# Patient Record
Sex: Male | Born: 1965 | Hispanic: Yes | Smoking: Former smoker
Health system: Southern US, Community
[De-identification: ages and names within clinical notes are randomized; demographics above are authoritative.]

## PROBLEM LIST (undated history)

## (undated) DIAGNOSIS — J439 Emphysema, unspecified: Secondary | ICD-10-CM

## (undated) DIAGNOSIS — M19042 Primary osteoarthritis, left hand: Secondary | ICD-10-CM

## (undated) DIAGNOSIS — I209 Angina pectoris, unspecified: Secondary | ICD-10-CM

## (undated) DIAGNOSIS — M19041 Primary osteoarthritis, right hand: Secondary | ICD-10-CM

## (undated) DIAGNOSIS — R0600 Dyspnea, unspecified: Secondary | ICD-10-CM

## (undated) DIAGNOSIS — E7800 Pure hypercholesterolemia, unspecified: Secondary | ICD-10-CM

## (undated) DIAGNOSIS — J45909 Unspecified asthma, uncomplicated: Secondary | ICD-10-CM

## (undated) DIAGNOSIS — C61 Malignant neoplasm of prostate: Secondary | ICD-10-CM

## (undated) DIAGNOSIS — M1990 Unspecified osteoarthritis, unspecified site: Secondary | ICD-10-CM

## (undated) DIAGNOSIS — E785 Hyperlipidemia, unspecified: Secondary | ICD-10-CM

## (undated) DIAGNOSIS — Z87891 Personal history of nicotine dependence: Secondary | ICD-10-CM

## (undated) DIAGNOSIS — J449 Chronic obstructive pulmonary disease, unspecified: Secondary | ICD-10-CM

## (undated) DIAGNOSIS — K219 Gastro-esophageal reflux disease without esophagitis: Secondary | ICD-10-CM

## (undated) DIAGNOSIS — R7303 Prediabetes: Secondary | ICD-10-CM

## (undated) DIAGNOSIS — J189 Pneumonia, unspecified organism: Secondary | ICD-10-CM

## (undated) HISTORY — DX: Gastro-esophageal reflux disease without esophagitis: K21.9

## (undated) HISTORY — DX: Personal history of nicotine dependence: Z87.891

## (undated) HISTORY — DX: Primary osteoarthritis, left hand: M19.042

## (undated) HISTORY — PX: HEMORRHOID SURGERY: SHX153

## (undated) HISTORY — DX: Unspecified osteoarthritis, unspecified site: M19.90

## (undated) HISTORY — DX: Chronic obstructive pulmonary disease, unspecified: J44.9

---

## 1983-07-18 HISTORY — PX: HEMORRHOID SURGERY: SHX153

## 2005-11-16 ENCOUNTER — Ambulatory Visit: Payer: Self-pay | Admitting: *Deleted

## 2005-11-16 ENCOUNTER — Ambulatory Visit: Payer: Self-pay | Admitting: Family Medicine

## 2006-11-12 ENCOUNTER — Ambulatory Visit: Payer: Self-pay | Admitting: Family Medicine

## 2007-04-03 ENCOUNTER — Encounter (INDEPENDENT_AMBULATORY_CARE_PROVIDER_SITE_OTHER): Payer: Self-pay | Admitting: *Deleted

## 2007-07-22 ENCOUNTER — Ambulatory Visit: Payer: Self-pay | Admitting: Internal Medicine

## 2007-07-22 ENCOUNTER — Encounter (INDEPENDENT_AMBULATORY_CARE_PROVIDER_SITE_OTHER): Payer: Self-pay | Admitting: Family Medicine

## 2008-01-24 ENCOUNTER — Emergency Department (HOSPITAL_COMMUNITY): Admission: EM | Admit: 2008-01-24 | Discharge: 2008-01-24 | Payer: Self-pay | Admitting: Emergency Medicine

## 2008-03-17 ENCOUNTER — Ambulatory Visit: Payer: Self-pay | Admitting: Internal Medicine

## 2008-03-17 ENCOUNTER — Encounter (INDEPENDENT_AMBULATORY_CARE_PROVIDER_SITE_OTHER): Payer: Self-pay | Admitting: Family Medicine

## 2008-03-31 ENCOUNTER — Ambulatory Visit: Payer: Self-pay | Admitting: Internal Medicine

## 2008-04-15 ENCOUNTER — Ambulatory Visit: Payer: Self-pay | Admitting: Internal Medicine

## 2008-09-02 ENCOUNTER — Encounter (INDEPENDENT_AMBULATORY_CARE_PROVIDER_SITE_OTHER): Payer: Self-pay | Admitting: Adult Health

## 2008-09-02 ENCOUNTER — Ambulatory Visit: Payer: Self-pay | Admitting: Internal Medicine

## 2008-09-02 LAB — CONVERTED CEMR LAB
ALT: 34 units/L (ref 0–53)
AST: 25 units/L (ref 0–37)
Albumin: 4.9 g/dL (ref 3.5–5.2)
BUN: 19 mg/dL (ref 6–23)
Basophils Relative: 1 % (ref 0–1)
CO2: 25 meq/L (ref 19–32)
Calcium: 9.7 mg/dL (ref 8.4–10.5)
Chloride: 105 meq/L (ref 96–112)
Cholesterol: 217 mg/dL — ABNORMAL HIGH (ref 0–200)
Creatinine, Ser: 0.86 mg/dL (ref 0.40–1.50)
HCT: 47.3 % (ref 39.0–52.0)
HDL: 47 mg/dL (ref >39)
Lymphs Abs: 1.7 10*3/uL (ref 0.7–4.0)
MCV: 91.8 fL (ref 78.0–100.0)
PSA: 0.45 ng/mL (ref 0.10–4.00)
Platelets: 175 10*3/uL (ref 150–400)
Potassium: 4.8 meq/L (ref 3.5–5.3)
RDW: 13.3 % (ref 11.5–15.5)
Total CHOL/HDL Ratio: 4.6

## 2008-09-07 ENCOUNTER — Ambulatory Visit (HOSPITAL_COMMUNITY): Admission: RE | Admit: 2008-09-07 | Discharge: 2008-09-07 | Payer: Self-pay | Admitting: Adult Health

## 2009-01-08 ENCOUNTER — Emergency Department (HOSPITAL_COMMUNITY): Admission: EM | Admit: 2009-01-08 | Discharge: 2009-01-08 | Payer: Self-pay | Admitting: Emergency Medicine

## 2009-03-05 ENCOUNTER — Ambulatory Visit: Payer: Self-pay | Admitting: Family Medicine

## 2009-03-05 ENCOUNTER — Telehealth (INDEPENDENT_AMBULATORY_CARE_PROVIDER_SITE_OTHER): Payer: Self-pay | Admitting: *Deleted

## 2009-03-10 ENCOUNTER — Ambulatory Visit: Payer: Self-pay | Admitting: Internal Medicine

## 2009-03-10 ENCOUNTER — Encounter (INDEPENDENT_AMBULATORY_CARE_PROVIDER_SITE_OTHER): Payer: Self-pay | Admitting: Internal Medicine

## 2009-03-10 LAB — CONVERTED CEMR LAB
Hemoglobin: 14.7 g/dL (ref 13.0–17.0)
Lymphs Abs: 1.6 10*3/uL (ref 0.7–4.0)
Monocytes Absolute: 0.5 10*3/uL (ref 0.1–1.0)
Monocytes Relative: 10 % (ref 3–12)
Neutro Abs: 3.3 10*3/uL (ref 1.7–7.7)
Neutrophils Relative %: 59 % (ref 43–77)
RBC: 4.74 M/uL (ref 4.22–5.81)
WBC: 5.5 10*3/uL (ref 4.0–10.5)

## 2009-03-11 ENCOUNTER — Encounter (INDEPENDENT_AMBULATORY_CARE_PROVIDER_SITE_OTHER): Payer: Self-pay | Admitting: Internal Medicine

## 2010-03-18 ENCOUNTER — Encounter: Admission: RE | Admit: 2010-03-18 | Discharge: 2010-03-18 | Payer: Self-pay | Admitting: General Practice

## 2010-04-14 ENCOUNTER — Encounter
Admission: RE | Admit: 2010-04-14 | Discharge: 2010-05-10 | Payer: Self-pay | Source: Home / Self Care | Admitting: Unknown Physician Specialty

## 2010-07-18 ENCOUNTER — Emergency Department (HOSPITAL_COMMUNITY)
Admission: EM | Admit: 2010-07-18 | Discharge: 2010-07-18 | Payer: Self-pay | Source: Home / Self Care | Admitting: Emergency Medicine

## 2010-07-29 ENCOUNTER — Emergency Department (HOSPITAL_COMMUNITY)
Admission: EM | Admit: 2010-07-29 | Discharge: 2010-07-29 | Payer: Self-pay | Source: Home / Self Care | Admitting: Emergency Medicine

## 2010-10-24 LAB — POCT I-STAT, CHEM 8
BUN: 19 mg/dL (ref 6–23)
Chloride: 105 mEq/L (ref 96–112)
Creatinine, Ser: 1.1 mg/dL (ref 0.4–1.5)
Potassium: 4 mEq/L (ref 3.5–5.1)
Sodium: 140 mEq/L (ref 135–145)
TCO2: 27 mmol/L (ref 0–100)

## 2011-03-18 ENCOUNTER — Emergency Department (HOSPITAL_COMMUNITY)
Admission: EM | Admit: 2011-03-18 | Discharge: 2011-03-18 | Disposition: A | Discharge status: Home / Self Care | Payer: Self-pay | Attending: Emergency Medicine | Admitting: Emergency Medicine

## 2011-03-18 ENCOUNTER — Emergency Department (HOSPITAL_COMMUNITY): Payer: Self-pay

## 2011-03-18 DIAGNOSIS — M25569 Pain in unspecified knee: Secondary | ICD-10-CM | POA: Insufficient documentation

## 2011-03-18 DIAGNOSIS — E7800 Pure hypercholesterolemia, unspecified: Secondary | ICD-10-CM | POA: Insufficient documentation

## 2015-06-13 ENCOUNTER — Ambulatory Visit (HOSPITAL_COMMUNITY)
Admission: RE | Admit: 2015-06-13 | Discharge: 2015-06-13 | Disposition: A | Discharge status: Home / Self Care | Payer: Self-pay | Source: Ambulatory Visit | Attending: Emergency Medicine | Admitting: Emergency Medicine

## 2015-06-13 ENCOUNTER — Telehealth: Payer: Self-pay | Admitting: *Deleted

## 2015-06-13 ENCOUNTER — Ambulatory Visit (INDEPENDENT_AMBULATORY_CARE_PROVIDER_SITE_OTHER): Payer: Self-pay | Admitting: Emergency Medicine

## 2015-06-13 ENCOUNTER — Ambulatory Visit (INDEPENDENT_AMBULATORY_CARE_PROVIDER_SITE_OTHER): Payer: Self-pay

## 2015-06-13 VITALS — BP 118/62 | HR 77 | Temp 98.5°F | Resp 16 | Ht 73.0 in | Wt 183.0 lb

## 2015-06-13 DIAGNOSIS — R112 Nausea with vomiting, unspecified: Secondary | ICD-10-CM

## 2015-06-13 DIAGNOSIS — R197 Diarrhea, unspecified: Secondary | ICD-10-CM | POA: Insufficient documentation

## 2015-06-13 DIAGNOSIS — R1012 Left upper quadrant pain: Secondary | ICD-10-CM

## 2015-06-13 LAB — AMYLASE: AMYLASE: 35 U/L (ref 0–105)

## 2015-06-13 LAB — POCT CBC
Granulocyte percent: 68.4 %G (ref 37–80)
HCT, POC: 44.4 % (ref 43.5–53.7)
HEMOGLOBIN: 15.7 g/dL (ref 14.1–18.1)
LYMPH, POC: 2 (ref 0.6–3.4)
MCH, POC: 31.8 pg — AB (ref 27–31.2)
MCHC: 35.3 g/dL (ref 31.8–35.4)
MCV: 90 fL (ref 80–97)
MID (cbc): 0.7 (ref 0–0.9)
MPV: 7.7 fL (ref 0–99.8)
POC Granulocyte: 5.9 (ref 2–6.9)
POC LYMPH PERCENT: 23 %L (ref 10–50)
POC MID %: 8.6 %M (ref 0–12)
Platelet Count, POC: 164 10*3/uL (ref 142–424)
RBC: 4.93 M/uL (ref 4.69–6.13)
RDW, POC: 12.5 %
WBC: 8.6 10*3/uL (ref 4.6–10.2)

## 2015-06-13 LAB — POCT URINALYSIS DIP (MANUAL ENTRY)
Glucose, UA: NEGATIVE
Ketones, POC UA: NEGATIVE
LEUKOCYTES UA: NEGATIVE
NITRITE UA: NEGATIVE
PH UA: 5.5
Spec Grav, UA: 1.025
UROBILINOGEN UA: 0.2

## 2015-06-13 LAB — POC MICROSCOPIC URINALYSIS (UMFC)

## 2015-06-13 LAB — COMPLETE METABOLIC PANEL WITH GFR
ALBUMIN: 4.5 g/dL (ref 3.6–5.1)
ALT: 14 U/L (ref 9–46)
AST: 15 U/L (ref 10–35)
Alkaline Phosphatase: 75 U/L (ref 40–115)
BUN: 12 mg/dL (ref 7–25)
CALCIUM: 8.6 mg/dL (ref 8.6–10.3)
CHLORIDE: 105 mmol/L (ref 98–110)
CO2: 29 mmol/L (ref 20–31)
Creat: 0.88 mg/dL (ref 0.70–1.33)
GFR, Est African American: >89 mL/min (ref >=60)
Glucose, Bld: 79 mg/dL (ref 65–99)
POTASSIUM: 4.1 mmol/L (ref 3.5–5.3)
Sodium: 140 mmol/L (ref 135–146)
Total Bilirubin: 0.6 mg/dL (ref 0.2–1.2)
Total Protein: 7.1 g/dL (ref 6.1–8.1)

## 2015-06-13 LAB — LIPASE: LIPASE: 20 U/L (ref 7–60)

## 2015-06-13 LAB — HIV ANTIBODY (ROUTINE TESTING W REFLEX): HIV: NONREACTIVE

## 2015-06-13 LAB — HEPATITIS C ANTIBODY: HCV Ab: NEGATIVE

## 2015-06-13 MED ORDER — HYOSCYAMINE SULFATE 0.125 MG SL SUBL: 0.1250 mg | SUBLINGUAL_TABLET | Freq: Four times a day (QID) | SUBLINGUAL | Status: DC | PRN

## 2015-06-13 MED ORDER — IOHEXOL 300 MG/ML  SOLN
100.0000 mL | Freq: Once | INTRAMUSCULAR | Status: AC | PRN
  Administered 2015-06-13: 100 mL via INTRAVENOUS

## 2015-06-13 NOTE — Progress Notes (Addendum)
This chart was scribed for Arlyss Queen, MD by Moises Blood, Medical Scribe. This patient was seen in Room 1 and the patient's care was started 12:15 PM.  Chief Complaint:  Chief Complaint  Patient presents with  . Abdominal Pain    x 2 days  . Diarrhea    HPI: Jerry Steele is a 49 y.o. male who reports to [facility] today complaining of abd left flank pain for past 2 days with diarrhea.  He believes he ate something bad, because he has a lot of gas and diarrhea. He had a fever last night. He had diarrhea throughout 2 days ago. His last episode of diarrhea was last night. He had corn tortillas and believed it wasn't cooked very well. He had gags and had last episode of vomiting yesterday. He also notes feeling dizzy, "like having the flu". His last food and liquid was 2 days ago. He denies dysuria. He's rarely sick and is usually healthy most of the time. He mentions shx of hemorrhoids and prostate.   He was recommended for a PCP because he states having prostate cancer.  His previous PCP was his license and closed his practice.   He lives alone. He works for himself.   No past medical history on file. No past surgical history on file. Social History   Social History  . Marital Status: Single    Spouse Name: N/A  . Number of Children: N/A  . Years of Education: N/A   Social History Main Topics  . Smoking status: Never Smoker   . Smokeless tobacco: None  . Alcohol Use: None  . Drug Use: None  . Sexual Activity: Not Asked   Other Topics Concern  . None   Social History Narrative  . None   No family history on file. No Known Allergies Prior to Admission medications   Not on File     ROS:  Constitutional: negative for chills, fever, night sweats, weight changes, or fatigue  HEENT: negative for vision changes, hearing loss, congestion, rhinorrhea, ST, epistaxis, or sinus pressure Cardiovascular: negative for chest pain or palpitations Respiratory: negative for  hemoptysis, wheezing, shortness of breath, or cough Abdominal: negative for nausea, or constipation; positive for abd flank pain (left), vomiting, diarrhea Dermatological: negative for rash Neurologic: negative for headache, or syncope; positive for dizziness All other systems reviewed and are otherwise negative with the exception to those above and in the HPI.  PHYSICAL EXAM: Filed Vitals:   06/13/15 1128  BP: 118/62  Pulse: 77  Temp: 98.5 F (36.9 C)  Resp: 16   Body mass index is 24.15 kg/(m^2).   General: Alert, no acute distress HEENT:  Normocephalic, atraumatic, oropharynx patent. Eye: [facility] Cardiovascular:  Regular rate and rhythm, no rubs murmurs or gallops.  No Carotid bruits, radial pulse intact. No pedal edema.  Respiratory: Clear to auscultation bilaterally.  No wheezes, rales, or rhonchi.  No cyanosis, no use of accessory musculature Abdominal: No organomegaly, abd hyperactive bowel sounds, mild left upper abd discomfort, no rebound Musculoskeletal: Gait intact. No edema, tenderness Skin: No rashes. Neurologic: Facial musculature symmetric. Psychiatric: Patient acts appropriately throughout our interaction.  Lymphatic: No cervical or submandibular lymphadenopathy Genitourinary/Anorectal: No acute findings the prostate is small no nodules are felt.   LABS: Results for orders placed or performed in visit on 06/13/15  POCT CBC  Result Value Ref Range   WBC 8.6 4.6 - 10.2 K/uL   Lymph, poc 2.0 0.6 - 3.4  POC LYMPH PERCENT 23.0 10 - 50 %L   MID (cbc) 0.7 0 - 0.9   POC MID % 8.6 0 - 12 %M   POC Granulocyte 5.9 2 - 6.9   Granulocyte percent 68.4 37 - 80 %G   RBC 4.93 4.69 - 6.13 M/uL   Hemoglobin 15.7 14.1 - 18.1 g/dL   HCT, POC 44.4 43.5 - 53.7 %   MCV 90.0 80 - 97 fL   MCH, POC 31.8 (A) 27 - 31.2 pg   MCHC 35.3 31.8 - 35.4 g/dL   RDW, POC 12.5 %   Platelet Count, POC 164 142 - 424 K/uL   MPV 7.7 0 - 99.8 fL  POCT urinalysis dipstick  Result Value  Ref Range   Color, UA yellow yellow   Clarity, UA clear clear   Glucose, UA negative negative   Bilirubin, UA small (A) negative   Ketones, POC UA negative negative   Spec Grav, UA 1.025    Blood, UA small (A) negative   pH, UA 5.5    Protein Ur, POC trace (A) negative   Urobilinogen, UA 0.2    Nitrite, UA Negative Negative   Leukocytes, UA Negative Negative  POCT Microscopic Urinalysis ([facility])  Result Value Ref Range   WBC,UR,HPF,POC Few (A) None WBC/hpf   RBC,UR,HPF,POC None None RBC/hpf   Bacteria None None, Too numerous to count   Mucus Present (A) Absent   Epithelial Cells, UR Per Microscopy Few (A) None, Too numerous to count cells/hpf     EKG/XRAY:   Primary read interpreted by Dr. Everlene Farrier at [facility]. There are multiple air-fluid levels throughout the large and small bowel ASSESSMENT/PLAN: CT scan did not show any acute abnormality. 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 There are no acute findings noted. There is a good amount of stool and gas in the bowel consistent with his history of diarrhea. Levsin sublingual called in.I personally performed the services described in this documentation, which was scribed in my presence. The recorded information has been reviewed and is accurate.  By signing my name below, I, Moises Blood, attest that this documentation has been prepared under the direction and in the presence of Arlyss Queen, MD. Electronically Signed: Moises Blood, [place]. 06/13/2015 , 12:15 PM .    Gross sideeffects, risk and benefits, and alternatives of medications d/w patient. Patient is aware that all medications have potential sideeffects and we are unable to predict every sideeffect or drug-drug interaction that may occur.  Arlyss Queen MD 06/13/2015 12:15 PM

## 2015-06-13 NOTE — Patient Instructions (Signed)
YOU TO GO OVER TO [facility] AND TELL THEM TO REGISTER YOU AS AN OUTPATIENT FOR A CT SCAN.

## 2015-06-13 NOTE — Telephone Encounter (Signed)
No acute findings on ct.  Mild enlargement of portal vein.  Pt advised, will call when liver tests come back.  Dr. Everlene Farrier talked with pt.

## 2015-06-14 LAB — PSA: PSA: 0.57 ng/mL (ref <=4.00)

## 2015-06-15 ENCOUNTER — Telehealth: Payer: Self-pay

## 2015-06-15 NOTE — Telephone Encounter (Signed)
Pt stopped by the office. He is still having diarrhea. Wants to know what the next step would be

## 2015-06-15 NOTE — Telephone Encounter (Signed)
Pt does not want to come back by today. He will come in first thing in the morning to recheck.

## 2015-06-15 NOTE — Telephone Encounter (Signed)
Please put in an ordering collect a gastrointestinal pathogen by PCR. This will help [place] define where the infection is. I also would be happy to see him tomorrow. If he can go ahead and stop by today and pick up the supplies to collect a specimen that would be great.

## 2015-09-28 ENCOUNTER — Ambulatory Visit: Payer: Self-pay

## 2015-09-28 ENCOUNTER — Ambulatory Visit (INDEPENDENT_AMBULATORY_CARE_PROVIDER_SITE_OTHER): Payer: Self-pay | Admitting: Family Medicine

## 2015-09-28 VITALS — BP 130/62 | HR 79 | Temp 98.3°F | Resp 16 | Ht 71.5 in | Wt 189.0 lb

## 2015-09-28 DIAGNOSIS — J111 Influenza due to unidentified influenza virus with other respiratory manifestations: Secondary | ICD-10-CM

## 2015-09-28 DIAGNOSIS — L723 Sebaceous cyst: Secondary | ICD-10-CM

## 2015-09-28 MED ORDER — PSEUDOEPHEDRINE-GUAIFENESIN ER 120-1200 MG PO TB12: 1.0000 | ORAL_TABLET | Freq: Two times a day (BID) | ORAL | Status: DC | PRN

## 2015-09-28 MED ORDER — HYDROCODONE-HOMATROPINE 5-1.5 MG/5ML PO SYRP: 5.0000 mL | ORAL_SOLUTION | Freq: Three times a day (TID) | ORAL | Status: DC | PRN

## 2015-09-28 NOTE — Progress Notes (Signed)
Subjective:    Patient ID: Jerry Steele, male    DOB: 06/25/1963, 50 y.o.   MRN: [id number] Chief Complaint  Patient presents with  . Cough  . Fever  . Generalized Body Aches    HPI He has started having a lot of cough and congestion.  Felt like he was having chest congestion and hard time taking a deep breath over night due to chest tightness. Did have some fevers o/n and chills.  Hurts all over - myalgias.  Pushing fluids, appetite normal.  Once with diarrhea.   Has not had his flu shot last yr.   Has small soft tissue mass on inside of left arm which has not changed and is slightly different color.  He has a small cyst on his back which has drained before.  Has occ sharp momentary twinge of left chest stinginh - more recently resolved.  No past medical history on file. No past surgical history on file. No current outpatient prescriptions on file prior to visit.   No current facility-administered medications on file prior to visit.   No Known Allergies No family history on file. Social History   Social History  . Marital Status: Single    Spouse Name: N/A  . Number of Children: N/A  . Years of Education: N/A   Social History Main Topics  . Smoking status: Never Smoker   . Smokeless tobacco: Not on file  . Alcohol Use: Not on file  . Drug Use: Not on file  . Sexual Activity: Not on file   Other Topics Concern  . Not on file   Social History Narrative  . No narrative on file    Review of Systems  Constitutional: Positive for fever, chills, diaphoresis, activity change, appetite change and fatigue. Negative for unexpected weight change.  HENT: Positive for congestion, ear pain, hearing loss, postnasal drip, rhinorrhea, sinus pressure, sore throat, trouble swallowing and voice change. Negative for facial swelling and sneezing.   Respiratory: Positive for cough, choking and chest tightness. Negative for shortness of breath and wheezing.   Cardiovascular: Positive  for chest pain. Negative for palpitations and leg swelling.  Gastrointestinal: Positive for nausea and diarrhea. Negative for vomiting, abdominal pain and constipation.  Genitourinary: Negative for dysuria.  Musculoskeletal: Positive for myalgias, back pain and arthralgias. Negative for joint swelling, gait problem, neck pain and neck stiffness.  Skin: Negative for color change and rash.  Allergic/Immunologic: Positive for environmental allergies. Negative for immunocompromised state.  Neurological: Positive for headaches. Negative for syncope.  Hematological: Positive for adenopathy.  Psychiatric/Behavioral: Positive for sleep disturbance.       Objective:  BP 130/62 mmHg  Pulse 79  Temp(Src) 98.3 F (36.8 C)  Resp 16  Ht 5' 11.5" (1.816 m)  Wt 189 lb (85.73 kg)  BMI 26.00 kg/m2  Physical Exam  Constitutional: He is oriented to person, place, and time. He appears well-developed and well-nourished. No distress.  HENT:  Head: Normocephalic and atraumatic.  Right Ear: External ear and ear canal normal. Tympanic membrane is injected and retracted. No middle ear effusion.  Left Ear: External ear normal. Tympanic membrane is not retracted.  No middle ear effusion.  Nose: Mucosal edema and rhinorrhea present. Right sinus exhibits maxillary sinus tenderness. Left sinus exhibits maxillary sinus tenderness.  Mouth/Throat: Uvula is midline and mucous membranes are normal. Posterior oropharyngeal erythema (bright red) present. No oropharyngeal exudate or posterior oropharyngeal edema.  Eyes: Conjunctivae are normal. Right eye exhibits no discharge. Left eye  exhibits no discharge. No scleral icterus.  Neck: Normal range of motion. Neck supple. No thyromegaly present.  Cardiovascular: Normal rate, regular rhythm, normal heart sounds and intact distal pulses.   Pulmonary/Chest: Effort normal and breath sounds normal. No respiratory distress.  Lymphadenopathy:       Head (right side):  Submandibular adenopathy present.       Head (left side): Submandibular adenopathy present.    He has no cervical adenopathy.       Right: No supraclavicular adenopathy present.       Left: No supraclavicular adenopathy present.  Neurological: He is alert and oriented to person, place, and time.  Skin: Skin is warm and dry. He is not diaphoretic. No erythema.     Has 2 well-defined soft, slightly mobile soft tissue masses on above areas, no tenderness, warmth, fluctuance, induration, or overlying skin changes.  Psychiatric: He has a normal mood and affect. His behavior is normal.       Assessment & Plan:   1. Influenza with respiratory manifestation - pt with typical sxs of influenza in the midst of a known outbreak and is self-pay - he is healthy without comorbidities and exam reassuring so no need to test and will treat symptomatically for now - reminded that he is likely to cont to feel worse for the next few days.  2. Sebaceous cyst - pt reassured but he really wants this removed on his back despite its benign nature     Meds ordered this encounter  Medications  . HYDROcodone-homatropine (HYCODAN) 5-1.5 MG/5ML syrup    Sig: Take 5 mLs by mouth every 8 (eight) hours as needed for cough.    Dispense:  120 mL    Refill:  0  . Pseudoephedrine-Guaifenesin [phone] MG TB12    Sig: Take 1 tablet by mouth every 12 (twelve) hours as needed.    Dispense:  14 each    Refill:  1     Delman Cheadle, MD MPH

## 2015-09-28 NOTE — Progress Notes (Signed)
Procedure Note:  The patient was placed in prone position with back exposed to visualize sebaceous cyst. The area was cleaned with an alcohol prep pad and 21mL 2% lidocaine/epinephrine was injected into the tissue. The area was then sterilized with two iodine swabs. A 4cm incision was made using a 15 blade scalpel into the tissue, revealing the connective tissue cyst capsule. The cyst was punctured and thick purulent contents were expressed out. The cyst capsule was removed with forceps, curved iris scissors, a hemostat and the 15 blade scalpel. Once the capsule was removed, the skin was placed together using 5-0 P3 Ethilon sutures with two horizontal mattress stiches. A non-adherent bandage and hypafix dressing was placed on top.

## 2015-09-28 NOTE — Patient Instructions (Addendum)
IF you received an x-ray today, you will receive an invoice from [facility]. Please contact [facility] at [phone] with questions or concerns regarding your invoice.   IF you received labwork today, you will receive an invoice from [employer]. Please contact [facility] at [phone] with questions or concerns regarding your invoice.   Our billing staff will not be able to assist you with questions regarding bills from these companies.  You will be contacted with the lab results as soon as they are available. The fastest way to get your results is to activate your My Chart account. Instructions are located on the last page of this paperwork. If you have not heard from [place] regarding the results in 2 weeks, please contact this office.  It is going to be 4-5 days until you start feeling better and this will likely last for 7-10 days.  I recommend frequent warm salt water gargles, hot tea with honey and lemon, rest, and handwashing.  Hot showers or breathing in steam may help loosen the congestion.  Try a netti pot or sinus rinse is also likely to help you feel better and keep this from progressing.   Influenza, Adult Influenza ("the flu") is a viral infection of the respiratory tract. It occurs more often in winter months because people spend more time in close contact with one another. Influenza can make you feel very sick. Influenza easily spreads from person to person (contagious). CAUSES  Influenza is caused by a virus that infects the respiratory tract. You can catch the virus by breathing in droplets from an infected person's cough or sneeze. You can also catch the virus by touching something that was recently contaminated with the virus and then touching your mouth, nose, or eyes. RISKS AND COMPLICATIONS You may be at risk for a more severe case of influenza if you smoke cigarettes, have diabetes, have chronic heart disease (such as heart failure)  or lung disease (such as asthma), or if you have a weakened immune system. Elderly people and pregnant women are also at risk for more serious infections. The most common problem of influenza is a lung infection (pneumonia). Sometimes, this problem can require emergency medical care and may be life threatening. SIGNS AND SYMPTOMS  Symptoms typically last 4 to 10 days and may include:  Fever.  Chills.  Headache, body aches, and muscle aches.  Sore throat.  Chest discomfort and cough.  Poor appetite.  Weakness or feeling tired.  Dizziness.  Nausea or vomiting. DIAGNOSIS  Diagnosis of influenza is often made based on your history and a physical exam. A nose or throat swab test can be done to confirm the diagnosis. TREATMENT  In mild cases, influenza goes away on its own. Treatment is directed at relieving symptoms. For more severe cases, your health care provider may prescribe antiviral medicines to shorten the sickness. Antibiotic medicines are not effective because the infection is caused by a virus, not by bacteria. HOME CARE INSTRUCTIONS  Take medicines only as directed by your health care provider.  Use a cool mist humidifier to make breathing easier.  Get plenty of rest until your temperature returns to normal. This usually takes 3 to 4 days.  Drink enough fluid to keep your urine clear or pale yellow.  Cover yourmouth and nosewhen coughing or sneezing,and wash your handswellto prevent thevirusfrom spreading.  Stay homefromwork orschool untilthe fever is gonefor at least 25full day. PREVENTION  An annual influenza vaccination (flu shot) is the best  way to avoid getting influenza. An annual flu shot is now routinely recommended for all adults in the [place] IF:  You experiencechest pain, yourcough worsens,or you producemore mucus.  Youhave nausea,vomiting, ordiarrhea.  Your fever returns or gets worse. SEEK IMMEDIATE MEDICAL CARE  IF:  You havetrouble breathing, you become short of breath,or your skin ornails becomebluish.  You have severe painor stiffnessin the neck.  You develop a sudden headache, or pain in the face or ear.  You have nausea or vomiting that you cannot control. MAKE SURE YOU:   Understand these instructions.  Will watch your condition.  Will get help right away if you are not doing well or get worse.   This information is not intended to replace advice given to you by your health care provider. Make sure you discuss any questions you have with your health care provider.   Document Released: 06/30/2000 Document Revised: 07/24/2014 Document Reviewed: 10/02/2011 Elsevier Interactive Patient Education 2016 [employer].   Sebaceous Cyst Removal Sebaceous cyst removal is a procedure to remove a sac of oily material that forms under your skin (sebaceous cyst). Sebaceous cysts may also be called epidermoid cysts or keratin cysts. Normally, the skin secretes this oily material through a gland or a hair follicle. This type of cyst usually results when a skin gland or hair follicle becomes blocked. You may need this procedure if you have a sebaceous cyst that becomes large, uncomfortable, or infected. LET [facility] CARE PROVIDER KNOW ABOUT:  Any allergies you have.  All medicines you are taking, including vitamins, herbs, eye drops, creams, and over-the-counter medicines.  Previous problems you or members of your family have had with the use of anesthetics.  Any blood disorders you have.  Previous surgeries you have had.  Medical conditions you have. RISKS AND COMPLICATIONS Generally, this is a safe procedure. However, problems may occur, including:  Developing another cyst.  Bleeding.  Infection.  Scarring. BEFORE THE PROCEDURE  Ask your health care provider about:  Changing or stopping your regular medicines. This is especially important if you are taking diabetes  medicines or blood thinners.  Taking medicines such as aspirin and ibuprofen. These medicines can thin your blood. Do not take these medicines before your procedure if your health care provider instructs you not to.  If you have an infected cyst, you may have to take antibiotic medicines before or after the cyst removal. Take your antibiotics as directed by your health care provider. Finish all of the medicine even if you start to feel better.  Take a shower on the morning of your procedure. Your health care provider may ask you to use a germ-killing (antiseptic) soap. PROCEDURE  You will be given a medicine that numbs the area (local anesthetic).  The skin around the cyst will be cleaned with a germ-killing solution (antiseptic).  Your health care provider will make a small surgical incision over the cyst.  The cyst will be separated from the surrounding tissues that are under your skin.  If possible, the cyst will be removed undamaged (intact).  If the cyst bursts (ruptures), it will need to be removed in pieces.  After the cyst is removed, your health care provider will control any bleeding and close the incision with small stitches (sutures). Small incisions may not need sutures, and the bleeding will be controlled by applying direct pressure with gauze.  Your health care provider may apply antibiotic ointment and a light bandage (dressing) over the  incision. This procedure may vary among health care providers and hospitals. AFTER THE PROCEDURE  If your cyst ruptured during surgery, you may need to take antibiotic medicine. If you were prescribed an antibiotic medicine, finish all of it even if you start to feel better.   This information is not intended to replace advice given to you by your health care provider. Make sure you discuss any questions you have with your health care provider.   Document Released: 06/30/2000 Document Revised: 07/24/2014 Document Reviewed:  03/18/2014 Elsevier Interactive Patient Education [employer].

## 2015-10-04 ENCOUNTER — Ambulatory Visit (INDEPENDENT_AMBULATORY_CARE_PROVIDER_SITE_OTHER): Payer: Self-pay | Admitting: Internal Medicine

## 2015-10-04 VITALS — BP 110/80 | HR 59 | Temp 98.1°F | Resp 17 | Ht 73.0 in | Wt 185.1 lb

## 2015-10-04 DIAGNOSIS — J4520 Mild intermittent asthma, uncomplicated: Secondary | ICD-10-CM

## 2015-10-04 DIAGNOSIS — J0100 Acute maxillary sinusitis, unspecified: Secondary | ICD-10-CM

## 2015-10-04 MED ORDER — PREDNISONE 20 MG PO TABS: ORAL_TABLET | ORAL | Status: DC

## 2015-10-04 MED ORDER — AMOXICILLIN 875 MG PO TABS: 875.0000 mg | ORAL_TABLET | Freq: Two times a day (BID) | ORAL | Status: DC

## 2015-10-04 NOTE — Progress Notes (Signed)
   Subjective:  By signing my name below, I, Jerry Steele, attest that this documentation has been prepared under the direction and in the presence of [name] P. Jerry Pastor, MD. Electronically Signed: Doran Steele, ED Scribe. 10/04/2015. 5:59 PM.   Patient ID: Jerry Steele, male    DOB: 06/25/1963, 50 y.o.   MRN: [id number] Chief Complaint  Patient presents with  . Cough    constant x 1 week. Completed meds from last visit-no help  . Suture / Staple Removal    on lower back   HPI HPI Comments: Jerry Steele is a 49 y.o. male who presents to the [facility] complaining of a cough that began 1 week ago. Pt was seen by a provider for these symptoms and was given medication but he notes no improvement. He states his symptoms began as a dry cough but when he lies down he sometimes has episodes where he has difficulty breathing which then ends in a coughing fit. Pt denies any fevers or any other symptoms at this time.  Pt also reports he is here for a suture/staple removal.  Review of Systems  Respiratory: Positive for cough and shortness of breath.       Objective:   Physical Exam  Constitutional: He is oriented to person, place, and time. He appears well-developed and well-nourished.  HENT:  Head: Normocephalic and atraumatic.  Purulent nasal discharge  Cardiovascular: Normal rate.   Pulmonary/Chest: Effort normal.  Wheezing on forced expiration bilaterally.   Abdominal: He exhibits no distension.  Neurological: He is alert and oriented to person, place, and time.  Skin: Skin is warm and dry.  Psychiatric: He has a normal mood and affect.  Nursing note and vitals reviewed. BP 110/80 mmHg  Pulse 59  Temp(Src) 98.1 F (36.7 C) (Oral)  Resp 17  Ht 6\' 1"  (1.854 m)  Wt 185 lb 2 oz (83.972 kg)  BMI 24.43 kg/m2  SpO2 97%  sut remov from I/D back--well healing w/out infec     Assessment & Plan:  Acute maxillary sinusitis, recurrence not  specified  RAD (reactive airway disease), mild intermittent, uncomplicated  Meds ordered this encounter  Medications  . amoxicillin (AMOXIL) 875 MG tablet    Sig: Take 1 tablet (875 mg total) by mouth 2 (two) times daily.    Dispense:  20 tablet    Refill:  0  . predniSONE (DELTASONE) 20 MG tablet    Sig: 3/3/2/2/1/1 single daily dose for 6 days    Dispense:  12 tablet    Refill:  0    I have completed the patient encounter in its entirety as documented by the scribe, with editing by me where necessary. Elexus Barman P. Jerry Pastor, M.D.

## 2015-10-04 NOTE — Patient Instructions (Signed)
     IF you received an x-ray today, you will receive an invoice from [facility]. Please contact [facility] at [phone] with questions or concerns regarding your invoice.   IF you received labwork today, you will receive an invoice from [facility]. Please contact [facility] at [phone] with questions or concerns regarding your invoice.   Our billing staff will not be able to assist you with questions regarding bills from these companies.  You will be contacted with the lab results as soon as they are available. The fastest way to get your results is to activate your My Chart account. Instructions are located on the last page of this paperwork. If you have not heard from us regarding the results in 2 weeks, please contact this office.      

## 2015-10-08 ENCOUNTER — Telehealth: Payer: Self-pay | Admitting: *Deleted

## 2015-10-08 ENCOUNTER — Ambulatory Visit
Admission: RE | Admit: 2015-10-08 | Discharge: 2015-10-08 | Disposition: A | Discharge status: Home / Self Care | Payer: No Typology Code available for payment source | Source: Ambulatory Visit | Attending: Family Medicine | Admitting: Family Medicine

## 2015-10-08 ENCOUNTER — Ambulatory Visit (INDEPENDENT_AMBULATORY_CARE_PROVIDER_SITE_OTHER): Payer: Self-pay

## 2015-10-08 ENCOUNTER — Ambulatory Visit (INDEPENDENT_AMBULATORY_CARE_PROVIDER_SITE_OTHER): Payer: Self-pay | Admitting: Family Medicine

## 2015-10-08 VITALS — BP 118/78 | HR 58 | Temp 97.7°F | Resp 16 | Ht 73.0 in | Wt 188.0 lb

## 2015-10-08 DIAGNOSIS — R1031 Right lower quadrant pain: Secondary | ICD-10-CM

## 2015-10-08 DIAGNOSIS — K4090 Unilateral inguinal hernia, without obstruction or gangrene, not specified as recurrent: Secondary | ICD-10-CM

## 2015-10-08 DIAGNOSIS — K5900 Constipation, unspecified: Secondary | ICD-10-CM

## 2015-10-08 DIAGNOSIS — J111 Influenza due to unidentified influenza virus with other respiratory manifestations: Secondary | ICD-10-CM

## 2015-10-08 LAB — POC MICROSCOPIC URINALYSIS (UMFC): MUCUS RE: ABSENT

## 2015-10-08 LAB — POCT URINALYSIS DIP (MANUAL ENTRY)
BILIRUBIN UA: NEGATIVE
Blood, UA: NEGATIVE
GLUCOSE UA: NEGATIVE
Ketones, POC UA: NEGATIVE
LEUKOCYTES UA: NEGATIVE
NITRITE UA: NEGATIVE
Protein Ur, POC: NEGATIVE
Spec Grav, UA: 1.015
Urobilinogen, UA: 0.2
pH, UA: 5.5

## 2015-10-08 LAB — POCT CBC
Granulocyte percent: 61 %G (ref 37–80)
HCT, POC: 40.5 % — AB (ref 43.5–53.7)
Hemoglobin: 14.4 g/dL (ref 14.1–18.1)
Lymph, poc: 2 (ref 0.6–3.4)
MCH: 31.6 pg — AB (ref 27–31.2)
MCHC: 35.5 g/dL — AB (ref 31.8–35.4)
MCV: 89 fL (ref 80–97)
MID (CBC): 0.6 (ref 0–0.9)
MPV: 7.7 fL (ref 0–99.8)
POC Granulocyte: 4.1 (ref 2–6.9)
POC LYMPH PERCENT: 29.3 %L (ref 10–50)
POC MID %: 9.7 % (ref 0–12)
Platelet Count, POC: 185 10*3/uL (ref 142–424)
RBC: 4.55 M/uL — AB (ref 4.69–6.13)
RDW, POC: 12.7 %
WBC: 6.7 10*3/uL (ref 4.6–10.2)

## 2015-10-08 LAB — POCT SEDIMENTATION RATE: POCT SED RATE: 14 mm/h (ref 0–22)

## 2015-10-08 MED ORDER — KETOROLAC TROMETHAMINE 60 MG/2ML IM SOLN
60.0000 mg | Freq: Once | INTRAMUSCULAR | Status: AC
  Administered 2015-10-08: 60 mg via INTRAMUSCULAR

## 2015-10-08 MED ORDER — ONDANSETRON 4 MG PO TBDP
4.0000 mg | ORAL_TABLET | Freq: Once | ORAL | Status: AC
  Administered 2015-10-08: 4 mg via ORAL

## 2015-10-08 MED ORDER — IOPAMIDOL (ISOVUE-300) INJECTION 61%
100.0000 mL | Freq: Once | INTRAVENOUS | Status: AC | PRN
  Administered 2015-10-08: 100 mL via INTRAVENOUS

## 2015-10-08 MED ORDER — POLYETHYLENE GLYCOL 3350 17 GM/SCOOP PO POWD: 17.0000 g | Freq: Two times a day (BID) | ORAL | Status: DC

## 2015-10-08 NOTE — Progress Notes (Signed)
Subjective:    Patient ID: Jerry Steele, male    DOB: 06/25/1963, 50 y.o.   MRN: [id number] By signing my name below, I, Zola Button, attest that this documentation has been prepared under the direction and in the presence of Delman Cheadle, MD.  Electronically Signed: Zola Button, Medical Scribe. 10/08/2015. 10:11 AM.  Chief Complaint  Patient presents with  . Abdominal Pain    lower right, radiates to back and down to groin    HPI HPI Comments: Jerry Steele is a 50 y.o. male who presents to the [facility] complaining of constant RLQ abdominal pain radiating to the back and testicle that started yesterday. Patient did have some dysuria last night. He also reports having associated constipation, blood in stool, and feeling gassy. He thinks he may have had a fever last night, but he is unsure. He ate normally last night. Patient denies urinary frequency, diarrhea, pain with bowel movements, nausea and vomiting. He does not feel a bulge. His urine has appeared normal.  He was seen here by Dr. Everlene Farrier 4 months ago for abdominal pain with vomiting and diarrhea. He had a CT scan at that time that showed a mildly prominent 1.7 cm diameter, normal appendix, and moderate stool and gas. His current pain feels different.  Patient was seen here 4 days ago by Dr. Laney Pastor for acute maxillary sinusitis. He still has a cough. He also feels difficulty taking deep breaths. He has been taking the cough medication but without relief.  No past medical history on file. No past surgical history on file. No current outpatient prescriptions on file prior to visit.   No current facility-administered medications on file prior to visit.   No Known Allergies No family history on file. Social History   Social History  . Marital Status: Single    Spouse Name: N/A  . Number of Children: N/A  . Years of Education: N/A   Social History Main Topics  . Smoking status: Never  Smoker   . Smokeless tobacco: None  . Alcohol Use: None  . Drug Use: None  . Sexual Activity: Not Asked   Other Topics Concern  . None   Social History Narrative    Review of Systems  Constitutional: Negative for appetite change.  Respiratory: Positive for cough.   Gastrointestinal: Positive for abdominal pain, constipation and blood in stool. Negative for nausea, vomiting, diarrhea and rectal pain.  Genitourinary: Positive for dysuria and testicular pain. Negative for frequency.  Musculoskeletal: Positive for back pain.       Objective:  BP 118/78 mmHg  Pulse 58  Temp(Src) 97.7 F (36.5 C)  Resp 16  Ht 6\' 1"  (1.854 m)  Wt 188 lb (85.276 kg)  BMI 24.81 kg/m2  SpO2 98%  Physical Exam  Constitutional: He is oriented to person, place, and time. He appears well-developed and well-nourished. No distress.  HENT:  Head: Normocephalic and atraumatic.  Mouth/Throat: Oropharynx is clear and moist. No oropharyngeal exudate.  Eyes: Pupils are equal, round, and reactive to light.  Neck: Neck supple.  Cardiovascular: Normal rate.   Pulmonary/Chest: Effort normal. He has wheezes. He has rales.  Inspiratory rales and expiratory wheezes, right lower lobe.  Abdominal: Soft. He exhibits no distension. There is no hepatosplenomegaly. There is tenderness. There is tenderness at McBurney's point. There is no rebound, no guarding, no CVA tenderness and negative Murphy's sign. A hernia is present. Hernia confirmed positive in the right inguinal area.  Hyperactive bowel  sounds. Referred pain from left lower quadrant to right.  Genitourinary: Testes normal.  Normal testicle exam. Positive indirect right inguinal hernia.  Musculoskeletal: He exhibits no edema.  Neurological: He is alert and oriented to person, place, and time. No cranial nerve deficit.  Skin: Skin is warm and dry. No rash noted.  Psychiatric: He has a normal mood and affect. His behavior is normal.  Nursing note and vitals  reviewed.  Ct Abdomen Pelvis W Contrast  10/08/2015  CLINICAL DATA:  Right sided pain for 2 days, patient was picking up a dog crate and felt something tear in right sided pelvic area Right inguinal hernia Constipation Hx of prostate cancer with chemo, prev in pacs, hx of kidney stones EXAM: CT ABDOMEN AND PELVIS WITH CONTRAST TECHNIQUE: Multidetector CT imaging of the abdomen and pelvis was performed using the standard protocol following bolus administration of intravenous contrast. CONTRAST:  125mL ISOVUE-300 IOPAMIDOL (ISOVUE-300) INJECTION 61% COMPARISON:  None. FINDINGS: Lower chest: Lung bases are clear. Hepatobiliary: No focal hepatic lesion. No biliary duct dilatation. Gallbladder is normal. Common bile duct is normal. Pancreas: Pancreas is normal. No ductal dilatation. No pancreatic inflammation. Spleen: Normal spleen Adrenals/urinary tract: Adrenal glands and kidneys are normal. The ureters and bladder normal. Stomach/Bowel: Stomach, small bowel, appendix, and cecum are normal. The colon and rectosigmoid colon are normal. Vascular/Lymphatic: Abdominal aorta is normal caliber. There is no retroperitoneal or periportal lymphadenopathy. No pelvic lymphadenopathy. Reproductive: Normal prostate Other: Small fat filled inguinal hernias. Musculoskeletal: No aggressive osseous lesion. IMPRESSION: 1. No explanation for RIGHT-sided pain. 2. Small fat filled inguinal hernias. Electronically Signed   By: Suzy Bouchard M.D.   On: 10/08/2015 15:36   Dg Abd Acute W/chest  10/08/2015  CLINICAL DATA:  Right lower quadrant pain and suprapubic pain radiating into the right groin and testicle. EXAM: DG ABDOMEN ACUTE W/ 1V CHEST COMPARISON:  None. FINDINGS: There is no evidence of dilated bowel loops or free intraperitoneal air. There are multiple calcifications in the pelvis, most likely phleboliths. Heart size and mediastinal contours are within normal limits. Both lungs are clear. IMPRESSION: Calcifications in the  pelvis most likely phleboliths. Otherwise benign appearing abdomen. Electronically Signed   By: Lorriane Shire M.D.   On: 10/08/2015 11:08       Assessment & Plan:   1. Suprapubic pain, acute, right   2. Influenza with respiratory manifestation   3. Right inguinal hernia   4. Constipation, unspecified constipation type     Orders Placed This Encounter  Procedures  . DG Abd Acute W/Chest    Standing Status: Future     Number of Occurrences: 1     Standing Expiration Date: 10/07/2016    Order Specific Question:  Reason for exam:    Answer:  right lower quadrant pain radiating to testicle, suspect right inguinal heria    Order Specific Question:  Preferred imaging location?    Answer:  External  . CT Abdomen Pelvis W Contrast    WT-188/NOT DIAB/NO KIDNEY PROBLEMS/ NO NEEDS/NKDA TO CT IV/ INS-SELF PAY [facility] [phone] EPIC ORDER OFFICE TO GIVE PT ORAL CM    Standing Status: Future     Number of Occurrences: 1     Standing Expiration Date: 01/07/2017    Order Specific Question:  If indicated for the ordered procedure, I authorize the administration of contrast media per Radiology protocol    Answer:  Yes    Order Specific Question:  Reason for Exam (SYMPTOM  OR DIAGNOSIS REQUIRED)  Answer:  concern for incarcerated right inguinal hernia    Order Specific Question:  Preferred imaging location?    Answer:  External  . Ambulatory referral to General Surgery    Referral Priority:  Urgent    Referral Type:  Surgical    Referral Reason:  Specialty Services Required    Requested Specialty:  General Surgery    Number of Visits Requested:  1  . POCT CBC  . POCT urinalysis dipstick  . POCT Microscopic Urinalysis (UM[facility])  . POCT SEDIMENTATION RATE    Meds ordered this encounter  Medications  . ondansetron (ZOFRAN-ODT) disintegrating tablet 4 mg    Sig:   . polyethylene glycol powder (GLYCOLAX/MIRALAX) powder    Sig: Take 17 g by mouth 2 (two) times daily.    Dispense:   500 g    Refill:  1  . ketorolac (TORADOL) injection 60 mg    Sig:     I personally performed the services described in this documentation, which was scribed in my presence. The recorded information has been reviewed and considered, and addended by me as needed.  Delman Cheadle, MD MPH

## 2015-10-08 NOTE — Telephone Encounter (Signed)
lmom about appt at [facility] at 12pm with Dr. Dalbert Batman.  Needs to be there at 1130 with picture id, med list, and $166.60.

## 2015-10-08 NOTE — Patient Instructions (Addendum)
I recommend doing a miralax clean-out. Put 14 (not kidding) doses of miralax (polyethylene glycol) into 64 oz of any clear, non-carbonated liquid (apple juice, gatorade, water) and drink this WITHIN 24 HOURS!!!! For the next 2d, plan to stay home and just hang around the toilet as you will hopefully be having 8 BM/day of LIQUID stool.  If the diarrhea occurs soon after starting process without passing a significant stool volume or if you are having any fecal incontinence you should keep going as this may be the initial miralax washing around the larger stools.      IF you received an x-ray today, you will receive an invoice from [facility]. Please contact [facility] at [phone] with questions or concerns regarding your invoice.   IF you received labwork today, you will receive an invoice from [employer]. Please contact [facility] at [phone] with questions or concerns regarding your invoice.   Our billing staff will not be able to assist you with questions regarding bills from these companies.  You will be contacted with the lab results as soon as they are available. The fastest way to get your results is to activate your My Chart account. Instructions are located on the last page of this paperwork. If you have not heard from [place] regarding the results in 2 weeks, please contact this office.     Hernia inguinal - Adultos  (Inguinal Hernia, Adult)  Los msculos mantienen todos los rganos del cuerpo en Geneticist, molecular. Pero si se produce un punto dbil [employer], algunos pueden protruir. Eso se llama hernia. Cuando esto sucede en la parte inferior del vientre (abdomen), se trata de una hernia inguinal. (Toma su nombre de una parte del cuerpo que en esta regin se llamada canal inguinal). Un punto dbil en la pared de los msculos deja que un poco de grasa o parte del intestino delgado salgan hacia afuera. Una hernia inguinal puede  desarrollarse a cualquier edad. Los hombres la sufren con ms frecuencia que las mujeres.  CAUSAS  En los adultos, la hernia inguinal desarrolla con el tiempo.   Las causas pueden ser:  Un esfuerzo sbito de los msculos de la parte inferior del abdomen.  Levantar objetos pesados.  Dificultad para mover el intestino. La dificultad para mover el intestino (constipacin) puede llevar a una hernia.  Tos constante. La causa puede ser el tabaquismo o una enfermedad pulmonar.  Tener sobrepeso.  El [place]ol[place].  Tener un empleo que requiera Equities trader perodos de pie o levantar objetos pesados.  Haber sufrido de una hernia inguinal anteriormente. En algunos casos puede convertirse en una situacin de Freight forwarder. Cuando esto ocurre, se llama hernia inguinal estrangulada. Se produce cuando una parte del intestino delgado se desliza a travs del punto dbil y no puede volver al abdomen. El flujo de [place] puede interrumpirse. Si esto ocurre, una parte del intestino puede morir. Esta situacin requiere [place].  SNTOMAS  Generalmente una hernia inguinal pequea no tiene sntomas. Se diagnostica cuando un profesional de la salud hace un examen fsico. Las hernias ms grandes generalmente presentan sntomas.   En los adultos, los sntomas incluyen:  Un bulto en la ingle. Es fcil de Hydrographic surveyor cuando la persona est de pie. Puede desaparecer al Pershing Proud.  Los hombres pueden tener un bulto Geographical information systems officer.  Dolor o ardor en la ingle. Esto ocurre especialmente al levantar objetos, realizar un esfuerzo o toser.  Dolor sordo o sensacin de presin  en la ingle.  Los signos de una hernia estrangulada pueden ser:  Ardelia Mems protuberancia en la ingle que duele mucho y est sensible al tacto.  Un bulto que se vuelve de color rojo o prpura.  Cristy Hilts, nuseas y vmitos.  Imposibilidad de evacuar el intestino o de eliminar gases. DIAGNSTICO  Para diagnosticar una hernia  inguinal, el profesional le har un examen fsico.   Incluir preguntas acerca de los sntomas que haya notado.  El mdico palpar el rea de la ingle y le pedir que tosa. Si palpa una hernia inguinal, el mdico podr tratar de deslizarla de nuevo hacia adentro el abdomen.  Por lo general no se necesitan otros estudios. TRATAMIENTO  Los tratamientos Emergency planning/management officer. Dependern del tamao de la hernia. Las opciones incluyen:   Observacin cuidadosa. Esto a menudo se sugiere si la hernia es pequea y usted no ha tenido sntomas.  No se realizar ningn procedimiento mdico excepto que aparezcan sntomas.  Tendr que prestar atencin a los sntomas. Si tiene sntomas, comunquese con su mdico de inmediato.  Ciruga. Se realiza si la hernia es grande o si tiene sntomas.  Ciruga abierta. Por lo general, este es un procedimiento ambulatorio (no tendr que pasar la noche en el hospital). Se realiza un corte (incisin) a travs de la piel de la ingle. La hernia se vuelve a colocar en el interior del abdomen. Luego se repara la zona dbil en los msculos con una herniorrafia o hernioplastia. Herniorrafa: en este tipo de [place], se suturan juntos los msculos dbiles. Hernioplasta: se coloca un parche o malla para cerrar el rea dbil en la pared abdominal.  Laparoscopia. En este procedimiento, el cirujano hace incisiones pequeas. Se coloca en el abdomen un tubo delgado con una pequea cmara de video (llamado laparoscopio). El cirujano repara la hernia con [place], observando en una cmara de vdeo y utilizando dos instrumentos largos. INSTRUCCIONES PARA EL CUIDADO EN EL HOGAR   Despus de la ciruga de reparacin de una hernia inguinal:  Necesitar tomar un analgsico para el dolor recetado por su mdico. Siga cuidadosamente todas las indicaciones.  Tendr que cuidar la herida de la incisin.  Deber restringir algunas actividades por un tiempo. Incluir no levantar objetos pesados  durante varias semanas. Tampoco podr hacer nada demasiado activo durante algunas semanas. La vuelta al [place] depender del tipo de trabajo que tenga.  Durante perodos de "espera vigilante", usted debe:  Mantenga un peso saludable.  Consumir una dieta rica en fibra (frutas, verduras y granos enteros).  Beba gran cantidad de lquidos para evitar la constipacin. Esto significa beber suficiente agua y otros lquidos para mantener la orina clara o de color amarillo plido.  No levante objetos pesados.  No permanezca de pie durante largos perodos.  Deje de fumar. Evite toser con frecuencia. SOLICITE ATENCIN MDICA SI:   Aparece una protuberancia en el rea de la ingle.  Siente dolor, tiene sensacin de [place] o de presin en la ingle. Esto podra empeorar si levanta pesos o hace esfuerzos.  Tiene fiebre de ms de 100.5 F (38.1 C). SOLICITE ATENCIN MDICA DE INMEDIATO SI:   El dolor en la ingle aumenta repentinamente.  Una protuberancia en la ingle se hace ms grande y no baja.  En los hombres, un dolor repentino en el escroto. O el escroto aumenta de tamao.  Un bulto en el rea de la ingle se vuelve de color rojo o prpura y es dolorosa al tacto.  Tiene nuseas o vmitos que no  desaparecen.  Siente que su corazn late mucho ms rpido de lo normal.  No puede mover el intestino o eliminar gases.  Tiene fiebre de ms de 102.0 F (38.9 C).   Esta informacin no tiene Marine scientist el consejo del mdico. Asegrese de hacerle al mdico cualquier pregunta que tenga.   Document Released: 10/28/2012 Elsevier Interactive Patient Education 2016 Elsevier Inc.   Full Liquid Diet A full liquid diet may be used:   To help you transition from a clear liquid diet to a soft diet.   When your body is healing and can only tolerate foods that are easy to digest.  Before or after certain a procedure, test, or surgery (such as stomach or intestinal surgeries).   If  you have trouble swallowing or chewing.  A full liquid diet includes fluids and foods that are liquid or will become liquid at room temperature. The full liquid diet gives you the proteins, fluids, salts, and minerals that you need for energy. If you continue this diet for more than 72 hours, talk to your health care provider about how many calories you need to consume. If you continue the diet for more than 5 days, talk to your health care provider about taking a multivitamin or a nutritional supplement. WHAT DO I NEED TO KNOW ABOUT A FULL LIQUID DIET?  You may have any liquid.  You may have any food that becomes a liquid at room temperature. The food is considered a liquid if it can be poured off a spoon at room temperature.  Drink one serving of citrus or vitamin C-enriched fruit juice daily. WHAT FOODS CAN I EAT? Grains Any grain food that can be pureed in soup (such as crackers, pasta, and rice). Hot cereal (such as farina or oatmeal) that has been blended. Talk to your health care provider or dietitian about these foods. Vegetables Pulp-free tomato or vegetable juice. Vegetables pureed in soup.  Fruits Fruit juice, including nectars and juices with pulp. Meats and Other Protein Sources Eggs in custard, eggnog mix, and eggs used in ice cream or pudding. Strained meats, like in baby food, may be allowed. Consult your health care provider.  Dairy Milk and milk-based beverages, including milk shakes and instant breakfast mixes. Smooth yogurt. Pureed cottage cheese. Avoid these foods if they are not well tolerated. Beverages All beverages, including liquid nutritional supplements. Ask your health care provider if you can have carbonated beverages. They may not be well tolerated. Condiments Iodized salt, pepper, spices, and flavorings. Cocoa powder. Vinegar, ketchup, yellow mustard, smooth sauces (such as hollandaise, cheese sauce, or white sauce), and soy sauce. Sweets and  Desserts Custard, smooth pudding. Flavored gelatin. Tapioca, junket. Plain ice cream, sherbet, fruit ices. Frozen ice pops, frozen fudge pops, pudding pops, and other frozen bars with cream. Syrups, including chocolate syrup. Sugar, honey, jelly.  Fats and Oils Margarine, butter, cream, sour cream, and oils. Other Broth and cream soups. Strained, broth-based soups. The items listed above may not be a complete list of recommended foods or beverages. Contact your dietitian for more options.  WHAT FOODS CAN I NOT EAT? Grains All breads. Grains are not allowed unless they are pureed into soup. Vegetables Vegetables are not allowed unless they are juiced, or cooked and pureed into soup. Fruits Fruits are not allowed unless they are juiced. Meats and Other Protein Sources Any meat or fish. Cooked or raw eggs. Nut butters.  Dairy Cheese.  Condiments Stone ground mustards. Fats and Oils Fats  that are coarse or chunky. Sweets and Desserts Ice cream or other frozen desserts that have any solids in them or on top, such as nuts, chocolate chips, and pieces of cookies. Cakes. Cookies. Candy. Others Soups with chunks or pieces in them. The items listed above may not be a complete list of foods and beverages to avoid. Contact your dietitian for more information.   This information is not intended to replace advice given to you by your health care provider. Make sure you discuss any questions you have with your health care provider.   Document Released: 07/03/2005 Document Revised: 07/08/2013 Document Reviewed: 05/08/2013 Elsevier Interactive Patient Education 2016 [employer]. Constipation, Adult Constipation is when a person has fewer than three bowel movements a week, has difficulty having a bowel movement, or has stools that are dry, hard, or larger than normal. As people grow older, constipation is more common. A low-fiber diet, not taking in enough fluids, and taking certain medicines may  make constipation worse.  CAUSES   Certain medicines, such as antidepressants, pain medicine, iron supplements, antacids, and water pills.   Certain diseases, such as diabetes, irritable bowel syndrome (IBS), thyroid disease, or depression.   Not drinking enough water.   Not eating enough fiber-rich foods.   Stress or travel.   Lack of physical activity or exercise.   Ignoring the urge to have a bowel movement.   Using laxatives too much.  SIGNS AND SYMPTOMS   Having fewer than three bowel movements a week.   Straining to have a bowel movement.   Having stools that are hard, dry, or larger than normal.   Feeling full or bloated.   Pain in the lower abdomen.   Not feeling relief after having a bowel movement.  DIAGNOSIS  Your health care provider will take a medical history and perform a physical exam. Further testing may be done for severe constipation. Some tests may include:  A barium enema X-ray to examine your rectum, colon, and, sometimes, your small intestine.   A sigmoidoscopy to examine your lower colon.   A colonoscopy to examine your entire colon. TREATMENT  Treatment will depend on the severity of your constipation and what is causing it. Some dietary treatments include drinking more fluids and eating more fiber-rich foods. Lifestyle treatments may include regular exercise. If these diet and lifestyle recommendations do not help, your health care provider may recommend taking over-the-counter laxative medicines to help you have bowel movements. Prescription medicines may be prescribed if over-the-counter medicines do not work.  HOME CARE INSTRUCTIONS   Eat foods that have a lot of fiber, such as fruits, vegetables, whole grains, and beans.  Limit foods high in fat and processed sugars, such as french fries, hamburgers, cookies, candies, and soda.   A fiber supplement may be added to your diet if you cannot get enough fiber from foods.    Drink enough fluids to keep your urine clear or pale yellow.   Exercise regularly or as directed by your health care provider.   Go to the restroom when you have the urge to go. Do not hold it.   Only take over-the-counter or prescription medicines as directed by your health care provider. Do not take other medicines for constipation without talking to your health care provider first.  [place] IF:   You have bright red blood in your stool.   Your constipation lasts for more than 4 days or gets worse.   You have abdominal  or rectal pain.   You have thin, pencil-like stools.   You have unexplained weight loss. MAKE SURE YOU:   Understand these instructions.  Will watch your condition.  Will get help right away if you are not doing well or get worse.   This information is not intended to replace advice given to you by your health care provider. Make sure you discuss any questions you have with your health care provider.   Document Released: 03/31/2004 Document Revised: 07/24/2014 Document Reviewed: 04/14/2013 Elsevier Interactive Patient Education [employer].

## 2015-10-09 ENCOUNTER — Telehealth: Payer: Self-pay

## 2015-10-09 NOTE — Telephone Encounter (Signed)
Pt is calling for i believe from a scan not sure could also be lab results -pt had a language barrier  Best number [phone]

## 2015-10-11 ENCOUNTER — Ambulatory Visit (INDEPENDENT_AMBULATORY_CARE_PROVIDER_SITE_OTHER): Payer: Self-pay | Admitting: Family Medicine

## 2015-10-11 VITALS — BP 110/84 | HR 74 | Temp 98.4°F | Resp 16 | Ht 73.0 in | Wt 183.8 lb

## 2015-10-11 DIAGNOSIS — K5900 Constipation, unspecified: Secondary | ICD-10-CM

## 2015-10-11 DIAGNOSIS — K219 Gastro-esophageal reflux disease without esophagitis: Secondary | ICD-10-CM

## 2015-10-11 MED ORDER — LACTULOSE 20 GM/30ML PO SOLN: 30.0000 mL | Freq: Two times a day (BID) | ORAL | Status: DC

## 2015-10-11 MED ORDER — PANTOPRAZOLE SODIUM 40 MG PO TBEC: 40.0000 mg | DELAYED_RELEASE_TABLET | Freq: Every day | ORAL | Status: DC

## 2015-10-11 NOTE — Progress Notes (Addendum)
Subjective:    Patient ID: Jerry Steele, male    DOB: 06/25/1963, 50 y.o.   MRN: [id number] By signing my name below, I, Zola Button, attest that this documentation has been prepared under the direction and in the presence of Delman Cheadle, MD.  Electronically Signed: Zola Button, Medical Scribe. 10/11/2015. 4:09 PM.  Chief Complaint  Patient presents with  . Follow-up    Psot his visit with [facility], brought in the paper   . needs a referral    paper he brought from [place] wrote that he needs a referral for othropedic    HPI HPI Comments: Jerry Steele is a 50 y.o. male who presents to the [facility] for a follow-up for abdominal pain. He still has intermittent abdominal pain and is still feeling gassy. Patient has been taking the Miralax as instructed, but notes he has not had a significant change in his bowel movements. However, he also has not been eating very much. He eats 1 large meal a day and smaller snacks throughout the day. He has noticed blood with 2 of his bowel movements. Patient has frequent heartburn and has also noticed an occasional sharp pain to his chest. He had been put on a prescription medication (a yellow pill) for this by his former PCP which did help with the heartburn, but he does not remember the name of the medication. He has tried OTC medications which have not been effective. Patient does a lot of heavy lifting for work.  Patient did go to [facility] earlier today. They did not think an inguinal hernia was causing his pain and had advised against hernia surgery at that time. They suspected musculoskeletal strain and was sent here for referral for orthopedics.  History reviewed. No pertinent past medical history. History reviewed. No pertinent past surgical history. Current Outpatient Prescriptions on File Prior to Visit  Medication Sig Dispense Refill  . polyethylene glycol powder  (GLYCOLAX/MIRALAX) powder Take 17 g by mouth 2 (two) times daily. (Patient not taking: Reported on 10/11/2015) 500 g 1   No current facility-administered medications on file prior to visit.   No Known Allergies History reviewed. No pertinent family history. Social History   Social History  . Marital Status: Single    Spouse Name: N/A  . Number of Children: N/A  . Years of Education: N/A   Social History Main Topics  . Smoking status: Never Smoker   . Smokeless tobacco: None  . Alcohol Use: None  . Drug Use: None  . Sexual Activity: Not Asked   Other Topics Concern  . None   Social History Narrative    Review of Systems  Constitutional: Positive for appetite change and fatigue. Negative for fever, chills, diaphoresis and activity change.  HENT: Negative for postnasal drip, sore throat and trouble swallowing.   Respiratory: Negative for shortness of breath.   Cardiovascular: Positive for chest pain (from gerd).  Gastrointestinal: Positive for abdominal pain, constipation and anal bleeding. Negative for nausea, vomiting, diarrhea, blood in stool, abdominal distention and rectal pain.  Genitourinary: Negative for dysuria, frequency and decreased urine volume.  Musculoskeletal: Negative for myalgias, back pain, joint swelling, arthralgias and gait problem.  Neurological: Negative for weakness.  Hematological: Negative for adenopathy. Does not bruise/bleed easily.  Psychiatric/Behavioral: Negative for sleep disturbance.       Objective:  BP 110/84 mmHg  Pulse 74  Temp(Src) 98.4 F (36.9 C) (Oral)  Resp 16  Ht 6'  1" (1.854 m)  Wt 183 lb 12.8 oz (83.371 kg)  BMI 24.25 kg/m2  SpO2 97%  Physical Exam  Constitutional: He is oriented to person, place, and time. He appears well-developed and well-nourished. No distress.  HENT:  Head: Normocephalic and atraumatic.  Mouth/Throat: Oropharynx is clear and moist. No oropharyngeal exudate.  Eyes: Pupils are equal, round, and  reactive to light.  Neck: Neck supple.  Cardiovascular: Normal rate.   Pulmonary/Chest: Effort normal.  Abdominal: Bowel sounds are increased. There is tenderness in the right lower quadrant, epigastric area and left lower quadrant.  Hyperactive bowel sounds. Pain primarily in epigastric region. RLQ and LLQ pain, right worse than left.  Musculoskeletal: He exhibits no edema.  Neurological: He is alert and oriented to person, place, and time. No cranial nerve deficit.  Skin: Skin is warm and dry. No rash noted.  Psychiatric: He has a normal mood and affect. His behavior is normal.  Nursing note and vitals reviewed.         Assessment & Plan:   1. Gastroesophageal reflux disease, esophagitis presence not specified - reports doing well with a yellow pill prior (suspect ppi protonix) and failing purple pill (suspect nexium)  2. Constipation, unspecified constipation type - failed miralax but sounds like pt has been using about 3 doses daily for the past several days with minimal improvement. There is a small language barrier so difficult to know exactly but I think that pt never did 14 dose/24hrs cleanout and that he has not passed the oral contrast that he drank sev d aago.   If sxs cont, RTC for repeat xray to reassess constipation.  Could try enema.  Meds ordered this encounter  Medications  . Lactulose 20 GM/30ML SOLN    Sig: Take 30 mLs (20 g total) by mouth 2 (two) times daily.    Dispense:  500 mL    Refill:  1  . pantoprazole (PROTONIX) 40 MG tablet    Sig: Take 1 tablet (40 mg total) by mouth daily. 30 minutes before breakfast    Dispense:  90 tablet    Refill:  3    I personally performed the services described in this documentation, which was scribed in my presence. The recorded information has been reviewed and considered, and addended by me as needed.  Delman Cheadle, MD MPH

## 2015-10-11 NOTE — Patient Instructions (Addendum)
IF you received an x-ray today, you will receive an invoice from [facility]. Please contact [facility] at [phone] with questions or concerns regarding your invoice.   IF you received labwork today, you will receive an invoice from [employer]. Please contact [facility] at [phone] with questions or concerns regarding your invoice.   Our billing staff will not be able to assist you with questions regarding bills from these companies.  You will be contacted with the lab results as soon as they are available. The fastest way to get your results is to activate your My Chart account. Instructions are located on the last page of this paperwork. If you have not heard from [place] regarding the results in 2 weeks, please contact this office.     Estreimiento - Adultos (Constipation, Adult) Estreimiento significa que una persona tiene menos de tres evacuaciones en una semana, dificultad para defecar, o que las heces son secas, duras, o ms grandes que lo normal. A medida que envejecemos el estreimiento es ms comn. Una dieta baja en fibra, no tomar suficientes lquidos y el uso de ciertos medicamentos pueden Agricultural engineer.  CAUSAS   Ciertos medicamentos, como los antidepresivos, analgsicos, suplementos de hierro, anticidos y diurticos.  Algunas enfermedades, como la diabetes, el sndrome del colon irritable, enfermedad de la tiroides, o depresin.  No beber suficiente agua.  No consumir suficientes alimentos ricos en fibra.  Situaciones de estrs o viajes.  Falta de actividad fsica o de ejercicio.  Ignorar la necesidad sbita de Landscape architect.  Uso en exceso de laxantes. SIGNOS Y SNTOMAS   Defecar menos de tres veces por semana.  Dificultad para defecar.  Tener las heces secas y duras, o ms grandes que las normales.  Sensacin de estar lleno o hinchado.  Dolor en la parte baja del abdomen.  No sentir alivio  despus de defecar. DIAGNSTICO  El mdico le har una historia clnica y un examen fsico. Pueden hacerle exmenes adicionales para el estreimiento grave. Estos estudios pueden ser:  Un radiografa con enema de bario para examinar el recto, el colon y, en algunos casos, el intestino delgado.  Una sigmoidoscopia para examinar el colon inferior.  Una colonoscopia para examinar todo el colon. TRATAMIENTO  El tratamiento depender de la gravedad del estreimiento y de la causa. Algunos tratamientos nutricionales son beber ms lquidos y comer ms alimentos ricos en fibra. El cambio en el estilo de vida incluye hacer ejercicios de [place] regular. Si estas recomendaciones para Animator dieta y en el estilo de vida no ayudan, el mdico le puede indicar el uso de laxantes de venta libre para ayudarlo a Landscape architect. Los medicamentos recetados se pueden prescribir si los medicamentos de venta libre no lo [place].  INSTRUCCIONES PARA EL CUIDADO EN EL HOGAR   Consuma alimentos con alto contenido de [place], como frutas, vegetales, cereales integrales y porotos.  Limite los alimentos procesados ricos en grasas y azcar, como las papas fritas, hamburguesas, galletas, dulces y refrescos.  Puede agregar un suplemento de fibra a su dieta si no obtiene lo suficiente de los alimentos.  Beba suficiente lquido para Consulting civil engineer orina clara o de color amarillo plido.  Haga ejercicio regularmente o segn las indicaciones del mdico.  Vaya al bao cuando sienta la necesidad de ir. No se aguante las ganas.  Tome solo medicamentos de venta libre o recetados, segn las indicaciones del mdico. No tome otros medicamentos para el estreimiento sin consultarlo antes con  su mdico. SOLICITE ATENCIN [place] DE INMEDIATO SI:   Observa sangre brillante en las heces.  El estreimiento dura ms de 4 das o [place].  Siente dolor abdominal o rectal.  Las heces son delgadas como un lpiz.  Pierde peso de  [place] inexplicable. ASEGRESE DE QUE:   Comprende estas instrucciones.  Controlar su afeccin.  Recibir ayuda de inmediato si no mejora o si empeora.   Esta informacin no tiene Marine scientist el consejo del mdico. Asegrese de hacerle al mdico cualquier pregunta que tenga.   Document Released: 07/23/2007 Document Revised: 07/24/2014 Elsevier Interactive Patient Education [employer].

## 2015-10-12 NOTE — Telephone Encounter (Signed)
Please review labs. He was notified about his CT already by Aaron Edelman.

## 2015-10-12 NOTE — Telephone Encounter (Signed)
See chart. i saw pt is office yesterday 3/27. This phone call was from 3/25. . . . Marland Kitchen

## 2016-01-25 ENCOUNTER — Ambulatory Visit (INDEPENDENT_AMBULATORY_CARE_PROVIDER_SITE_OTHER): Payer: Self-pay | Admitting: Family Medicine

## 2016-01-25 VITALS — BP 110/72 | HR 74 | Temp 98.5°F | Resp 16 | Ht 73.0 in | Wt 190.0 lb

## 2016-01-25 DIAGNOSIS — M542 Cervicalgia: Secondary | ICD-10-CM

## 2016-01-25 DIAGNOSIS — S39012A Strain of muscle, fascia and tendon of lower back, initial encounter: Secondary | ICD-10-CM

## 2016-01-25 DIAGNOSIS — G8929 Other chronic pain: Secondary | ICD-10-CM

## 2016-01-25 MED ORDER — CYCLOBENZAPRINE HCL 5 MG PO TABS: ORAL_TABLET | ORAL | Status: DC

## 2016-01-25 MED ORDER — MELOXICAM 7.5 MG PO TABS: 7.5000 mg | ORAL_TABLET | Freq: Every day | ORAL | Status: DC

## 2016-01-25 NOTE — Patient Instructions (Addendum)
IF you received an x-ray today, you will receive an invoice from [facility]. Please contact [facility] at [phone] with questions or concerns regarding your invoice.   IF you received labwork today, you will receive an invoice from [employer]. Please contact [facility] at [phone] with questions or concerns regarding your invoice.   Our billing staff will not be able to assist you with questions regarding bills from these companies.  You will be contacted with the lab results as soon as they are available. The fastest way to get your results is to activate your My Chart account. Instructions are located on the last page of this paperwork. If you have not heard from [place] regarding the results in 2 weeks, please contact this office.    Your back pain appears to be due to a lumbar/low back strain. This should improve with time and other treatment as we discussed. Try the meloxicam 1 per day, Flexeril up to every 8 hours if needed, but initially start this at bedtime as it does make you sleepy. Heat or ice and gentle range of motion as tolerated. Tylenol over-the-counter can be combined with these medications if you would like. If not improving into next week, or worsening sooner, return for recheck.  Neck pain appears to be due to some chronic neck pain, so continue range of motion and if not improving with the treatment for your low back, let me know so  I can refer you to an orthopedist for other treatment.  Low Back Strain With Rehab A strain is an injury in which a tendon or muscle is torn. The muscles and tendons of the lower back are vulnerable to strains. However, these muscles and tendons are very strong and require a great force to be injured. Strains are classified into three categories. Grade 1 strains cause pain, but the tendon is not lengthened. Grade 2 strains include a lengthened ligament, due to the ligament being stretched or  partially ruptured. With grade 2 strains there is still function, although the function may be decreased. Grade 3 strains involve a complete tear of the tendon or muscle, and function is usually impaired. SYMPTOMS   Pain in the lower back.  Pain that affects one side more than the other.  Pain that gets worse with movement and may be felt in the hip, buttocks, or back of the thigh.  Muscle spasms of the muscles in the back.  Swelling along the muscles of the back.  Loss of strength of the back muscles.  Crackling sound (crepitation) when the muscles are touched. CAUSES  Lower back strains occur when a force is placed on the muscles or tendons that is greater than they can handle. Common causes of injury include:  Prolonged overuse of the muscle-tendon units in the lower back, usually from incorrect posture.  A single violent injury or force applied to the back. RISK INCREASES WITH:  Sports that involve twisting forces on the spine or a lot of bending at the waist (football, rugby, weightlifting, bowling, golf, tennis, speed skating, racquetball, swimming, running, gymnastics, diving).  Poor strength and flexibility.  Failure to warm up properly before activity.  Family history of lower back pain or disk disorders.  Previous back injury or surgery (especially fusion).  Poor posture with lifting, especially heavy objects.  Prolonged sitting, especially with poor posture. PREVENTION   Learn and use proper posture when sitting or lifting (maintain proper posture when sitting, lift using the  knees and legs, not at the waist).  Warm up and stretch properly before activity.  Allow for adequate recovery between workouts.  Maintain physical fitness:  Strength, flexibility, and endurance.  Cardiovascular fitness. PROGNOSIS  If treated properly, lower back strains usually heal within 6 weeks. RELATED COMPLICATIONS   Recurring symptoms, resulting in a chronic  problem.  Chronic inflammation, scarring, and partial muscle-tendon tear.  Delayed healing or resolution of symptoms.  Prolonged disability. TREATMENT  Treatment first involves the use of ice and medicine, to reduce pain and inflammation. The use of strengthening and stretching exercises may help reduce pain with activity. These exercises may be performed at home or with a therapist. Severe injuries may require referral to a therapist for further evaluation and treatment, such as ultrasound. Your caregiver may advise that you wear a back brace or corset, to help reduce pain and discomfort. Often, prolonged bed rest results in greater harm then benefit. Corticosteroid injections may be recommended. However, these should be reserved for the most serious cases. It is important to avoid using your back when lifting objects. At night, sleep on your back on a firm mattress with a pillow placed under your knees. If non-surgical treatment is unsuccessful, surgery may be needed.  MEDICATION   If pain medicine is needed, nonsteroidal anti-inflammatory medicines (aspirin and ibuprofen), or other minor pain relievers (acetaminophen), are often advised.  Do not take pain medicine for 7 days before surgery.  Prescription pain relievers may be given, if your caregiver thinks they are needed. Use only as directed and only as much as you need.  Ointments applied to the skin may be helpful.  Corticosteroid injections may be given by your caregiver. These injections should be reserved for the most serious cases, because they may only be given a certain number of times. HEAT AND COLD  Cold treatment (icing) should be applied for 10 to 15 minutes every 2 to 3 hours for inflammation and pain, and immediately after activity that aggravates your symptoms. Use ice packs or an ice massage.  Heat treatment may be used before performing stretching and strengthening activities prescribed by your caregiver, physical  therapist, or athletic trainer. Use a heat pack or a warm water soak. SEEK MEDICAL CARE IF:   Symptoms get worse or do not improve in 2 to 4 weeks, despite treatment.  You develop numbness, weakness, or loss of bowel or bladder function.  New, unexplained symptoms develop. (Drugs used in treatment may produce side effects.) EXERCISES  RANGE OF MOTION (ROM) AND STRETCHING EXERCISES - Low Back Strain Most people with lower back pain will find that their symptoms get worse with excessive bending forward (flexion) or arching at the lower back (extension). The exercises which will help resolve your symptoms will focus on the opposite motion.  Your physician, physical therapist or athletic trainer will help you determine which exercises will be most helpful to resolve your lower back pain. Do not complete any exercises without first consulting with your caregiver. Discontinue any exercises which make your symptoms worse until you speak to your caregiver.  If you have pain, numbness or tingling which travels down into your buttocks, leg or foot, the goal of the therapy is for these symptoms to move closer to your back and eventually resolve. Sometimes, these leg symptoms will get better, but your lower back pain may worsen. This is typically an indication of progress in your rehabilitation. Be very alert to any changes in your symptoms and the activities in  which you participated in the 24 hours prior to the change. Sharing this information with your caregiver will allow him/her to most efficiently treat your condition.  These exercises may help you when beginning to rehabilitate your injury. Your symptoms may resolve with or without further involvement from your physician, physical therapist or athletic trainer. While completing these exercises, remember:  Restoring tissue flexibility helps normal motion to return to the joints. This allows healthier, less painful movement and activity.  An effective  stretch should be held for at least 30 seconds.  A stretch should never be painful. You should only feel a gentle lengthening or release in the stretched tissue. FLEXION RANGE OF MOTION AND STRETCHING EXERCISES: STRETCH - Flexion, Single Knee to Chest   Lie on a firm bed or floor with both legs extended in front of you.  Keeping one leg in contact with the floor, bring your opposite knee to your chest. Hold your leg in place by either grabbing behind your thigh or at your knee.  Pull until you feel a gentle stretch in your lower back. Hold __________ seconds.  Slowly release your grasp and repeat the exercise with the opposite side. Repeat __________ times. Complete this exercise __________ times per day.  STRETCH - Flexion, Double Knee to Chest   Lie on a firm bed or floor with both legs extended in front of you.  Keeping one leg in contact with the floor, bring your opposite knee to your chest.  Tense your stomach muscles to support your back and then lift your other knee to your chest. Hold your legs in place by either grabbing behind your thighs or at your knees.  Pull both knees toward your chest until you feel a gentle stretch in your lower back. Hold __________ seconds.  Tense your stomach muscles and slowly return one leg at a time to the floor. Repeat __________ times. Complete this exercise __________ times per day.  STRETCH - Low Trunk Rotation  Lie on a firm bed or floor. Keeping your legs in front of you, bend your knees so they are both pointed toward the ceiling and your feet are flat on the floor.  Extend your arms out to the side. This will stabilize your upper body by keeping your shoulders in contact with the floor.  Gently and slowly drop both knees together to one side until you feel a gentle stretch in your lower back. Hold for __________ seconds.  Tense your stomach muscles to support your lower back as you bring your knees back to the starting position.  Repeat the exercise to the other side. Repeat __________ times. Complete this exercise __________ times per day  EXTENSION RANGE OF MOTION AND FLEXIBILITY EXERCISES: STRETCH - Extension, Prone on Elbows   Lie on your stomach on the floor, a bed will be too soft. Place your palms about shoulder width apart and at the height of your head.  Place your elbows under your shoulders. If this is too painful, stack pillows under your chest.  Allow your body to relax so that your hips drop lower and make contact more completely with the floor.  Hold this position for __________ seconds.  Slowly return to lying flat on the floor. Repeat __________ times. Complete this exercise __________ times per day.  RANGE OF MOTION - Extension, Prone Press Ups  Lie on your stomach on the floor, a bed will be too soft. Place your palms about shoulder width apart and at the height of  your head.  Keeping your back as relaxed as possible, slowly straighten your elbows while keeping your hips on the floor. You may adjust the placement of your hands to maximize your comfort. As you gain motion, your hands will come more underneath your shoulders.  Hold this position __________ seconds.  Slowly return to lying flat on the floor. Repeat __________ times. Complete this exercise __________ times per day.  RANGE OF MOTION- Quadruped, Neutral Spine   Assume a hands and knees position on a firm surface. Keep your hands under your shoulders and your knees under your hips. You may place padding under your knees for comfort.  Drop your head and point your tail bone toward the ground below you. This will round out your lower back like an angry cat. Hold this position for __________ seconds.  Slowly lift your head and release your tail bone so that your back sags into a large arch, like an old horse.  Hold this position for __________ seconds.  Repeat this until you feel limber in your lower back.  Now, find your "sweet  spot." This will be the most comfortable position somewhere between the two previous positions. This is your neutral spine. Once you have found this position, tense your stomach muscles to support your lower back.  Hold this position for __________ seconds. Repeat __________ times. Complete this exercise __________ times per day.  STRENGTHENING EXERCISES - Low Back Strain These exercises may help you when beginning to rehabilitate your injury. These exercises should be done near your "sweet spot." This is the neutral, low-back arch, somewhere between fully rounded and fully arched, that is your least painful position. When performed in this safe range of motion, these exercises can be used for people who have either a flexion or extension based injury. These exercises may resolve your symptoms with or without further involvement from your physician, physical therapist or athletic trainer. While completing these exercises, remember:   Muscles can gain both the endurance and the strength needed for everyday activities through controlled exercises.  Complete these exercises as instructed by your physician, physical therapist or athletic trainer. Increase the resistance and repetitions only as guided.  You may experience muscle soreness or fatigue, but the pain or discomfort you are trying to eliminate should never worsen during these exercises. If this pain does worsen, stop and make certain you are following the directions exactly. If the pain is still present after adjustments, discontinue the exercise until you can discuss the trouble with your caregiver. STRENGTHENING - Deep Abdominals, Pelvic Tilt  Lie on a firm bed or floor. Keeping your legs in front of you, bend your knees so they are both pointed toward the ceiling and your feet are flat on the floor.  Tense your lower abdominal muscles to press your lower back into the floor. This motion will rotate your pelvis so that your tail bone is  scooping upwards rather than pointing at your feet or into the floor.  With a gentle tension and even breathing, hold this position for __________ seconds. Repeat __________ times. Complete this exercise __________ times per day.  STRENGTHENING - Abdominals, Crunches   Lie on a firm bed or floor. Keeping your legs in front of you, bend your knees so they are both pointed toward the ceiling and your feet are flat on the floor. Cross your arms over your chest.  Slightly tip your chin down without bending your neck.  Tense your abdominals and slowly lift your trunk  high enough to just clear your shoulder blades. Lifting higher can put excessive stress on the lower back and does not further strengthen your abdominal muscles.  Control your return to the starting position. Repeat __________ times. Complete this exercise __________ times per day.  STRENGTHENING - Quadruped, Opposite UE/LE Lift   Assume a hands and knees position on a firm surface. Keep your hands under your shoulders and your knees under your hips. You may place padding under your knees for comfort.  Find your neutral spine and gently tense your abdominal muscles so that you can maintain this position. Your shoulders and hips should form a rectangle that is parallel with the floor and is not twisted.  Keeping your trunk steady, lift your right hand no higher than your shoulder and then your left leg no higher than your hip. Make sure you are not holding your breath. Hold this position __________ seconds.  Continuing to keep your abdominal muscles tense and your back steady, slowly return to your starting position. Repeat with the opposite arm and leg. Repeat __________ times. Complete this exercise __________ times per day.  STRENGTHENING - Lower Abdominals, Double Knee Lift  Lie on a firm bed or floor. Keeping your legs in front of you, bend your knees so they are both pointed toward the ceiling and your feet are flat on the  floor.  Tense your abdominal muscles to brace your lower back and slowly lift both of your knees until they come over your hips. Be certain not to hold your breath.  Hold __________ seconds. Using your abdominal muscles, return to the starting position in a slow and controlled manner. Repeat __________ times. Complete this exercise __________ times per day.  POSTURE AND BODY MECHANICS CONSIDERATIONS - Low Back Strain Keeping correct posture when sitting, standing or completing your activities will reduce the stress put on different body tissues, allowing injured tissues a chance to heal and limiting painful experiences. The following are general guidelines for improved posture. Your physician or physical therapist will provide you with any instructions specific to your needs. While reading these guidelines, remember:  The exercises prescribed by your provider will help you have the flexibility and strength to maintain correct postures.  The correct posture provides the best environment for your joints to work. All of your joints have less wear and tear when properly supported by a spine with good posture. This means you will experience a healthier, less painful body.  Correct posture must be practiced with all of your activities, especially prolonged sitting and standing. Correct posture is as important when doing repetitive low-stress activities (typing) as it is when doing a single heavy-load activity (lifting). RESTING POSITIONS Consider which positions are most painful for you when choosing a resting position. If you have pain with flexion-based activities (sitting, bending, stooping, squatting), choose a position that allows you to rest in a less flexed posture. You would want to avoid curling into a fetal position on your side. If your pain worsens with extension-based activities (prolonged standing, working overhead), avoid resting in an extended position such as sleeping on your stomach. Most  people will find more comfort when they rest with their spine in a more neutral position, neither too rounded nor too arched. Lying on a non-sagging bed on your side with a pillow between your knees, or on your back with a pillow under your knees will often provide some relief. Keep in mind, being in any one position for a prolonged period of  time, no matter how correct your posture, can still lead to stiffness. PROPER SITTING POSTURE In order to minimize stress and discomfort on your spine, you must sit with correct posture. Sitting with good posture should be effortless for a healthy body. Returning to good posture is a gradual process. Many people can work toward this most comfortably by using various supports until they have the flexibility and strength to maintain this posture on their own. When sitting with proper posture, your ears will fall over your shoulders and your shoulders will fall over your hips. You should use the back of the chair to support your upper back. Your lower back will be in a neutral position, just slightly arched. You may place a small pillow or folded towel at the base of your lower back for support.  When working at a desk, create an environment that supports good, upright posture. Without extra support, muscles tire, which leads to excessive strain on joints and other tissues. Keep these recommendations in mind: CHAIR:  A chair should be able to slide under your desk when your back makes contact with the back of the chair. This allows you to work closely.  The chair's height should allow your eyes to be level with the upper part of your monitor and your hands to be slightly lower than your elbows. BODY POSITION  Your feet should make contact with the floor. If this is not possible, use a foot rest.  Keep your ears over your shoulders. This will reduce stress on your neck and lower back. INCORRECT SITTING POSTURES  If you are feeling tired and unable to assume a  healthy sitting posture, do not slouch or slump. This puts excessive strain on your back tissues, causing more damage and pain. Healthier options include:  Using more support, like a lumbar pillow.  Switching tasks to something that requires you to be upright or walking.  Talking a brief walk.  Lying down to rest in a neutral-spine position. PROLONGED STANDING WHILE SLIGHTLY LEANING FORWARD  When completing a task that requires you to lean forward while standing in one place for a long time, place either foot up on a stationary 2-4 inch high object to help maintain the best posture. When both feet are on the ground, the lower back tends to lose its slight inward curve. If this curve flattens (or becomes too large), then the back and your other joints will experience too much stress, tire more quickly, and can cause pain. CORRECT STANDING POSTURES Proper standing posture should be assumed with all daily activities, even if they only take a few moments, like when brushing your teeth. As in sitting, your ears should fall over your shoulders and your shoulders should fall over your hips. You should keep a slight tension in your abdominal muscles to brace your spine. Your tailbone should point down to the ground, not behind your body, resulting in an over-extended swayback posture.  INCORRECT STANDING POSTURES  Common incorrect standing postures include a forward head, locked knees and/or an excessive swayback. WALKING Walk with an upright posture. Your ears, shoulders and hips should all line-up. PROLONGED ACTIVITY IN A FLEXED POSITION When completing a task that requires you to bend forward at your waist or lean over a low surface, try to find a way to stabilize 3 out of 4 of your limbs. You can place a hand or elbow on your thigh or rest a knee on the surface you are reaching across. This will provide  you more stability so that your muscles do not fatigue as quickly. By keeping your knees relaxed, or  slightly bent, you will also reduce stress across your lower back. CORRECT LIFTING TECHNIQUES DO :   Assume a wide stance. This will provide you more stability and the opportunity to get as close as possible to the object which you are lifting.  Tense your abdominals to brace your spine. Bend at the knees and hips. Keeping your back locked in a neutral-spine position, lift using your leg muscles. Lift with your legs, keeping your back straight.  Test the weight of unknown objects before attempting to lift them.  Try to keep your elbows locked down at your sides in order get the best strength from your shoulders when carrying an object.  Always ask for help when lifting heavy or awkward objects. INCORRECT LIFTING TECHNIQUES DO NOT:   Lock your knees when lifting, even if it is a small object.  Bend and twist. Pivot at your feet or move your feet when needing to change directions.  Assume that you can safely pick up even a paper clip without proper posture.   This information is not intended to replace advice given to you by your health care provider. Make sure you discuss any questions you have with your health care provider.   Document Released: 07/03/2005 Document Revised: 07/24/2014 Document Reviewed: 10/15/2008 [employer] Interactive Patient Education [employer].

## 2016-01-25 NOTE — Progress Notes (Addendum)
Subjective:  By signing my name below, I, Moises Blood, attest that this documentation has been prepared under the direction and in the presence of Merri Ray, MD. Electronically Signed: Moises Blood, [place]. 01/25/2016 , 4:42 PM .  Patient was seen in Room 8 .   Patient ID: Jerry Steele, male    DOB: 06/25/1963, 50 y.o.   MRN: [id number] Chief Complaint  Patient presents with  . Back Pain    BACK AND NECK   . Headache   HPI Jerry Steele is a 50 y.o. male Here for neck and back pain as well as headache.   Patient states he felt back pain when he was bending down to grab his dog's food bowl. He now notices pain when standing up after sitting and also with walking. He denies taking any medication for this issue. He denies lower extremity weakness.   Patient also reports having ongoing neck pain for many years. He's had an injection done in the past by a prior provider, unable to recall name; last seen about 2 years ago. He was recommended to have surgery done but he declined it because he feared surgery. He had taken ibuprofen in the past but none taken recently. He denies taking any medication for this neck pain. He denies urinary symptoms, urinary or bowel symptoms or numbness with wiping.   There are no active problems to display for this patient.  History reviewed. No pertinent past medical history. History reviewed. No pertinent past surgical history. No Known Allergies Prior to Admission medications   Medication Sig Start Date End Date Taking? Authorizing Provider  Lactulose 20 GM/30ML SOLN Take 30 mLs (20 g total) by mouth 2 (two) times daily. Patient not taking: Reported on 01/25/2016 10/11/15   Shawnee Knapp, MD   Social History   Social History  . Marital Status: Single    Spouse Name: N/A  . Number of Children: N/A  . Years of Education: N/A   Occupational History  . Not on file.   Social History Main Topics  . Smoking status: Current  Every Day Smoker  . Smokeless tobacco: Not on file  . Alcohol Use: No  . Drug Use: No  . Sexual Activity: Not on file   Other Topics Concern  . Not on file   Social History Narrative   Review of Systems  Constitutional: Negative for fever, chills and fatigue.  Respiratory: Negative for cough, shortness of breath and wheezing.   Gastrointestinal: Negative for nausea, vomiting and diarrhea.  Musculoskeletal: Positive for back pain, neck pain and neck stiffness.  Neurological: Positive for headaches. Negative for weakness.       Objective:   Physical Exam  Constitutional: He is oriented to person, place, and time. He appears well-developed and well-nourished. No distress.  HENT:  Head: Normocephalic and atraumatic.  Eyes: EOM are normal. Pupils are equal, round, and reactive to light.  Neck: Neck supple.  Cardiovascular: Normal rate.   Pulmonary/Chest: Effort normal. No respiratory distress.  Musculoskeletal: Normal range of motion.  Neck: equal rom, slight decreased extension Back: slight paraspinal spasms, no midline bony tenderness, L-spine flexion 80 degrees, seated straight leg raise, pain in back only, no radiation on both left or right  Neurological: He is alert and oriented to person, place, and time. He displays no Babinski's sign on the right side. He displays no Babinski's sign on the left side.  Reflex Scores:      Brachioradialis reflexes are 2+ on the right  side and 2+ on the left side.      Patellar reflexes are 2+ on the right side and 2+ on the left side.      Achilles reflexes are 2+ on the right side and 2+ on the left side. decreased bicep and tricep DTR's but equal bilaterally  Skin: Skin is warm and dry.  Psychiatric: He has a normal mood and affect. His behavior is normal.  Nursing note and vitals reviewed.   Filed Vitals:   01/25/16 1545  BP: 110/72  Pulse: 74  Temp: 98.5 F (36.9 C)  TempSrc: Oral  Resp: 16  Height: 6\' 1"  (1.854 m)  Weight:  190 lb (86.183 kg)  SpO2: 98%      Assessment & Plan:   Jerry Steele is a 50 y.o. male Low back strain, initial encounter - Plan: meloxicam (MOBIC) 7.5 MG tablet, cyclobenzaprine (FLEXERIL) 5 MG tablet  -suspected lumbar strain. Reassuring exam, deferred xray at present.   -mobic 7.5mg  QD prn, flexeril 5mg  tid prn. Side effects discussed.   -ROM, HEP by handout and RTC precautions discussed  Chronic neck pain  - mobic as above may help. ROM and if not improving, consider ortho eval if persistent.   Meds ordered this encounter  Medications  . meloxicam (MOBIC) 7.5 MG tablet    Sig: Take 1 tablet (7.5 mg total) by mouth daily.    Dispense:  30 tablet    Refill:  0  . cyclobenzaprine (FLEXERIL) 5 MG tablet    Sig: 1 pill by mouth up to every 8 hours as needed. Start with one pill by mouth each bedtime as needed due to sedation    Dispense:  15 tablet    Refill:  0   Patient Instructions       IF you received an x-ray today, you will receive an invoice from [facility]. Please contact [facility] at [phone] with questions or concerns regarding your invoice.   IF you received labwork today, you will receive an invoice from [employer]. Please contact [facility] at [phone] with questions or concerns regarding your invoice.   Our billing staff will not be able to assist you with questions regarding bills from these companies.  You will be contacted with the lab results as soon as they are available. The fastest way to get your results is to activate your My Chart account. Instructions are located on the last page of this paperwork. If you have not heard from [place] regarding the results in 2 weeks, please contact this office.    Your back pain appears to be due to a lumbar/low back strain. This should improve with time and other treatment as we discussed. Try the meloxicam 1 per day, Flexeril up to every 8 hours if needed,  but initially start this at bedtime as it does make you sleepy. Heat or ice and gentle range of motion as tolerated. Tylenol over-the-counter can be combined with these medications if you would like. If not improving into next week, or worsening sooner, return for recheck.  Neck pain appears to be due to some chronic neck pain, so continue range of motion and if not improving with the treatment for your low back, let me know so  I can refer you to an orthopedist for other treatment.  Low Back Strain With Rehab A strain is an injury in which a tendon or muscle is torn. The muscles and tendons of the lower back are vulnerable to strains.  However, these muscles and tendons are very strong and require a great force to be injured. Strains are classified into three categories. Grade 1 strains cause pain, but the tendon is not lengthened. Grade 2 strains include a lengthened ligament, due to the ligament being stretched or partially ruptured. With grade 2 strains there is still function, although the function may be decreased. Grade 3 strains involve a complete tear of the tendon or muscle, and function is usually impaired. SYMPTOMS   Pain in the lower back.  Pain that affects one side more than the other.  Pain that gets worse with movement and may be felt in the hip, buttocks, or back of the thigh.  Muscle spasms of the muscles in the back.  Swelling along the muscles of the back.  Loss of strength of the back muscles.  Crackling sound (crepitation) when the muscles are touched. CAUSES  Lower back strains occur when a force is placed on the muscles or tendons that is greater than they can handle. Common causes of injury include:  Prolonged overuse of the muscle-tendon units in the lower back, usually from incorrect posture.  A single violent injury or force applied to the back. RISK INCREASES WITH:  Sports that involve twisting forces on the spine or a lot of bending at the waist (football,  rugby, weightlifting, bowling, golf, tennis, speed skating, racquetball, swimming, running, gymnastics, diving).  Poor strength and flexibility.  Failure to warm up properly before activity.  Family history of lower back pain or disk disorders.  Previous back injury or surgery (especially fusion).  Poor posture with lifting, especially heavy objects.  Prolonged sitting, especially with poor posture. PREVENTION   Learn and use proper posture when sitting or lifting (maintain proper posture when sitting, lift using the knees and legs, not at the waist).  Warm up and stretch properly before activity.  Allow for adequate recovery between workouts.  Maintain physical fitness:  Strength, flexibility, and endurance.  Cardiovascular fitness. PROGNOSIS  If treated properly, lower back strains usually heal within 6 weeks. RELATED COMPLICATIONS   Recurring symptoms, resulting in a chronic problem.  Chronic inflammation, scarring, and partial muscle-tendon tear.  Delayed healing or resolution of symptoms.  Prolonged disability. TREATMENT  Treatment first involves the use of ice and medicine, to reduce pain and inflammation. The use of strengthening and stretching exercises may help reduce pain with activity. These exercises may be performed at home or with a therapist. Severe injuries may require referral to a therapist for further evaluation and treatment, such as ultrasound. Your caregiver may advise that you wear a back brace or corset, to help reduce pain and discomfort. Often, prolonged bed rest results in greater harm then benefit. Corticosteroid injections may be recommended. However, these should be reserved for the most serious cases. It is important to avoid using your back when lifting objects. At night, sleep on your back on a firm mattress with a pillow placed under your knees. If non-surgical treatment is unsuccessful, surgery may be needed.  MEDICATION   If pain medicine  is needed, nonsteroidal anti-inflammatory medicines (aspirin and ibuprofen), or other minor pain relievers (acetaminophen), are often advised.  Do not take pain medicine for 7 days before surgery.  Prescription pain relievers may be given, if your caregiver thinks they are needed. Use only as directed and only as much as you need.  Ointments applied to the skin may be helpful.  Corticosteroid injections may be given by your caregiver. These injections should  be reserved for the most serious cases, because they may only be given a certain number of times. HEAT AND COLD  Cold treatment (icing) should be applied for 10 to 15 minutes every 2 to 3 hours for inflammation and pain, and immediately after activity that aggravates your symptoms. Use ice packs or an ice massage.  Heat treatment may be used before performing stretching and strengthening activities prescribed by your caregiver, physical therapist, or athletic trainer. Use a heat pack or a warm water soak. SEEK MEDICAL CARE IF:   Symptoms get worse or do not improve in 2 to 4 weeks, despite treatment.  You develop numbness, weakness, or loss of bowel or bladder function.  New, unexplained symptoms develop. (Drugs used in treatment may produce side effects.) EXERCISES  RANGE OF MOTION (ROM) AND STRETCHING EXERCISES - Low Back Strain Most people with lower back pain will find that their symptoms get worse with excessive bending forward (flexion) or arching at the lower back (extension). The exercises which will help resolve your symptoms will focus on the opposite motion.  Your physician, physical therapist or athletic trainer will help you determine which exercises will be most helpful to resolve your lower back pain. Do not complete any exercises without first consulting with your caregiver. Discontinue any exercises which make your symptoms worse until you speak to your caregiver.  If you have pain, numbness or tingling which travels  down into your buttocks, leg or foot, the goal of the therapy is for these symptoms to move closer to your back and eventually resolve. Sometimes, these leg symptoms will get better, but your lower back pain may worsen. This is typically an indication of progress in your rehabilitation. Be very alert to any changes in your symptoms and the activities in which you participated in the 24 hours prior to the change. Sharing this information with your caregiver will allow him/her to most efficiently treat your condition.  These exercises may help you when beginning to rehabilitate your injury. Your symptoms may resolve with or without further involvement from your physician, physical therapist or athletic trainer. While completing these exercises, remember:  Restoring tissue flexibility helps normal motion to return to the joints. This allows healthier, less painful movement and activity.  An effective stretch should be held for at least 30 seconds.  A stretch should never be painful. You should only feel a gentle lengthening or release in the stretched tissue. FLEXION RANGE OF MOTION AND STRETCHING EXERCISES: STRETCH - Flexion, Single Knee to Chest   Lie on a firm bed or floor with both legs extended in front of you.  Keeping one leg in contact with the floor, bring your opposite knee to your chest. Hold your leg in place by either grabbing behind your thigh or at your knee.  Pull until you feel a gentle stretch in your lower back. Hold __________ seconds.  Slowly release your grasp and repeat the exercise with the opposite side. Repeat __________ times. Complete this exercise __________ times per day.  STRETCH - Flexion, Double Knee to Chest   Lie on a firm bed or floor with both legs extended in front of you.  Keeping one leg in contact with the floor, bring your opposite knee to your chest.  Tense your stomach muscles to support your back and then lift your other knee to your chest. Hold  your legs in place by either grabbing behind your thighs or at your knees.  Pull both knees toward your chest  until you feel a gentle stretch in your lower back. Hold __________ seconds.  Tense your stomach muscles and slowly return one leg at a time to the floor. Repeat __________ times. Complete this exercise __________ times per day.  STRETCH - Low Trunk Rotation  Lie on a firm bed or floor. Keeping your legs in front of you, bend your knees so they are both pointed toward the ceiling and your feet are flat on the floor.  Extend your arms out to the side. This will stabilize your upper body by keeping your shoulders in contact with the floor.  Gently and slowly drop both knees together to one side until you feel a gentle stretch in your lower back. Hold for __________ seconds.  Tense your stomach muscles to support your lower back as you bring your knees back to the starting position. Repeat the exercise to the other side. Repeat __________ times. Complete this exercise __________ times per day  EXTENSION RANGE OF MOTION AND FLEXIBILITY EXERCISES: STRETCH - Extension, Prone on Elbows   Lie on your stomach on the floor, a bed will be too soft. Place your palms about shoulder width apart and at the height of your head.  Place your elbows under your shoulders. If this is too painful, stack pillows under your chest.  Allow your body to relax so that your hips drop lower and make contact more completely with the floor.  Hold this position for __________ seconds.  Slowly return to lying flat on the floor. Repeat __________ times. Complete this exercise __________ times per day.  RANGE OF MOTION - Extension, Prone Press Ups  Lie on your stomach on the floor, a bed will be too soft. Place your palms about shoulder width apart and at the height of your head.  Keeping your back as relaxed as possible, slowly straighten your elbows while keeping your hips on the floor. You may adjust the  placement of your hands to maximize your comfort. As you gain motion, your hands will come more underneath your shoulders.  Hold this position __________ seconds.  Slowly return to lying flat on the floor. Repeat __________ times. Complete this exercise __________ times per day.  RANGE OF MOTION- Quadruped, Neutral Spine   Assume a hands and knees position on a firm surface. Keep your hands under your shoulders and your knees under your hips. You may place padding under your knees for comfort.  Drop your head and point your tail bone toward the ground below you. This will round out your lower back like an angry cat. Hold this position for __________ seconds.  Slowly lift your head and release your tail bone so that your back sags into a large arch, like an old horse.  Hold this position for __________ seconds.  Repeat this until you feel limber in your lower back.  Now, find your "sweet spot." This will be the most comfortable position somewhere between the two previous positions. This is your neutral spine. Once you have found this position, tense your stomach muscles to support your lower back.  Hold this position for __________ seconds. Repeat __________ times. Complete this exercise __________ times per day.  STRENGTHENING EXERCISES - Low Back Strain These exercises may help you when beginning to rehabilitate your injury. These exercises should be done near your "sweet spot." This is the neutral, low-back arch, somewhere between fully rounded and fully arched, that is your least painful position. When performed in this safe range of motion, these exercises  can be used for people who have either a flexion or extension based injury. These exercises may resolve your symptoms with or without further involvement from your physician, physical therapist or athletic trainer. While completing these exercises, remember:   Muscles can gain both the endurance and the strength needed for everyday  activities through controlled exercises.  Complete these exercises as instructed by your physician, physical therapist or athletic trainer. Increase the resistance and repetitions only as guided.  You may experience muscle soreness or fatigue, but the pain or discomfort you are trying to eliminate should never worsen during these exercises. If this pain does worsen, stop and make certain you are following the directions exactly. If the pain is still present after adjustments, discontinue the exercise until you can discuss the trouble with your caregiver. STRENGTHENING - Deep Abdominals, Pelvic Tilt  Lie on a firm bed or floor. Keeping your legs in front of you, bend your knees so they are both pointed toward the ceiling and your feet are flat on the floor.  Tense your lower abdominal muscles to press your lower back into the floor. This motion will rotate your pelvis so that your tail bone is scooping upwards rather than pointing at your feet or into the floor.  With a gentle tension and even breathing, hold this position for __________ seconds. Repeat __________ times. Complete this exercise __________ times per day.  STRENGTHENING - Abdominals, Crunches   Lie on a firm bed or floor. Keeping your legs in front of you, bend your knees so they are both pointed toward the ceiling and your feet are flat on the floor. Cross your arms over your chest.  Slightly tip your chin down without bending your neck.  Tense your abdominals and slowly lift your trunk high enough to just clear your shoulder blades. Lifting higher can put excessive stress on the lower back and does not further strengthen your abdominal muscles.  Control your return to the starting position. Repeat __________ times. Complete this exercise __________ times per day.  STRENGTHENING - Quadruped, Opposite UE/LE Lift   Assume a hands and knees position on a firm surface. Keep your hands under your shoulders and your knees under your  hips. You may place padding under your knees for comfort.  Find your neutral spine and gently tense your abdominal muscles so that you can maintain this position. Your shoulders and hips should form a rectangle that is parallel with the floor and is not twisted.  Keeping your trunk steady, lift your right hand no higher than your shoulder and then your left leg no higher than your hip. Make sure you are not holding your breath. Hold this position __________ seconds.  Continuing to keep your abdominal muscles tense and your back steady, slowly return to your starting position. Repeat with the opposite arm and leg. Repeat __________ times. Complete this exercise __________ times per day.  STRENGTHENING - Lower Abdominals, Double Knee Lift  Lie on a firm bed or floor. Keeping your legs in front of you, bend your knees so they are both pointed toward the ceiling and your feet are flat on the floor.  Tense your abdominal muscles to brace your lower back and slowly lift both of your knees until they come over your hips. Be certain not to hold your breath.  Hold __________ seconds. Using your abdominal muscles, return to the starting position in a slow and controlled manner. Repeat __________ times. Complete this exercise __________ times per day.  POSTURE AND BODY MECHANICS CONSIDERATIONS - Low Back Strain Keeping correct posture when sitting, standing or completing your activities will reduce the stress put on different body tissues, allowing injured tissues a chance to heal and limiting painful experiences. The following are general guidelines for improved posture. Your physician or physical therapist will provide you with any instructions specific to your needs. While reading these guidelines, remember:  The exercises prescribed by your provider will help you have the flexibility and strength to maintain correct postures.  The correct posture provides the best environment for your joints to work.  All of your joints have less wear and tear when properly supported by a spine with good posture. This means you will experience a healthier, less painful body.  Correct posture must be practiced with all of your activities, especially prolonged sitting and standing. Correct posture is as important when doing repetitive low-stress activities (typing) as it is when doing a single heavy-load activity (lifting). RESTING POSITIONS Consider which positions are most painful for you when choosing a resting position. If you have pain with flexion-based activities (sitting, bending, stooping, squatting), choose a position that allows you to rest in a less flexed posture. You would want to avoid curling into a fetal position on your side. If your pain worsens with extension-based activities (prolonged standing, working overhead), avoid resting in an extended position such as sleeping on your stomach. Most people will find more comfort when they rest with their spine in a more neutral position, neither too rounded nor too arched. Lying on a non-sagging bed on your side with a pillow between your knees, or on your back with a pillow under your knees will often provide some relief. Keep in mind, being in any one position for a prolonged period of time, no matter how correct your posture, can still lead to stiffness. PROPER SITTING POSTURE In order to minimize stress and discomfort on your spine, you must sit with correct posture. Sitting with good posture should be effortless for a healthy body. Returning to good posture is a gradual process. Many people can work toward this most comfortably by using various supports until they have the flexibility and strength to maintain this posture on their own. When sitting with proper posture, your ears will fall over your shoulders and your shoulders will fall over your hips. You should use the back of the chair to support your upper back. Your lower back will be in a neutral  position, just slightly arched. You may place a small pillow or folded towel at the base of your lower back for support.  When working at a desk, create an environment that supports good, upright posture. Without extra support, muscles tire, which leads to excessive strain on joints and other tissues. Keep these recommendations in mind: CHAIR:  A chair should be able to slide under your desk when your back makes contact with the back of the chair. This allows you to work closely.  The chair's height should allow your eyes to be level with the upper part of your monitor and your hands to be slightly lower than your elbows. BODY POSITION  Your feet should make contact with the floor. If this is not possible, use a foot rest.  Keep your ears over your shoulders. This will reduce stress on your neck and lower back. INCORRECT SITTING POSTURES  If you are feeling tired and unable to assume a healthy sitting posture, do not slouch or slump. This puts excessive strain on  your back tissues, causing more damage and pain. Healthier options include:  Using more support, like a lumbar pillow.  Switching tasks to something that requires you to be upright or walking.  Talking a brief walk.  Lying down to rest in a neutral-spine position. PROLONGED STANDING WHILE SLIGHTLY LEANING FORWARD  When completing a task that requires you to lean forward while standing in one place for a long time, place either foot up on a stationary 2-4 inch high object to help maintain the best posture. When both feet are on the ground, the lower back tends to lose its slight inward curve. If this curve flattens (or becomes too large), then the back and your other joints will experience too much stress, tire more quickly, and can cause pain. CORRECT STANDING POSTURES Proper standing posture should be assumed with all daily activities, even if they only take a few moments, like when brushing your teeth. As in sitting, your ears  should fall over your shoulders and your shoulders should fall over your hips. You should keep a slight tension in your abdominal muscles to brace your spine. Your tailbone should point down to the ground, not behind your body, resulting in an over-extended swayback posture.  INCORRECT STANDING POSTURES  Common incorrect standing postures include a forward head, locked knees and/or an excessive swayback. WALKING Walk with an upright posture. Your ears, shoulders and hips should all line-up. PROLONGED ACTIVITY IN A FLEXED POSITION When completing a task that requires you to bend forward at your waist or lean over a low surface, try to find a way to stabilize 3 out of 4 of your limbs. You can place a hand or elbow on your thigh or rest a knee on the surface you are reaching across. This will provide you more stability so that your muscles do not fatigue as quickly. By keeping your knees relaxed, or slightly bent, you will also reduce stress across your lower back. CORRECT LIFTING TECHNIQUES DO :   Assume a wide stance. This will provide you more stability and the opportunity to get as close as possible to the object which you are lifting.  Tense your abdominals to brace your spine. Bend at the knees and hips. Keeping your back locked in a neutral-spine position, lift using your leg muscles. Lift with your legs, keeping your back straight.  Test the weight of unknown objects before attempting to lift them.  Try to keep your elbows locked down at your sides in order get the best strength from your shoulders when carrying an object.  Always ask for help when lifting heavy or awkward objects. INCORRECT LIFTING TECHNIQUES DO NOT:   Lock your knees when lifting, even if it is a small object.  Bend and twist. Pivot at your feet or move your feet when needing to change directions.  Assume that you can safely pick up even a paper clip without proper posture.   This information is not intended to  replace advice given to you by your health care provider. Make sure you discuss any questions you have with your health care provider.   Document Released: 07/03/2005 Document Revised: 07/24/2014 Document Reviewed: 10/15/2008 Elsevier Interactive Patient Education [employer].       I personally performed the services described in this documentation, which was scribed in my presence. The recorded information has been reviewed and considered, and addended by me as needed.   Signed,   Merri Ray, MD [facility].  01/27/2016 8:05 PM

## 2016-03-31 ENCOUNTER — Ambulatory Visit (INDEPENDENT_AMBULATORY_CARE_PROVIDER_SITE_OTHER): Payer: Self-pay | Admitting: Family Medicine

## 2016-03-31 VITALS — BP 110/70 | HR 58 | Temp 97.7°F | Resp 18 | Ht 73.0 in | Wt 185.4 lb

## 2016-03-31 DIAGNOSIS — N529 Male erectile dysfunction, unspecified: Secondary | ICD-10-CM

## 2016-03-31 DIAGNOSIS — F17200 Nicotine dependence, unspecified, uncomplicated: Secondary | ICD-10-CM

## 2016-03-31 DIAGNOSIS — G8929 Other chronic pain: Secondary | ICD-10-CM

## 2016-03-31 DIAGNOSIS — G44219 Episodic tension-type headache, not intractable: Secondary | ICD-10-CM

## 2016-03-31 DIAGNOSIS — R0789 Other chest pain: Secondary | ICD-10-CM

## 2016-03-31 DIAGNOSIS — K5900 Constipation, unspecified: Secondary | ICD-10-CM

## 2016-03-31 DIAGNOSIS — M542 Cervicalgia: Secondary | ICD-10-CM

## 2016-03-31 DIAGNOSIS — M549 Dorsalgia, unspecified: Secondary | ICD-10-CM

## 2016-03-31 DIAGNOSIS — K5909 Other constipation: Secondary | ICD-10-CM

## 2016-03-31 DIAGNOSIS — K219 Gastro-esophageal reflux disease without esophagitis: Secondary | ICD-10-CM

## 2016-03-31 LAB — LIPID PANEL
CHOLESTEROL: 206 mg/dL — AB (ref 125–200)
HDL: 40 mg/dL (ref >=40)
LDL Cholesterol: 124 mg/dL (ref <130)
TRIGLYCERIDES: 210 mg/dL — AB (ref <150)
Total CHOL/HDL Ratio: 5.2 Ratio — ABNORMAL HIGH (ref <=5.0)
VLDL: 42 mg/dL — AB (ref <30)

## 2016-03-31 MED ORDER — RABEPRAZOLE SODIUM 20 MG PO TBEC: 20.0000 mg | DELAYED_RELEASE_TABLET | Freq: Every day | ORAL | 3 refills | Status: DC

## 2016-03-31 MED ORDER — LACTULOSE 20 GM/30ML PO SOLN: 20.0000 g | Freq: Two times a day (BID) | ORAL | 3 refills | Status: DC | PRN

## 2016-03-31 MED ORDER — TRAMADOL HCL 50 MG PO TABS: 50.0000 mg | ORAL_TABLET | Freq: Four times a day (QID) | ORAL | 1 refills | Status: DC | PRN

## 2016-03-31 MED ORDER — SILDENAFIL CITRATE 20 MG PO TABS: ORAL_TABLET | ORAL | 5 refills | Status: DC

## 2016-03-31 MED ORDER — BUTALBITAL-APAP-CAFFEINE 50-325-40 MG PO TABS: 1.0000 | ORAL_TABLET | Freq: Four times a day (QID) | ORAL | 0 refills | Status: DC | PRN
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
## 2016-03-31 NOTE — Patient Instructions (Addendum)
[facility] Policy for Prescribing Controlled Substances (Revised 05/2012) 1. Prescriptions for controlled substances will be filled by ONE provider at [facility] with whom you have established and developed a plan for your care, including follow-up. 2. You are encouraged to schedule an appointment with your prescriber at our appointment center for follow-up visits whenever possible. 3. If you request a prescription for the controlled substance while at [facility] for an acute problem (with someone other than your regular prescriber), you MAY be given a ONE-TIME prescription for a 30-day supply of the controlled substance, to allow time for you to return to see your regular prescriber for additional prescriptions.    IF you received an x-ray today, you will receive an invoice from [facility]. Please contact [facility] at [phone] with questions or concerns regarding your invoice.   IF you received labwork today, you will receive an invoice from [employer]. Please contact [facility] at [phone] with questions or concerns regarding your invoice.   Our billing staff will not be able to assist you with questions regarding bills from these companies.  You will be contacted with the lab results as soon as they are available. The fastest way to get your results is to activate your My Chart account. Instructions are located on the last page of this paperwork. If you have not heard from [place] regarding the results in 2 weeks, please contact this office.     Dolor De Espalda Crnico (Chronic Back Pain)  Cuando el dolor en la espalda dura ms de 3 meses, se denomina dolor de espalda crnico. Las personas que sufren dolor de espalda crnico generalmente pasan por perodos en los que es ms intenso (brotes).  CAUSAS  El dolor de espalda crnico puede estar originado en el desgaste degeneracin) de las diferentes estructuras de la espalda. Estas estructuras incluyen:  Los  huesos de la columna vertebral (vrtebras) y las articulaciones rodean la mdula espinal y las races nerviosas (facetas).  Hay un tejido fibroso y fuerte que conecta las vrtebras (ligamentos). La degeneracin de estas estructuras provoca presin [employer]. Esto puede causar Manufacturing systems engineer.  INSTRUCCIONES PARA EL CUIDADO EN EL HOGAR   Evite encorvarse, levantar mucho peso, permanecer sentado por [employer], y las actividades que puedan empeorar el problema.  Tome breves perodos de descanso a travs del da para reducir Conservation officer, historic buildings durante los brotes. Recostarse o Public affairs consultant de pie generalmente es mejor que permanecer sentado para Production assistant, radio.  Tome slo medicamentos de venta libre o recetados, segn las indicaciones del mdico. SOLICITE ATENCIN MDICA DE INMEDIATO SI:  Siente debilidad intensa o adormecimiento en una de sus piernas o pies.  Tiene dificultad para controlar la vejiga o el intestino.  Presenta nuseas, vmitos, dolor abdominal, falta de aire o desmayos.   Esta informacin no tiene Marine scientist el consejo del mdico. Asegrese de hacerle al mdico cualquier pregunta que tenga.   Document Released: 07/03/2005 Document Revised: 09/25/2011 Elsevier Interactive Patient Education [employer].

## 2016-04-10 ENCOUNTER — Encounter: Payer: Self-pay | Admitting: Family Medicine

## 2016-05-27 ENCOUNTER — Ambulatory Visit (INDEPENDENT_AMBULATORY_CARE_PROVIDER_SITE_OTHER): Payer: Self-pay | Admitting: Family Medicine

## 2016-05-27 VITALS — BP 122/80 | HR 71 | Temp 98.1°F | Resp 16 | Ht 74.0 in | Wt 190.0 lb

## 2016-05-27 DIAGNOSIS — G894 Chronic pain syndrome: Secondary | ICD-10-CM

## 2016-05-27 DIAGNOSIS — M5442 Lumbago with sciatica, left side: Secondary | ICD-10-CM

## 2016-05-27 DIAGNOSIS — M542 Cervicalgia: Secondary | ICD-10-CM

## 2016-05-27 DIAGNOSIS — G8929 Other chronic pain: Secondary | ICD-10-CM

## 2016-05-27 DIAGNOSIS — J069 Acute upper respiratory infection, unspecified: Secondary | ICD-10-CM

## 2016-05-27 MED ORDER — HYDROCODONE-ACETAMINOPHEN 5-325 MG PO TABS: 1.0000 | ORAL_TABLET | Freq: Four times a day (QID) | ORAL | 0 refills | Status: DC | PRN

## 2016-05-27 MED ORDER — IPRATROPIUM BROMIDE 0.03 % NA SOLN: 2.0000 | Freq: Four times a day (QID) | NASAL | 1 refills | Status: DC

## 2016-05-27 MED ORDER — PROMETHAZINE-CODEINE 6.25-10 MG/5ML PO SYRP: 5.0000 mL | ORAL_SOLUTION | Freq: Four times a day (QID) | ORAL | 0 refills | Status: DC | PRN

## 2016-05-27 NOTE — Patient Instructions (Addendum)
The 100 tabs of hydrocodone is a 3 month supply AT MININUM.  You cannot take these daily or regularly - just save for when you pain flairs prior to work. From your history, it sounds like he would benefit from a higher dose or a more regular regimen, I will be happy to refer you to a pain management clinic - like [facility] [employer].   IF you received an x-ray today, you will receive an invoice from [facility]. Please contact [facility] at [phone] with questions or concerns regarding your invoice.   IF you received labwork today, you will receive an invoice from [employer]. Please contact [facility] at [phone] with questions or concerns regarding your invoice.   Our billing staff will not be able to assist you with questions regarding bills from these companies.  You will be contacted with the lab results as soon as they are available. The fastest way to get your results is to activate your My Chart account. Instructions are located on the last page of this paperwork. If you have not heard from [place] regarding the results in 2 weeks, please contact this office.     1. You will need to be seen for an office visit every 3 months.  Hopefully, all prescriptions can be issued at that time so that you do not need to call in for refill requests 2.  If for some reason, you are not issued all of your prescriptions at a visit, please allow a 72 hour or 3 day window to respond to your refill requests. This is especially important if you need to pick up a prescription over the weekend, so make sure you call during the week prior (e.g. Call by Thursday if you want to be able to pick up a prescription by Sunday). 3. You may only fill your prescriptions at one pharmacy. If you are traveling out of town for some reason during a time that you will need a refill filled, I request that you notify our office and stick to one chain - such was  [facility], [facility], [facility], etc. 4. You may not receive controlled substances from any other provider. If there is an acute condition for which you are seen elsewhere (e.g. you go to the ER after a car accident), please alert the provider that you are on a controlled substance contract and if you are still prescribed a controlled substance, you alert our office within 24 hours of filling it.   5. If you are planning to be seen at our [facility] walk-in clinic for refills, please call ahead of time to confirm that I will be working and please arrive 2 hours prior to the end of my shift to guarantee that you will be seen by myself.  If you are seen at our office for another reason or I am out of the office for an extended period, some providers may be willing to provide a refill on your medications but others are not. It is left up to each provider's discretion at the time of the visit.   6. Please treat our staff with the respect with which you would like to be treated. I know that our medical system can be frustrating but they are doing their job to the best of their abilities as they were instructed.     Chronic Back Pain  When back pain lasts longer than 3 months, it is called chronic back pain.People with chronic back pain  often go through certain periods that are more intense (flare-ups).  CAUSES Chronic back pain can be caused by wear and tear (degeneration) on different structures in your back. These structures include:  The bones of your spine (vertebrae) and the joints surrounding your spinal cord and nerve roots (facets).  The strong, fibrous tissues that connect your vertebrae (ligaments). Degeneration of these structures may result in pressure on your nerves. This can lead to constant pain. HOME CARE INSTRUCTIONS  Avoid bending, heavy lifting, prolonged sitting, and activities which make the problem worse.  Take brief periods of rest throughout the day to reduce your pain. Lying down or  standing usually is better than sitting while you are resting.  Take over-the-counter or prescription medicines only as directed by your caregiver. SEEK IMMEDIATE MEDICAL CARE IF:   You have weakness or numbness in one of your legs or feet.  You have trouble controlling your bladder or bowels.  You have nausea, vomiting, abdominal pain, shortness of breath, or fainting.   This information is not intended to replace advice given to you by your health care provider. Make sure you discuss any questions you have with your health care provider.   Document Released: 08/10/2004 Document Revised: 09/25/2011 Document Reviewed: 12/21/2014 Elsevier Interactive Patient Education [employer].
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
## 2016-05-30 ENCOUNTER — Ambulatory Visit (INDEPENDENT_AMBULATORY_CARE_PROVIDER_SITE_OTHER): Payer: Self-pay | Admitting: Family Medicine

## 2016-05-30 VITALS — BP 120/74 | HR 67 | Temp 98.2°F | Resp 16 | Ht 74.0 in | Wt 191.0 lb

## 2016-05-30 DIAGNOSIS — J029 Acute pharyngitis, unspecified: Secondary | ICD-10-CM

## 2016-05-30 DIAGNOSIS — J019 Acute sinusitis, unspecified: Secondary | ICD-10-CM

## 2016-05-30 LAB — POCT RAPID STREP A (OFFICE): RAPID STREP A SCREEN: NEGATIVE

## 2016-05-30 MED ORDER — HYDROCODONE-HOMATROPINE 5-1.5 MG/5ML PO SYRP: 5.0000 mL | ORAL_SOLUTION | Freq: Three times a day (TID) | ORAL | 0 refills | Status: DC | PRN

## 2016-05-30 MED ORDER — AMOXICILLIN 500 MG PO CAPS: 1000.0000 mg | ORAL_CAPSULE | Freq: Two times a day (BID) | ORAL | 0 refills | Status: DC

## 2016-05-30 NOTE — Progress Notes (Signed)
Subjective:  By signing my name below, I, Moises Blood, attest that this documentation has been prepared under the direction and in the presence of Delman Cheadle, MD. Electronically Signed: Moises Blood, [place]. 05/30/2016 , 12:25 PM .  Patient was seen in Room 13 .   Patient ID: Jerry Steele, male    DOB: 04-17-1966, 50 y.o.   MRN: [id number] Chief Complaint  Patient presents with  . Follow-up    seen 11/11, sob, sore throat, mouth dry, body aches, chills, hot, sweaty last night   . Headache   HPI Jerry Steele is a 50 y.o. male who presents to [facility] for follow up. He was seen 3 days ago, and thought he had URI. Started him on atrovent nasal spray, promethazine and codeine cough syrup. Here due to worsening of symptoms.   Patient states he's still running fevers. Yesterday, he was in his bed, but still feeling chills. His mouth feels really dry and isn't able to breathe. He's still coughing up phlegm with sinus pain when he does cough. He hasn't been able to eat solid foods. He's been drinking Gatorade and plans to buy more after today's visit. He also reports sore lymph nodes, right worse than left. He's able to sleep but wakes up 2~3 times a night due to coughing. He believes the codeine syrup is not strong enough. He denies rhinorrhea.   History reviewed. No pertinent past medical history. Prior to Admission medications   Medication Sig Start Date End Date Taking? Authorizing Provider  butalbital-acetaminophen-caffeine (FIORICET, ESGIC) [phone] MG tablet Take 1-2 tablets by mouth every 6 (six) hours as needed for headache. 03/31/16 03/31/17  Shawnee Knapp, MD  HYDROcodone-acetaminophen (NORCO/VICODIN) 5-325 MG tablet Take 1 tablet by mouth every 6 (six) hours as needed for moderate pain. 05/27/16   Shawnee Knapp, MD  ipratropium (ATROVENT) 0.03 % nasal spray Place 2 sprays into the nose 4 (four) times daily. 05/27/16   Shawnee Knapp, MD  Lactulose 20 GM/30ML SOLN Take 30 mLs  (20 g total) by mouth 2 (two) times daily as needed (constipation). 03/31/16   Shawnee Knapp, MD  promethazine-codeine (PHENERGAN WITH CODEINE) 6.25-10 MG/5ML syrup Take 5 mLs by mouth every 6 (six) hours as needed for cough. 05/27/16   Shawnee Knapp, MD  RABEprazole (ACIPHEX) 20 MG tablet Take 1 tablet (20 mg total) by mouth daily. 30 minutes before breakfast 03/31/16   Shawnee Knapp, MD  sildenafil (REVATIO) 20 MG tablet Use as instructed by physician. 1 to 3 tabs daily as needed 03/31/16   Shawnee Knapp, MD   No Known Allergies   Review of Systems  Constitutional: Positive for appetite change, chills, fatigue and fever.  HENT: Positive for congestion and sinus pain. Negative for rhinorrhea.   Respiratory: Positive for cough and shortness of breath. Negative for chest tightness and wheezing.   Cardiovascular: Negative for chest pain.  Musculoskeletal: Positive for myalgias.  Neurological: Positive for headaches.  Psychiatric/Behavioral: Positive for sleep disturbance.       Objective:   Physical Exam  Constitutional: He is oriented to person, place, and time. He appears well-developed and well-nourished. No distress.  HENT:  Head: Normocephalic and atraumatic.  Right Ear: Tympanic membrane normal.  Left Ear: Tympanic membrane is injected. A middle ear effusion is present.  Nose: Nose normal.  Mouth/Throat: Oropharyngeal exudate (right side), posterior oropharyngeal edema and posterior oropharyngeal erythema present.  Eyes: EOM are normal. Pupils are equal, round, and reactive to light.  Neck: Neck supple.  Cardiovascular: Normal rate, regular rhythm, S1 normal, S2 normal and normal heart sounds.  Exam reveals no gallop and no friction rub.   No murmur heard. Pulmonary/Chest: Effort normal and breath sounds normal. No respiratory distress. He has no wheezes.  Musculoskeletal: Normal range of motion.  Neurological: He is alert and oriented to person, place, and time.  Skin: Skin is warm and dry.    Psychiatric: He has a normal mood and affect. His behavior is normal.  Nursing note and vitals reviewed.   BP 120/74 (BP Location: Right Arm, Patient Position: Sitting, Cuff Size: Small)   Pulse 67   Temp 98.2 F (36.8 C) (Oral)   Resp 16   Ht 6\' 2"  (1.88 m)   Wt 191 lb (86.6 kg)   SpO2 99%   BMI 24.52 kg/m     Assessment & Plan:   1. Acute non-recurrent sinusitis, unspecified location   2. Acute pharyngitis, unspecified etiology     Orders Placed This Encounter  Procedures  . POCT rapid strep A    Meds ordered this encounter  Medications  . HYDROcodone-homatropine (HYCODAN) 5-1.5 MG/5ML syrup    Sig: Take 5 mLs by mouth every 8 (eight) hours as needed for cough.    Dispense:  180 mL    Refill:  0  . amoxicillin (AMOXIL) 500 MG capsule    Sig: Take 2 capsules (1,000 mg total) by mouth 2 (two) times daily.    Dispense:  40 capsule    Refill:  0    I personally performed the services described in this documentation, which was scribed in my presence. The recorded information has been reviewed and considered, and addended by me as needed.   Delman Cheadle, M.D.  [facility] [address] [place], [place] [address] [phone] phone [phone] fax  06/16/16 9:13 AM

## 2016-05-30 NOTE — Patient Instructions (Addendum)
IF you received an x-ray today, you will receive an invoice from [facility]. Please contact [facility] at [phone] with questions or concerns regarding your invoice.   IF you received labwork today, you will receive an invoice from [employer]. Please contact [facility] at [phone] with questions or concerns regarding your invoice.   Our billing staff will not be able to assist you with questions regarding bills from these companies.  You will be contacted with the lab results as soon as they are available. The fastest way to get your results is to activate your My Chart account. Instructions are located on the last page of this paperwork. If you have not heard from [place] regarding the results in 2 weeks, please contact this office.      Sinusitis, Adult Sinusitis is soreness and inflammation of your sinuses. Sinuses are hollow spaces in the bones around your face. Your sinuses are located:  Around your eyes.  In the middle of your forehead.  Behind your nose.  In your cheekbones. Your sinuses and nasal passages are lined with a stringy fluid (mucus). Mucus normally drains out of your sinuses. When your nasal tissues become inflamed or swollen, the mucus can become trapped or blocked so air cannot flow through your sinuses. This allows bacteria, viruses, and funguses to grow, which leads to infection. Sinusitis can develop quickly and last for 7?10 days (acute) or for more than 12 weeks (chronic). Sinusitis often develops after a cold. What are the causes? This condition is caused by anything that creates swelling in the sinuses or stops mucus from draining, including:  Allergies.  Asthma.  Bacterial or viral infection.  Abnormally shaped bones between the nasal passages.  Nasal growths that contain mucus (nasal polyps).  Narrow sinus openings.  Pollutants, such as chemicals or irritants in the air.  A foreign object  stuck in the nose.  A fungal infection. This is rare. What increases the risk? The following factors may make you more likely to develop this condition:  Having allergies or asthma.  Having had a recent cold or respiratory tract infection.  Having structural deformities or blockages in your nose or sinuses.  Having a weak immune system.  Doing a lot of swimming or diving.  Overusing nasal sprays.  Smoking. What are the signs or symptoms? The main symptoms of this condition are pain and a feeling of pressure around the affected sinuses. Other symptoms include:  Upper toothache.  Earache.  Headache.  Bad breath.  Decreased sense of smell and taste.  A cough that may get worse at night.  Fatigue.  Fever.  Thick drainage from your nose. The drainage is often green and it may contain pus (purulent).  Stuffy nose or congestion.  Postnasal drip. This is when extra mucus collects in the throat or back of the nose.  Swelling and warmth over the affected sinuses.  Sore throat.  Sensitivity to light. How is this diagnosed? This condition is diagnosed based on symptoms, a medical history, and a physical exam. To find out if your condition is acute or chronic, your health care provider may:  Look in your nose for signs of nasal polyps.  Tap over the affected sinus to check for signs of infection.  View the inside of your sinuses using an imaging device that has a light attached (endoscope). If your health care provider suspects that you have chronic sinusitis, you may also:  Be tested for allergies.  Have  a sample of mucus taken from your nose (nasal culture) and checked for bacteria.  Have a mucus sample examined to see if your sinusitis is related to an allergy. If your sinusitis does not respond to treatment and it lasts longer than 8 weeks, you may have an MRI or CT scan to check your sinuses. These scans also help to determine how severe your infection is. In  rare cases, a bone biopsy may be done to rule out more serious types of fungal sinus disease. How is this treated? Treatment for sinusitis depends on the cause and whether your condition is chronic or acute. If a virus is causing your sinusitis, your symptoms will go away on their own within 10 days. You may be given medicines to relieve your symptoms, including:  Topical nasal decongestants. They shrink swollen nasal passages and let mucus drain from your sinuses.  Antihistamines. These drugs block inflammation that is triggered by allergies. This can help to ease swelling in your nose and sinuses.  Topical nasal corticosteroids. These are nasal sprays that ease inflammation and swelling in your nose and sinuses.  Nasal saline washes. These rinses can help to get rid of thick mucus in your nose. If your condition is caused by bacteria, you will be given an antibiotic medicine. If your condition is caused by a fungus, you will be given an antifungal medicine. Surgery may be needed to correct underlying conditions, such as narrow nasal passages. Surgery may also be needed to remove polyps. Follow these instructions at home: Medicines  Take, use, or apply over-the-counter and prescription medicines only as told by your health care provider. These may include nasal sprays.  If you were prescribed an antibiotic medicine, take it as told by your health care provider. Do not stop taking the antibiotic even if you start to feel better. Hydrate and Humidify  Drink enough water to keep your urine clear or pale yellow. Staying hydrated will help to thin your mucus.  Use a cool mist humidifier to keep the humidity level in your home above 50%.  Inhale steam for 10-15 minutes, 3-4 times a day or as told by your health care provider. You can do this in the bathroom while a hot shower is running.  Limit your exposure to cool or dry air. Rest  Rest as much as possible.  Sleep with your head raised  (elevated).  Make sure to get enough sleep each night. General instructions  Apply a warm, moist washcloth to your face 3-4 times a day or as told by your health care provider. This will help with discomfort.  Wash your hands often with soap and water to reduce your exposure to viruses and other germs. If soap and water are not available, use hand sanitizer.  Do not smoke. Avoid being around people who are smoking (secondhand smoke).  Keep all follow-up visits as told by your health care provider. This is important. Contact a health care provider if:  You have a fever.  Your symptoms get worse.  Your symptoms do not improve within 10 days. Get help right away if:  You have a severe headache.  You have persistent vomiting.  You have pain or swelling around your face or eyes.  You have vision problems.  You develop confusion.  Your neck is stiff.  You have trouble breathing. This information is not intended to replace advice given to you by your health care provider. Make sure you discuss any questions you have with your  health care provider. Document Released: 07/03/2005 Document Revised: 02/27/2016 Document Reviewed: 04/28/2015 Elsevier Interactive Patient Education  2017 [employer].

## 2016-07-31 ENCOUNTER — Telehealth: Payer: Self-pay

## 2016-07-31 NOTE — Telephone Encounter (Signed)
Rabeprozole is to expensive -

## 2016-08-01 NOTE — Telephone Encounter (Signed)
Failed nexium prior, aciphex expensive Sent in protonix

## 2016-08-01 NOTE — Telephone Encounter (Signed)
Can we write and send a cheaper med?

## 2016-08-09 MED ORDER — PANTOPRAZOLE SODIUM 40 MG PO TBEC: 40.0000 mg | DELAYED_RELEASE_TABLET | Freq: Every day | ORAL | 3 refills | Status: DC

## 2016-12-07 ENCOUNTER — Encounter: Payer: Self-pay | Admitting: Physician Assistant

## 2016-12-07 ENCOUNTER — Ambulatory Visit (INDEPENDENT_AMBULATORY_CARE_PROVIDER_SITE_OTHER): Payer: Self-pay | Admitting: Physician Assistant

## 2016-12-07 VITALS — BP 128/75 | HR 79 | Temp 97.8°F | Resp 18 | Ht 74.0 in | Wt 188.6 lb

## 2016-12-07 DIAGNOSIS — N529 Male erectile dysfunction, unspecified: Secondary | ICD-10-CM

## 2016-12-07 DIAGNOSIS — K219 Gastro-esophageal reflux disease without esophagitis: Secondary | ICD-10-CM

## 2016-12-07 MED ORDER — SILDENAFIL CITRATE 20 MG PO TABS: ORAL_TABLET | ORAL | 5 refills | Status: DC

## 2016-12-07 MED ORDER — PANTOPRAZOLE SODIUM 40 MG PO TBEC: 40.0000 mg | DELAYED_RELEASE_TABLET | Freq: Every day | ORAL | 3 refills | Status: DC

## 2016-12-07 NOTE — Patient Instructions (Addendum)
   Food Choices for Gastroesophageal Reflux Disease, Adult When you have gastroesophageal reflux disease (GERD), the foods you eat and your eating habits are very important. Choosing the right foods can help ease your discomfort. What guidelines do I need to follow?  Choose fruits, vegetables, whole grains, and low-fat dairy products.  Choose low-fat meat, fish, and poultry.  Limit fats such as oils, salad dressings, butter, nuts, and avocado.  Keep a food diary. This helps you identify foods that cause symptoms.  Avoid foods that cause symptoms. These may be different for everyone.  Eat small meals often instead of 3 large meals a day.  Eat your meals slowly, in a place where you are relaxed.  Limit fried foods.  Cook foods using methods other than frying.  Avoid drinking alcohol.  Avoid drinking large amounts of liquids with your meals.  Avoid bending over or lying down until 2-3 hours after eating. What foods are not recommended? These are some foods and drinks that may make your symptoms worse: Vegetables Tomatoes. Tomato juice. Tomato and spaghetti sauce. Chili peppers. Onion and garlic. Horseradish. Fruits Oranges, grapefruit, and lemon (fruit and juice). Meats High-fat meats, fish, and poultry. This includes hot dogs, ribs, ham, sausage, salami, and bacon. Dairy Whole milk and chocolate milk. Sour cream. Cream. Butter. Ice cream. Cream cheese. Drinks Coffee and tea. Bubbly (carbonated) drinks or energy drinks. Condiments Hot sauce. Barbecue sauce. Sweets/Desserts Chocolate and cocoa. Donuts. Peppermint and spearmint. Fats and Oils High-fat foods. This includes French fries and potato chips. Other Vinegar. Strong spices. This includes black pepper, white pepper, red pepper, cayenne, curry powder, cloves, ginger, and chili powder. The items listed above may not be a complete list of foods and drinks to avoid. Contact your dietitian for more information. This  information is not intended to replace advice given to you by your health care provider. Make sure you discuss any questions you have with your health care provider. Document Released: 01/02/2012 Document Revised: 12/09/2015 Document Reviewed: 05/07/2013 Elsevier Interactive Patient Education  2017 [employer] Inc[employer]   IF you received an x-ray today, you will receive an invoice from [facility]. Please contact [facility] at [phone] with questions or concerns regarding your invoice.   IF you received labwork today, you will receive an invoice from [facility]. Please contact [facility] at [phone] with questions or concerns regarding your invoice.   Our billing staff will not be able to assist you with questions regarding bills from these companies.  You will be contacted with the lab results as soon as they are available. The fastest way to get your results is to activate your My Chart account. Instructions are located on the last page of this paperwork. If you have not heard from us regarding the results in 2 weeks, please contact this office.      

## 2016-12-07 NOTE — Progress Notes (Signed)
PRIMARY CARE AT [facility] [address], [place] [address] [phone]  Date:  12/07/2016   Name:  Jerry Steele   DOB:  1966-03-02   MRN:  [id number]  PCP:  Shawnee Knapp, MD    History of Present Illness:  Jerry Steele is a 51 y.o. male patient who presents to PCP with  Chief Complaint  Patient presents with  . Medication Refill    revatio and Vicodin      Patient reports refill need of the revatio and reflux medication.  He is not following appropriate gerd diet.  He reports that he has heartburn uncontrolled.  He is currently using an anti-gerd medicine, which is likely protonix, but he can not confirm.  He is eating spicy foods, stating "I am mexican-italian".   ED: requesting refill of revatio.  No se to the medication.  Reports headache with viagra.   He denies need for refill of vicodin.  He states that he has a lot left.      There are no active problems to display for this patient.   No past medical history on file.  No past surgical history on file.  Social History  Substance Use Topics  . Smoking status: Current Every Day Smoker  . Smokeless tobacco: Never Used  . Alcohol use No    No family history on file.  No Known Allergies  Medication list has been reviewed and updated.  Current Outpatient Prescriptions on File Prior to Visit  Medication Sig Dispense Refill  . amoxicillin (AMOXIL) 500 MG capsule Take 2 capsules (1,000 mg total) by mouth 2 (two) times daily. 40 capsule 0  . butalbital-acetaminophen-caffeine (FIORICET, ESGIC) 50-325-40 MG tablet Take 1-2 tablets by mouth every 6 (six) hours as needed for headache. 20 tablet 0  . HYDROcodone-acetaminophen (NORCO/VICODIN) 5-325 MG tablet Take 1 tablet by mouth every 6 (six) hours as needed for moderate pain. 100 tablet 0  . HYDROcodone-homatropine (HYCODAN) 5-1.5 MG/5ML syrup Take 5 mLs by mouth every 8 (eight) hours as needed for cough. 180 mL 0  . ipratropium (ATROVENT) 0.03 %  nasal spray Place 2 sprays into the nose 4 (four) times daily. 30 mL 1  . Lactulose 20 GM/30ML SOLN Take 30 mLs (20 g total) by mouth 2 (two) times daily as needed (constipation). 300 mL 3  . pantoprazole (PROTONIX) 40 MG tablet Take 1 tablet (40 mg total) by mouth daily. 30 tablet 3  . sildenafil (REVATIO) 20 MG tablet Use as instructed by physician. 1 to 3 tabs daily as needed 30 tablet 5   No current facility-administered medications on file prior to visit.     ROS ROS otherwise unremarkable unless listed above.  Physical Examination: BP 128/75   Pulse 79   Temp 97.8 F (36.6 C) (Oral)   Resp 18   Ht 6\' 2"  (1.88 m)   Wt 188 lb 9.6 oz (85.5 kg)   SpO2 97%   BMI 24.21 kg/m  Ideal Body Weight: Weight in (lb) to have BMI = 25: 194.3  Physical Exam  Constitutional: He is oriented to person, place, and time. He appears well-developed and well-nourished. No distress.  HENT:  Head: Normocephalic and atraumatic.  Eyes: Conjunctivae and EOM are normal.  Cardiovascular: Normal rate.   Pulmonary/Chest: Effort normal. No respiratory distress.  Neurological: He is alert and oriented to person, place, and time.  Skin: Skin is warm and dry. He is not diaphoretic.  Psychiatric: He has a normal mood and affect.  His behavior is normal.     Assessment and Plan: [name] is a 51 y.o. male who is here today for medication refill of revatio and protonix. Given samples of nexium.  Refilling protonix however. Erectile dysfunction, unspecified erectile dysfunction type  Gastroesophageal reflux disease, esophagitis presence not specified  Ivar Drape, PA-C [facility] 5/27/20189:03 AM

## 2017-01-20 ENCOUNTER — Ambulatory Visit: Payer: Self-pay | Admitting: Family Medicine

## 2017-02-17 ENCOUNTER — Encounter: Payer: Self-pay | Admitting: Family Medicine

## 2017-02-17 ENCOUNTER — Ambulatory Visit (INDEPENDENT_AMBULATORY_CARE_PROVIDER_SITE_OTHER): Payer: Self-pay | Admitting: Family Medicine

## 2017-02-17 VITALS — BP 110/73 | HR 80 | Temp 97.8°F | Resp 18 | Ht 74.0 in | Wt 188.2 lb

## 2017-02-17 DIAGNOSIS — M7541 Impingement syndrome of right shoulder: Secondary | ICD-10-CM

## 2017-02-17 DIAGNOSIS — R058 Other specified cough: Secondary | ICD-10-CM

## 2017-02-17 DIAGNOSIS — R05 Cough: Secondary | ICD-10-CM

## 2017-02-17 DIAGNOSIS — M542 Cervicalgia: Secondary | ICD-10-CM

## 2017-02-17 DIAGNOSIS — M62838 Other muscle spasm: Secondary | ICD-10-CM

## 2017-02-17 MED ORDER — HYDROCODONE-ACETAMINOPHEN 5-325 MG PO TABS: 1.0000 | ORAL_TABLET | Freq: Four times a day (QID) | ORAL | 0 refills | Status: DC | PRN

## 2017-02-17 MED ORDER — HYDROCODONE-ACETAMINOPHEN 5-325 MG PO TABS: 1.0000 | ORAL_TABLET | ORAL | 0 refills | Status: DC | PRN

## 2017-02-17 MED ORDER — CETIRIZINE HCL 10 MG PO TABS: 10.0000 mg | ORAL_TABLET | Freq: Every day | ORAL | 11 refills | Status: DC

## 2017-02-17 MED ORDER — HYDROCODONE-HOMATROPINE 5-1.5 MG/5ML PO SYRP: 5.0000 mL | ORAL_SOLUTION | ORAL | 0 refills | Status: DC | PRN

## 2017-02-17 MED ORDER — FLUTICASONE PROPIONATE 50 MCG/ACT NA SUSP: 2.0000 | Freq: Every day | NASAL | 2 refills | Status: DC

## 2017-02-17 NOTE — Progress Notes (Signed)
By signing my name below, I, Mesha Guinyard, attest that this documentation has been prepared under the direction and in the presence of Delman Cheadle, MD.  Electronically Signed: Verlee Monte, Medical Scribe. 02/17/17. 3:03 PM.  Subjective:    Patient ID: Jerry Steele, male    DOB: [birth year], 51 y.o.   MRN: [id number]  HPI Chief Complaint  Patient presents with  . Cough    dry cough for a couple weeks now. A lot of postnasal drip. Possible PNA   . Chest Pain    sternum pain feels like he is being stabbed.    HPI Comments: Jerry Steele is a 51 y.o. male who presents to Primary Care at [facility] complaining of cough and chest pain.  Cough: Pt reports dry cough for 4-5 weeks. In the morning he'll cough 3x. Pt lives in a basement that's painted wired and he isn't sure if there is mold in there. He only started coughing after living there. He has lived there for 3 years. Denies sinus pain or pressure, metallic taste in his mouth in the morning, numbness or weakness in his arm  Neck Pain: Pt reports neck pain in his C5 after accident. Pt was in a coma for 3 days and went to PT for 6 months. He reports associated sxs of right shoulder pain that radiates to right upper chest/shoulder, and sharp chest pain last week. Reports having accident in 2006 and 2012.   There are no active problems to display for this patient.  History reviewed. No pertinent past medical history. History reviewed. No pertinent surgical history. No Known Allergies Prior to Admission medications   Medication Sig Start Date End Date Taking? Authorizing Provider  HYDROcodone-homatropine (HYCODAN) 5-1.5 MG/5ML syrup Take 5 mLs by mouth every 8 (eight) hours as needed for cough. 05/30/16  Yes Shawnee Knapp, MD  ipratropium (ATROVENT) 0.03 % nasal spray Place 2 sprays into the nose 4 (four) times daily. 05/27/16  Yes Shawnee Knapp, MD  pantoprazole (PROTONIX) 40 MG tablet Take 1 tablet (40 mg total) by mouth  daily. 12/07/16  Yes Ivar Drape D, PA  sildenafil (REVATIO) 20 MG tablet Use as instructed by physician. 1 to 3 tabs daily as needed 12/07/16  Yes English, Regino Ramirez D, PA  amoxicillin (AMOXIL) 500 MG capsule Take 2 capsules (1,000 mg total) by mouth 2 (two) times daily. Patient not taking: Reported on 02/17/2017 05/30/16   Shawnee Knapp, MD  butalbital-acetaminophen-caffeine (FIORICET, ESGIC) [phone] MG tablet Take 1-2 tablets by mouth every 6 (six) hours as needed for headache. Patient not taking: Reported on 02/17/2017 03/31/16 03/31/17  Shawnee Knapp, MD  HYDROcodone-acetaminophen (NORCO/VICODIN) 5-325 MG tablet Take 1 tablet by mouth every 6 (six) hours as needed for moderate pain. Patient not taking: Reported on 02/17/2017 05/27/16   Shawnee Knapp, MD  Lactulose 20 GM/30ML SOLN Take 30 mLs (20 g total) by mouth 2 (two) times daily as needed (constipation). Patient not taking: Reported on 02/17/2017 03/31/16   Shawnee Knapp, MD   Social History   Social History  . Marital status: Single    Spouse name: N/A  . Number of children: N/A  . Years of education: N/A   Occupational History  . Not on file.   Social History Main Topics  . Smoking status: Current Every Day Smoker  . Smokeless tobacco: Never Used  . Alcohol use No  . Drug use: No  . Sexual activity: Not on file   Other Topics  Concern  . Not on file   Social History Narrative  . No narrative on file   Depression screen [facility] 2/9 02/17/2017 05/30/2016 05/27/2016 03/31/2016 01/25/2016  Decreased Interest 0 0 0 0 0  Down, Depressed, Hopeless 0 0 0 0 0  PHQ - 2 Score 0 0 0 0 0    Review of Systems  HENT: Negative for sinus pain and sinus pressure.   Respiratory: Positive for cough.   Cardiovascular: Positive for chest pain.  Gastrointestinal: Negative for abdominal pain.  Musculoskeletal: Positive for arthralgias and neck pain.  Neurological: Negative for weakness and numbness.   Objective:  Physical Exam  Constitutional: He  appears well-developed and well-nourished. No distress.  HENT:  Head: Normocephalic and atraumatic.  Right Ear: Tympanic membrane is retracted.  Left Ear: Tympanic membrane is injected and retracted.  Mouth/Throat: Oropharyngeal exudate and posterior oropharyngeal erythema present.  clear post nasal drip in oropharynx  Eyes: Conjunctivae are normal.  Neck: Neck supple. No thyroid mass and no thyromegaly present.  Cardiovascular: Normal rate, S1 normal, S2 normal and normal heart sounds.  Exam reveals no gallop and no friction rub.   No murmur heard. Pulmonary/Chest: Effort normal and breath sounds normal. No respiratory distress. He has no wheezes. He has no rales.  Sternum stable No TTP over costal sternal joints  Musculoskeletal:  Tight cervical paraspinals R>L TTP over low cervical spinous process TTP over the right rhomboid TTP over right acromial bursa Right shoulder gurdle TTP No tenderness over the [facility] joint Right shoulder held slight lower with abduction   Lymphadenopathy:       Head (right side): Tonsillar adenopathy present.       Head (left side): Tonsillar adenopathy present.  L>R tonsillar nodes Anterior cervical R>L  Neurological: He is alert.  Reflex Scores:      Tricep reflexes are 1+ on the left side.      Bicep reflexes are 1+ on the left side.      Brachioradialis reflexes are 1+ on the left side. Unable to illicit tricep, bicep, and brachioradialis DTRs on the right Upper extremity strength 5/5 bilaterally Slight weakness 4/5 subscapularis Slight weakness on right external rotation  Skin: Skin is warm and dry.  Psychiatric: He has a normal mood and affect. His behavior is normal.  Nursing note and vitals reviewed.   Vitals:   02/17/17 1451  BP: 110/73  Pulse: 80  Resp: 18  Temp: 97.8 F (36.6 C)  TempSrc: Oral  SpO2: 99%  Weight: 188 lb 3.2 oz (85.4 kg)  Height: 6\' 2"  (1.88 m)   Body mass index is 24.16 kg/m. Assessment & Plan:   1.  Impingement syndrome of right shoulder - ice, home exercises with therapy band. If cont, RTC to consider xray. Consider injection of acromial bursa but pt is self-pay  2. Upper airway cough syndrome   3. Cervicalgia   4. Cervical paraspinal muscle spasm    Aware not to fill or use cough syrup or hydrocodone for acute pain simultaneously.  Meds ordered this encounter  Medications  . DISCONTD: HYDROcodone-acetaminophen (NORCO/VICODIN) 5-325 MG tablet    Sig: Take 1 tablet by mouth every 6 (six) hours as needed for moderate pain.    Dispense:  100 tablet    Refill:  0  . HYDROcodone-homatropine (HYCODAN) 5-1.5 MG/5ML syrup    Sig: Take 5 mLs by mouth every 4 (four) hours as needed for cough.    Dispense:  180 mL    Refill:  0  . HYDROcodone-acetaminophen (NORCO/VICODIN) 5-325 MG tablet    Sig: Take 1-2 tablets by mouth every 4 (four) hours as needed for moderate pain.    Dispense:  60 tablet    Refill:  0  . fluticasone (FLONASE) 50 MCG/ACT nasal spray    Sig: Place 2 sprays into both nostrils at bedtime.    Dispense:  16 g    Refill:  2  . cetirizine (ZYRTEC) 10 MG tablet    Sig: Take 1 tablet (10 mg total) by mouth at bedtime.    Dispense:  30 tablet    Refill:  11    I personally performed the services described in this documentation, which was scribed in my presence. The recorded information has been reviewed and considered, and addended by me as needed.   Delman Cheadle, M.D.  Primary Care at [facility] [place] [address] [place],  [address] [phone] phone [phone] fax  02/19/17 11:54 PM

## 2017-02-17 NOTE — Patient Instructions (Addendum)
Use the cetirizine and the flonase nasal spray every night for AT LEAST 2 weeks to treat allergies that may be causing the mucous dripping down your throat leading to the cough. If it is coming from mold in your apartment, you may just want to stay on these for a long term or else the cough will eventually come back.  Ice your shoulder 3x a day for 15 minutes (before work, after work, before bed.)  Start the home exercises.  Start the meloxicam every morning for the next 6 weeks.     IF you received an x-ray today, you will receive an invoice from [facility]. Please contact [facility] at [phone] with questions or concerns regarding your invoice.   IF you received labwork today, you will receive an invoice from [place]. Please contact [facility] at [phone] with questions or concerns regarding your invoice.   Our billing staff will not be able to assist you with questions regarding bills from these companies.  You will be contacted with the lab results as soon as they are available. The fastest way to get your results is to activate your My Chart account. Instructions are located on the last page of this paperwork. If you have not heard from [place] regarding the results in 2 weeks, please contact this office.      Sndrome de pinzamiento del hombro con rehabilitacin (Shoulder Impingement Syndrome With Rehab) El sndrome de pinzamiento del hombro es una afeccin que causa dolor cuando se comprimen los tejidos conjuntivos (tendones) que rodean el hombro. Estos tendones son parte de un grupo de msculos y tejidos que ayudan a Research scientist (medical) hombro (manguito rotador). Por detrs del Clinical research associate, hay un saco lleno de lquido (bolsa) que permite que los msculos y los tendones se deslicen suavemente. La bolsa puede hincharse o inflamarse (bursitis). La bursitis o la hinchazn en los tendones del manguito rotador, o ambos trastornos, pueden disminuir el espacio que hay debajo  del hueso en la articulacin del hombro (acromion), lo que causa pinzamiento. CAUSAS El sndrome de pinzamiento del hombro puede deberse a la bursitis o a la hinchazn en los tendones del manguito rotador, que se producen por lo siguiente:  Movimientos repetidos del brazo por encima del hombro.  Una cada sobre el hombro.  Debilidad de los msculos del hombro. FACTORES DE RIESGO Es ms probable que desarrolle este tipo de afeccin si es un atleta que participa en uno de estos deportes:  Deportes que requieren Personnel officer, como bisbol.  Tenis.  Natacin.  Vleibol. Algunas personas tienen ms probabilidades de sufrir el sndrome de pinzamiento debido a la forma del hueso del acromion. SNTOMAS El sntoma principal de esta afeccin es el dolor en la parte de adelante o del costado del hombro. El dolor:  Empeora al levantar el brazo.  Empeora por la noche.  Le interrumpe el sueo.  Es ms agudo al [employer] hombro y luego se transforma en [place]. Otros signos y sntomas pueden incluir los siguientes:  Dolor a Secretary/administrator.  Rigidez.  Imposibilidad de levantar el brazo por arriba del nivel del hombro o detrs del cuerpo.  Debilidad. DIAGNSTICO Esta afeccin se puede diagnosticar en funcin de lo siguiente:  Sus sntomas.  Sus antecedentes mdicos.  Un examen fsico.  Pruebas de diagnstico por imgenes, como: ? Radiografas. ? Resonancia magntica (RM). ? Ecografa. TRATAMIENTO El tratamiento de esta afeccin puede incluir lo siguiente:  Contractor hombro en reposo y Product/process development scientist todas las actividades que causen dolor  o requieran un esfuerzo con el hombro.  Aplicar hielo en el hombro.  Tomar antiinflamatorios no esteroides Dayna Ramus) para ayudar a Dietitian y la hinchazn.  Aplicarse una o ms inyecciones de medicamentos para anestesiar la zona y Risk manager.  Realizar fisioterapia.  Someterse a [place]. Tal vez se necesite una  ciruga si los tratamientos no quirrgicos no han dado resultado. La ciruga puede consistir en reparar el manguito rotador, modificar la forma del acromion o extraer la bolsa. INSTRUCCIONES PARA EL CUIDADO EN EL HOGAR Control del dolor, la rigidez y la hinchazn  Si se lo indican, aplique hielo sobre la zona lesionada. ? Ponga el hielo en una bolsa plstica. ? Coloque una [employer] piel y la bolsa de hielo. ? Coloque el hielo durante 75minutos, 2 a 3veces por da. Actividad  Descanse y retome sus actividades normales como se lo haya indicado el mdico. Pregntele al mdico qu actividades son seguras para usted.  Haga ejercicios como se lo haya indicado el mdico. Instrucciones generales  No consuma ningn producto que contenga tabaco, lo que incluye cigarrillos, tabaco de Higher education careers adviser o Psychologist, sport and exercise. El tabaco puede retrasar la cicatrizacin. Si necesita ayuda para dejar de fumar, consulte al [employer].  Pregntele al mdico cundo es seguro volver a Forensic psychologist.  Tome los medicamentos de venta libre y los recetados solamente como se lo haya indicado el mdico.  Consulting civil engineer a todas las visitas de control como se lo haya indicado el mdico. Esto es importante. PREVENCIN  Dele a su cuerpo tiempo para [employer] perodos de [place].  Tome medidas de seguridad y sea responsable al hacer [place], para evitar las cadas.  Mantenga un buen estado fsico, lo que incluye la flexibilidad y Pharmacologist. SOLICITE ATENCIN MDICA SI:  Los sntomas no mejoran despus de 1 a 55meses de tratamiento y reposo.  No puede levantar el brazo para alejarlo del cuerpo. Esta informacin no tiene Marine scientist el consejo del mdico. Asegrese de hacerle al mdico cualquier pregunta que tenga. Document Released: 04/19/2006 Document Revised: 03/24/2015 Document Reviewed: 06/05/2015 Elsevier Interactive Patient Education  2018 [place] pinzamiento del hombro  con rehabilitacin Shoulder Impingement Syndrome Rehab Consulte al mdico qu ejercicios son seguros para usted. Haga los ejercicios exactamente como se lo haya indicado el mdico y gradelos como se lo hayan indicado. Es normal sentir tirantez, tensin, presin o molestias leves mientras hace estos ejercicios, pero debe detenerse de inmediato si siente dolor repentino o si el dolor empeora.No comience a hacer estos ejercicios hasta que se lo indique el mdico. EJERCICIO DE [employer] Y AMPLITUD DE MOVIMIENTOS Este ejercicio calienta los msculos y las articulaciones, y mejora la movilidad y la flexibilidad del hombro. Adems, ayuda a Best boy y la rigidez. EjercicioA: Aduccin horizontal, pasivo 1. Sintese o prese, y lleve el brazo izquierdo/derecho con el codo extendido [employer] hombro opuesto, cruzando el pecho. Detngase cuando sienta una elongacin suave atrs del hombro y la parte superior del brazo.  Mantenga el brazo a la altura del hombro.  Mantenga el brazo lo ms cerca que pueda de su cuerpo, sin Psychiatrist. 1. Mantenga esta posicin durante __________ segundos. 2. Vuelva lentamente a la posicin inicial. Repita __________ veces. Realice este ejercicio __________ veces al da. EJERCICIOS DE FORTALECIMIENTO Estos ejercicios fortalecen el hombro y le otorgan resistencia. La resistencia es la capacidad de usar los msculos durante un tiempo prolongado, incluso despus de que se cansen.  EjercicioB: Rotacin externa, isometra 1. Prese o sintese en la entrada de [place], de frente al marco de la puerta. 2. Flexione el codo izquierdo/derecho y Geneticist, molecular de atrs de la mueca contra el marco de la [place]. Solo la [employer] debe tocar el [place] parte superior del brazo al costado del cuerpo. 3. Presione suavemente la [employer] contra el marco de la [place], como si tratara de empujar el brazo en direccin contraria al abdomen.  Evite encoger el hombro  mientras presiona con la mano sobre el marco de la [place]. Mantenga los omplatos juntos, llvelos hacia el centro de la espalda. 1. Mantenga esta posicin durante __________ segundos. 2. Afloje lentamente la tensin y relaje los msculos por completo antes de repetir el ejercicio. Repita __________ veces. Realice este ejercicio __________ veces al da. EjercicioC: Rotacin interna, isometra 1. Prese o sintese en la entrada de [place], de frente al marco de la puerta. 2. Flexione el codo izquierdo/derecho y Geneticist, molecular interna de la mueca contra el marco de la [place]. Solo la [employer] debe tocar el [place] parte superior del brazo al costado del cuerpo. 3. Presione suavemente la [employer] contra el marco de la [place], como si tratara de empujar el brazo hacia el abdomen.  Evite encoger el hombro mientras presiona con la mano sobre el marco de la [place]. Mantenga los omplatos juntos, llvelos hacia el centro de la espalda. 1. Mantenga esta posicin durante __________ segundos. 2. Afloje lentamente la tensin y relaje los msculos por completo antes de repetir el ejercicio. Repita __________ veces. Realice este ejercicio __________ veces al da. EjercicioD: Protraccin escapular, decbito supino 1. Acustese boca arriba sobre una superficie firme. Sostenga una pesa de __________ con la mano izquierda/derecha. 2. Eleve el brazo izquierdo/derecho en el aire, de modo que la mano est directamente por encima de la articulacin del hombro. 3. Empuje con la pesa en el aire de forma que el hombro se levante de la superficie sobre la que est recostado. No mueva la cabeza, el cuello ni la espalda. 4. Mantenga esta posicin durante __________ segundos. 5. Vuelva lentamente a la posicin inicial. Relaje totalmente los msculos antes de repetir el ejercicio. Repita __________ veces. Realice este ejercicio __________ veces al da. EjercicioE: Retraccin escapular 1. Sintese en una silla  estable que no tenga apoyabrazos o pngase de pie. 2. Ate una banda para ejercicios a un objeto estable que est frente a usted, de modo que la banda est a la altura del hombro. 3. Sostenga un extremo de la banda para ejercicios en cada mano. Las palmas deben estar Wendi Maya. 4. Junte los omplatos y [place] los codos ligeramente hacia atrs. No encoja los hombros al hacerlo. 5. Mantenga esta posicin durante __________ segundos. 6. Vuelva lentamente a la posicin inicial. Repita __________ veces. Realice este ejercicio __________ veces al da. EjercicioF: Extensin del hombro 1. Sintese en una silla estable que no tenga apoyabrazos o pngase de pie. 2. Ate una banda para ejercicios a un objeto estable que est frente a usted, de modo que la banda est por encima de la altura del hombro. 3. Sostenga un extremo de la banda para ejercicios en cada mano. 4. Extienda los codos y [employer] manos a la altura de los hombros. 5. Junte los omplatos y baje las manos hacia los costados de los muslos. Detngase cuando las manos estn en la misma posicin en ambos costados. No deje que las manos vayan hacia atrs del cuerpo.  6. Mantenga esta posicin durante __________ segundos. 7. Vuelva lentamente a la posicin inicial. Repita __________ veces. Realice este ejercicio __________ veces al da. Esta informacin no tiene Marine scientist el consejo del mdico. Asegrese de hacerle al mdico cualquier pregunta que tenga. Document Released: 04/19/2006 Document Revised: 03/24/2015 Document Reviewed: 06/05/2015 Elsevier Interactive Patient Education  [employer].

## 2017-02-19 ENCOUNTER — Telehealth: Payer: Self-pay | Admitting: Family Medicine

## 2017-02-19 NOTE — Telephone Encounter (Signed)
Jerry Steele with [employer] states she has some questions about the pt HYDROcodone-homatropine (HYCODAN) 5-1.5 MG/5ML syrup [[id number]]  Order Details .  Please adv  Contact number [phone]

## 2017-03-07 ENCOUNTER — Encounter: Payer: Self-pay | Admitting: Family Medicine

## 2017-03-07 ENCOUNTER — Ambulatory Visit (INDEPENDENT_AMBULATORY_CARE_PROVIDER_SITE_OTHER): Payer: Self-pay | Admitting: Family Medicine

## 2017-03-07 VITALS — BP 111/72 | HR 68 | Temp 97.7°F | Resp 18 | Ht 74.0 in | Wt 186.2 lb

## 2017-03-07 DIAGNOSIS — K219 Gastro-esophageal reflux disease without esophagitis: Secondary | ICD-10-CM

## 2017-03-07 DIAGNOSIS — M545 Low back pain, unspecified: Secondary | ICD-10-CM

## 2017-03-07 DIAGNOSIS — Z77120 Contact with and (suspected) exposure to mold (toxic): Secondary | ICD-10-CM

## 2017-03-07 DIAGNOSIS — J988 Other specified respiratory disorders: Secondary | ICD-10-CM

## 2017-03-07 DIAGNOSIS — G8929 Other chronic pain: Secondary | ICD-10-CM

## 2017-03-07 DIAGNOSIS — R519 Headache, unspecified: Secondary | ICD-10-CM

## 2017-03-07 DIAGNOSIS — R51 Headache: Secondary | ICD-10-CM

## 2017-03-07 NOTE — Patient Instructions (Addendum)
Continue taking your cetirizine, flonase, and your pantoprazole.  You can continue taking the ipratropium nasal spray four times a day.   The only treatment for black mold would be to remove the toxin from your environment so if this is what is going on from your blood work, then you may need to seriously consider trying to move or somehow get this eradicated from your house.  IF you received an x-ray today, you will receive an invoice from [facility]. Please contact [facility] at [phone] with questions or concerns regarding your invoice.   IF you received labwork today, you will receive an invoice from [facility]. Please contact [facility] at [phone] with questions or concerns regarding your invoice.   Our billing staff will not be able to assist you with questions regarding bills from these companies.  You will be contacted with the lab results as soon as they are available. The fastest way to get your results is to activate your My Chart account. Instructions are located on the last page of this paperwork. If you have not heard from [place] regarding the results in 2 weeks, please contact this office.     Allergic Rhinitis Allergic rhinitis is when the mucous membranes in the nose respond to allergens. Allergens are particles in the air that cause your body to have an allergic reaction. This causes you to release allergic antibodies. Through a chain of events, these eventually cause you to release histamine into the blood stream. Although meant to protect the body, it is this release of histamine that causes your discomfort, such as frequent sneezing, congestion, and an itchy, runny nose. What are the causes? Seasonal allergic rhinitis (hay fever) is caused by pollen allergens that may come from grasses, trees, and weeds. Year-round allergic rhinitis (perennial allergic rhinitis) is caused by allergens such as house dust mites, pet dander, and mold spores. What are the  signs or symptoms?  Nasal stuffiness (congestion).  Itchy, runny nose with sneezing and tearing of the eyes. How is this diagnosed? Your health care provider can help you determine the allergen or allergens that trigger your symptoms. If you and your health care provider are unable to determine the allergen, skin or blood testing may be used. Your health care provider will diagnose your condition after taking your health history and performing a physical exam. Your health care provider may assess you for other related conditions, such as asthma, pink eye, or an ear infection. How is this treated? Allergic rhinitis does not have a cure, but it can be controlled by:  Medicines that block allergy symptoms. These may include allergy shots, nasal sprays, and oral antihistamines.  Avoiding the allergen.  Hay fever may often be treated with antihistamines in pill or nasal spray forms. Antihistamines block the effects of histamine. There are over-the-counter medicines that may help with nasal congestion and swelling around the eyes. Check with your health care provider before taking or giving this medicine. If avoiding the allergen or the medicine prescribed do not work, there are many new medicines your health care provider can prescribe. Stronger medicine may be used if initial measures are ineffective. Desensitizing injections can be used if medicine and avoidance does not work. Desensitization is when a patient is given ongoing shots until the body becomes less sensitive to the allergen. Make sure you follow up with your health care provider if problems continue. Follow these instructions at home: It is not possible to completely avoid allergens, but you can reduce your  symptoms by taking steps to limit your exposure to them. It helps to know exactly what you are allergic to so that you can avoid your specific triggers. Contact a health care provider if:  You have a fever.  You develop a cough that  does not stop easily (persistent).  You have shortness of breath.  You start wheezing.  Symptoms interfere with normal daily activities. This information is not intended to replace advice given to you by your health care provider. Make sure you discuss any questions you have with your health care provider. Document Released: 03/28/2001 Document Revised: 03/03/2016 Document Reviewed: 03/10/2013 Elsevier Interactive Patient Education  2017 [employer].

## 2017-03-07 NOTE — Progress Notes (Signed)
Subjective:  By signing my name below, I, [name], attest that this documentation has been prepared under the direction and in the presence of Delman Cheadle, MD Electronically Signed: Ladene Artist, ED Scribe 03/07/2017 at 10:43 AM.   Patient ID: Jerry Steele, male    DOB: 29-Sep-1965, 51 y.o.   MRN: [id number]  Chief Complaint  Patient presents with  . Cough    lived in a basement that had black mold in it for years but has never noticed it until recently. Stated that he could see the mold on the ceiling and in the vents    HPI Jerry Steele is a 51 y.o. male who presents to Primary Care at [facility] for possible exposure to mold. Pt states that he has lived in a basement for 4-5 years. States someone replaced the vents today and he noticed black mold. States he Googled symptoms of mold exposure and he has been experiencing symptoms of HA x 3-4 days upon waking, cough, chest congestion, back pain, sinus pressure, post-nasal drip at night. States symptoms improve when he's not at home and he also reports that he was fine until he moved into the basement. He reports extreme temperatures in the basement such as humidity and really cold temperatures. Pt has tried hycodan, Zyrtec and Flonase at night. States his pets are vaccinated but have died for unknown reasons but he suspects they have been exposed to mold as well. Pt has had pets all of his life. No h/o childhood asthma.   Skin Pt has noticed freckles but has recently noticed a new darkened area to his left eye that has not been evaluated.  History reviewed. No pertinent past medical history. Current Outpatient Prescriptions on File Prior to Visit  Medication Sig Dispense Refill  . cetirizine (ZYRTEC) 10 MG tablet Take 1 tablet (10 mg total) by mouth at bedtime. 30 tablet 11  . fluticasone (FLONASE) 50 MCG/ACT nasal spray Place 2 sprays into both nostrils at bedtime. 16 g 2  . HYDROcodone-acetaminophen (NORCO/VICODIN)  5-325 MG tablet Take 1-2 tablets by mouth every 4 (four) hours as needed for moderate pain. 60 tablet 0  . HYDROcodone-homatropine (HYCODAN) 5-1.5 MG/5ML syrup Take 5 mLs by mouth every 4 (four) hours as needed for cough. 180 mL 0  . ipratropium (ATROVENT) 0.03 % nasal spray Place 2 sprays into the nose 4 (four) times daily. 30 mL 1  . pantoprazole (PROTONIX) 40 MG tablet Take 1 tablet (40 mg total) by mouth daily. 30 tablet 3  . sildenafil (REVATIO) 20 MG tablet Use as instructed by physician. 1 to 3 tabs daily as needed 30 tablet 5   No current facility-administered medications on file prior to visit.    No Known Allergies   History reviewed. No pertinent surgical history. History reviewed. No pertinent family history. Social History   Social History  . Marital status: Single    Spouse name: N/A  . Number of children: N/A  . Years of education: N/A   Social History Main Topics  . Smoking status: Current Every Day Smoker  . Smokeless tobacco: Never Used  . Alcohol use No  . Drug use: No  . Sexual activity: Not Asked   Other Topics Concern  . None   Social History Narrative  . None   Depression screen [facility] 2/9 03/07/2017 02/17/2017 05/30/2016 05/27/2016 03/31/2016  Decreased Interest 0 0 0 0 0  Down, Depressed, Hopeless 0 0 0 0 0  PHQ - 2 Score 0 0  0 0 0    Review of Systems  HENT: Positive for congestion, postnasal drip and sinus pressure.   Respiratory: Positive for cough.   Musculoskeletal: Positive for back pain.  Neurological: Positive for headaches.      Objective:   Physical Exam  Constitutional: He is oriented to person, place, and time. He appears well-developed and well-nourished. No distress.  HENT:  Head: Normocephalic and atraumatic.  Right Ear: Tympanic membrane normal.  Left Ear: Tympanic membrane normal.  Nose: Nose normal.  Mouth/Throat: Posterior oropharyngeal erythema present.  post-nasal drip  Eyes: Conjunctivae and EOM are normal.  Neck: Neck  supple. No tracheal deviation present.  Cardiovascular: Normal rate.   Pulmonary/Chest: Effort normal. No respiratory distress.  Musculoskeletal: Normal range of motion.  Neurological: He is alert and oriented to person, place, and time.  Skin: Skin is warm and dry.  Psychiatric: He has a normal mood and affect. His behavior is normal.  Nursing note and vitals reviewed.  BP 111/72   Pulse 68   Temp 97.7 F (36.5 C) (Oral)   Resp 18   Ht 6\' 2"  (1.88 m)   Wt 186 lb 3.2 oz (84.5 kg)   SpO2 99%   BMI 23.91 kg/m     Assessment & Plan:   1. Contact with or exposure to mold   2. Chronic nonintractable headache, unspecified headache type   3. Chronic bilateral low back pain without sciatica   4. Gastroesophageal reflux disease without esophagitis   5. Congestion of respiratory tract     Orders Placed This Encounter  Procedures  . CBC with Differential/Platelet  . Comprehensive metabolic panel  . TSH  . Sedimentation Rate  . Allergen Profile, Mold  . Care order/instruction:    Scheduling Instructions:     [facility] (IF NEB IS ORDERED PLEASE DO BEFORE AND AFTER NEB)   I personally performed the services described in this documentation, which was scribed in my presence. The recorded information has been reviewed and considered, and addended by me as needed.   Delman Cheadle, M.D.  Primary Care at [facility] [address] [place], [place] [address] [phone] phone [phone] fax  03/09/17 11:10 PM

## 2017-03-09 LAB — CBC WITH DIFFERENTIAL/PLATELET
BASOS ABS: 0 10*3/uL (ref 0.0–0.2)
Basos: 0 %
EOS (ABSOLUTE): 0.1 10*3/uL (ref 0.0–0.4)
Eos: 2 %
HEMATOCRIT: 47 % (ref 37.5–51.0)
Hemoglobin: 16.1 g/dL (ref 13.0–17.7)
Immature Grans (Abs): 0 10*3/uL (ref 0.0–0.1)
Immature Granulocytes: 0 %
LYMPHS ABS: 2.1 10*3/uL (ref 0.7–3.1)
Lymphs: 26 %
MCH: 31.1 pg (ref 26.6–33.0)
MCHC: 34.3 g/dL (ref 31.5–35.7)
MCV: 91 fL (ref 79–97)
MONOCYTES: 6 %
Monocytes Absolute: 0.5 10*3/uL (ref 0.1–0.9)
NEUTROS PCT: 66 %
Neutrophils Absolute: 5.4 10*3/uL (ref 1.4–7.0)
PLATELETS: 185 10*3/uL (ref 150–379)
RBC: 5.18 x10E6/uL (ref 4.14–5.80)
RDW: 13.7 % (ref 12.3–15.4)
WBC: 8.1 10*3/uL (ref 3.4–10.8)

## 2017-03-09 LAB — ALLERGEN PROFILE, MOLD
Alternaria Alternata IgE: <0.1 kU/L
Aureobasidi Pullulans IgE: <0.1 kU/L
Candida Albicans IgE: <0.1 kU/L
M009-IgE Fusarium proliferatum: <0.1 kU/L
Phoma Betae IgE: <0.1 kU/L

## 2017-03-09 LAB — COMPREHENSIVE METABOLIC PANEL
ALT: 17 IU/L (ref 0–44)
AST: 18 IU/L (ref 0–40)
Albumin/Globulin Ratio: 1.9 (ref 1.2–2.2)
Albumin: 4.8 g/dL (ref 3.5–5.5)
Alkaline Phosphatase: 80 IU/L (ref 39–117)
BILIRUBIN TOTAL: 0.3 mg/dL (ref 0.0–1.2)
BUN / CREAT RATIO: 19 (ref 9–20)
BUN: 19 mg/dL (ref 6–24)
CALCIUM: 9.5 mg/dL (ref 8.7–10.2)
CHLORIDE: 100 mmol/L (ref 96–106)
CO2: 25 mmol/L (ref 20–29)
Creatinine, Ser: 1.01 mg/dL (ref 0.76–1.27)
GFR, EST AFRICAN AMERICAN: 98 mL/min/{1.73_m2} (ref >59)
GFR, EST NON AFRICAN AMERICAN: 85 mL/min/{1.73_m2} (ref >59)
GLOBULIN, TOTAL: 2.5 g/dL (ref 1.5–4.5)
Glucose: 91 mg/dL (ref 65–99)
POTASSIUM: 4.7 mmol/L (ref 3.5–5.2)
SODIUM: 139 mmol/L (ref 134–144)
TOTAL PROTEIN: 7.3 g/dL (ref 6.0–8.5)

## 2017-03-09 LAB — SEDIMENTATION RATE: Sed Rate: 2 mm/hr (ref 0–30)

## 2017-03-09 LAB — TSH: TSH: 2.41 u[IU]/mL (ref 0.450–4.500)

## 2017-03-20 ENCOUNTER — Telehealth: Payer: Self-pay | Admitting: Family Medicine

## 2017-03-20 DIAGNOSIS — R053 Chronic cough: Secondary | ICD-10-CM

## 2017-03-20 DIAGNOSIS — R05 Cough: Secondary | ICD-10-CM

## 2017-03-20 NOTE — Telephone Encounter (Signed)
Pt wants to know his lab results.  He says his symptoms are continuing to be the same.  [phone]

## 2017-03-21 NOTE — Telephone Encounter (Signed)
Can you please release labs ?

## 2017-03-25 ENCOUNTER — Emergency Department (HOSPITAL_COMMUNITY): Payer: Self-pay

## 2017-03-25 ENCOUNTER — Encounter (HOSPITAL_COMMUNITY): Payer: Self-pay | Admitting: Emergency Medicine

## 2017-03-25 ENCOUNTER — Emergency Department (HOSPITAL_COMMUNITY)
Admission: EM | Admit: 2017-03-25 | Discharge: 2017-03-25 | Disposition: A | Discharge status: Home / Self Care | Payer: Self-pay | Attending: Emergency Medicine | Admitting: Emergency Medicine

## 2017-03-25 DIAGNOSIS — F17210 Nicotine dependence, cigarettes, uncomplicated: Secondary | ICD-10-CM | POA: Insufficient documentation

## 2017-03-25 DIAGNOSIS — R053 Chronic cough: Secondary | ICD-10-CM

## 2017-03-25 DIAGNOSIS — Z79899 Other long term (current) drug therapy: Secondary | ICD-10-CM | POA: Insufficient documentation

## 2017-03-25 DIAGNOSIS — R05 Cough: Secondary | ICD-10-CM | POA: Insufficient documentation

## 2017-03-25 DIAGNOSIS — R0602 Shortness of breath: Secondary | ICD-10-CM | POA: Insufficient documentation

## 2017-03-25 HISTORY — DX: Pneumonia, unspecified organism: J18.9

## 2017-03-25 MED ORDER — BENZONATATE 100 MG PO CAPS
100.0000 mg | ORAL_CAPSULE | Freq: Once | ORAL | Status: AC
  Administered 2017-03-25: 100 mg via ORAL
  Filled 2017-03-25: qty 1

## 2017-03-25 MED ORDER — ALBUTEROL SULFATE HFA 108 (90 BASE) MCG/ACT IN AERS: 1.0000 | INHALATION_SPRAY | Freq: Four times a day (QID) | RESPIRATORY_TRACT | 0 refills | Status: DC | PRN

## 2017-03-25 MED ORDER — DEXAMETHASONE SODIUM PHOSPHATE 10 MG/ML IJ SOLN
10.0000 mg | Freq: Once | INTRAMUSCULAR | Status: AC
  Administered 2017-03-25: 10 mg via INTRAMUSCULAR
  Filled 2017-03-25: qty 1

## 2017-03-25 MED ORDER — ALBUTEROL SULFATE HFA 108 (90 BASE) MCG/ACT IN AERS
1.0000 | INHALATION_SPRAY | Freq: Once | RESPIRATORY_TRACT | Status: AC
  Administered 2017-03-25: 1 via RESPIRATORY_TRACT
  Filled 2017-03-25: qty 6.7

## 2017-03-25 MED ORDER — BENZONATATE 100 MG PO CAPS: 100.0000 mg | ORAL_CAPSULE | Freq: Three times a day (TID) | ORAL | 0 refills | Status: DC

## 2017-03-25 NOTE — Discharge Instructions (Signed)
Please read and follow all provided instructions.  Your diagnoses today include:  1. Chronic cough     Tests performed today include: Vital signs. See below for your results today.   Medications prescribed:  Take as prescribed   Home care instructions:  Follow any educational materials contained in this packet.  Follow-up instructions: Please follow-up with your primary care provider for further evaluation of symptoms and treatment   Return instructions:  Please return to the Emergency Department if you do not get better, if you get worse, or new symptoms OR  - Fever (temperature greater than 101.66F)  - Bleeding that does not stop with holding pressure to the area    -Severe pain (please note that you may be more sore the day after your accident)  - Chest Pain  - Difficulty breathing  - Severe nausea or vomiting  - Inability to tolerate food and liquids  - Passing out  - Skin becoming red around your wounds  - Change in mental status (confusion or lethargy)  - New numbness or weakness    Please return if you have any other emergent concerns.  Additional Information:  Your vital signs today were: BP (!) 146/111 (BP Location: Left Arm)    Pulse 65    Temp 98.7 F (37.1 C) (Oral)    Resp 18    SpO2 99%  If your blood pressure (BP) was elevated above 135/85 this visit, please have this repeated by your doctor within one month. ---------------

## 2017-03-25 NOTE — ED Provider Notes (Signed)
[facility] DEPT Provider Note   CSN: [id number] Arrival date & time: 03/25/17  1921     History   Chief Complaint Chief Complaint  Patient presents with  . Cough    HPI Jerry Steele is a 51 y.o. male.  HPI  51 y.o. male, presents to the Emergency Department today due to cough x 3 months. Notes having pneumonia in past, but states that the cough never cleared. Pt concerned due to being shortness of breath at night intermittently. No shortness of breath at this time. Denies chest pain. Notes cough is intermittent. No N/V. No diarrhea. No sinus congestion. No fevers. No rhinorrhea. Has not tried OTC remedies. No other symptoms noted.     Past Medical History:  Diagnosis Date  . Pneumonia     There are no active problems to display for this patient.   History reviewed. No pertinent surgical history.     Home Medications    Prior to Admission medications   Medication Sig Start Date End Date Taking? Authorizing Provider  cetirizine (ZYRTEC) 10 MG tablet Take 1 tablet (10 mg total) by mouth at bedtime. 02/17/17   Shawnee Knapp, MD  fluticasone (FLONASE) 50 MCG/ACT nasal spray Place 2 sprays into both nostrils at bedtime. 02/17/17   Shawnee Knapp, MD  HYDROcodone-acetaminophen (NORCO/VICODIN) 5-325 MG tablet Take 1-2 tablets by mouth every 4 (four) hours as needed for moderate pain. 02/17/17   Shawnee Knapp, MD  ipratropium (ATROVENT) 0.03 % nasal spray Place 2 sprays into the nose 4 (four) times daily. 05/27/16   Shawnee Knapp, MD  pantoprazole (PROTONIX) 40 MG tablet Take 1 tablet (40 mg total) by mouth daily. 12/07/16   Ivar Drape D, PA  sildenafil (REVATIO) 20 MG tablet Use as instructed by physician. 1 to 3 tabs daily as needed 12/07/16   Ivar Drape D, PA    Family History No family history on file.  Social History Social History  Substance Use Topics  . Smoking status: Current Every Day Smoker  . Smokeless tobacco: Never Used  . Alcohol use No      Allergies   Patient has no known allergies.   Review of Systems Review of Systems ROS reviewed and all are negative for acute change except as noted in the HPI.  Physical Exam Updated Vital Signs BP (!) 146/111 (BP Location: Left Arm)   Pulse 65   Temp 98.7 F (37.1 C) (Oral)   Resp 18   SpO2 99%   Physical Exam  Constitutional: He is oriented to person, place, and time. Vital signs are normal. He appears well-developed and well-nourished. No distress.  HENT:  Head: Normocephalic and atraumatic.  Right Ear: Hearing, tympanic membrane, external ear and ear canal normal.  Left Ear: Hearing, tympanic membrane, external ear and ear canal normal.  Nose: Nose normal.  Mouth/Throat: Uvula is midline, oropharynx is clear and moist and mucous membranes are normal. No trismus in the jaw. No oropharyngeal exudate, posterior oropharyngeal erythema or tonsillar abscesses.  Eyes: Pupils are equal, round, and reactive to light. Conjunctivae and EOM are normal.  Neck: Normal range of motion. Neck supple. No tracheal deviation present.  Cardiovascular: Normal rate, regular rhythm, S1 normal, S2 normal, normal heart sounds, intact distal pulses and normal pulses.   Pulmonary/Chest: Effort normal and breath sounds normal. No respiratory distress. He has no decreased breath sounds. He has no wheezes. He has no rhonchi. He has no rales.  Abdominal: Normal appearance and bowel sounds  are normal. There is no tenderness.  Musculoskeletal: Normal range of motion.  Neurological: He is alert and oriented to person, place, and time.  Skin: Skin is warm and dry.  Psychiatric: He has a normal mood and affect. His speech is normal and behavior is normal. Thought content normal.  Nursing note and vitals reviewed.    ED Treatments / Results  Labs (all labs ordered are listed, but only abnormal results are displayed) Labs Reviewed - No data to display  EKG  EKG Interpretation None        Radiology Dg Chest 2 View  Result Date: 03/25/2017 CLINICAL DATA:  Cough for 3 months.  History of pneumonia. EXAM: CHEST  2 VIEW COMPARISON:  Chest from acute abdomen series 10/08/2015 FINDINGS: The cardiomediastinal contours are normal. Central bronchial thickening, new. Pulmonary vasculature is normal. No consolidation, pleural effusion, or pneumothorax. No acute osseous abnormalities are seen. IMPRESSION: Central bronchial thickening suspicious for bronchitis. No consolidation. Electronically Signed   By: Jeb Levering M.D.   On: 03/25/2017 20:06    Procedures Procedures (including critical care time)  Medications Ordered in ED Medications - No data to display   Initial Impression / Assessment and Plan / ED Course  I have reviewed the triage vital signs and the nursing notes.  Pertinent labs & imaging results that were available during my care of the patient were reviewed by me and considered in my medical decision making (see chart for details).  Final Clinical Impressions(s) / ED Diagnoses   {I have reviewed and evaluated the relevant imaging studies.  {I have reviewed the relevant previous healthcare records.  {I obtained HPI from historian.   ED Course:  Assessment: Pt is a 51 y.o. male  presents to the Emergency Department today due to cough x 3 months. Notes having pneumonia in past, but states that the cough never cleared. Pt concerned due to being shortness of breath at night intermittently. No shortness of breath at this time. Denies chest pain. Notes cough is intermittent. No N/V. No diarrhea. No sinus congestion. No fevers. No rhinorrhea. Has not tried OTC remedies. On exam, pt in NAD. VSS. Afebrile. Lungs CTA, Heart RRR. Abdomen nontender/soft. Pt CXR negative for acute infiltrate. Showed bronchitis. Pt still smokes. Given decadron and inhaler. Counseled on smoking cessation. Patients symptoms are consistent with URI, likely viral etiology. Discussed that antibiotics  are not indicated for viral infections. Pt will be discharged with symptomatic treatment.  Verbalizes understanding and is agreeable with plan. Pt is hemodynamically stable & in NAD prior to dc  Disposition/Plan:  DC Home Additional Verbal discharge instructions given and discussed with patient.  Pt Instructed to f/u with PCP in the next week for evaluation and treatment of symptoms. Return precautions given Pt acknowledges and agrees with plan  Supervising Physician Carmin Muskrat, MD  Final diagnoses:  Chronic cough    New Prescriptions New Prescriptions   No medications on file     Conni Slipper 03/25/17 2257    Carmin Muskrat, MD 03/25/17 [phone]

## 2017-03-25 NOTE — ED Notes (Signed)
Bed: WA08 Expected date:  Expected time:  Means of arrival:  Comments: 

## 2017-03-25 NOTE — ED Triage Notes (Signed)
Pt reports having a dry cough for the past 3 months.  Reports having pneumonia then but states it cleared up but cough remained.  Concerned because occasionally at night when he starts coughing he becomes SOB.  Denies SOB at this time.  Also reports right shoulder pain that he reports as arthritis. Ambulatory.  In no acute distress at this time.

## 2017-04-03 NOTE — Telephone Encounter (Signed)
Referral placed.

## 2017-04-03 NOTE — Telephone Encounter (Signed)
Labs are all normal. No sign of him reacting strongly to allergens like mold. I would be happy to refer him to a lung doctor or an allergy doctor for further evaluation if he would like.

## 2017-04-03 NOTE — Telephone Encounter (Signed)
Pt is still having cough and would like a referral for the lung doctor.

## 2017-04-09 ENCOUNTER — Encounter: Payer: Self-pay | Admitting: Family Medicine

## 2017-04-09 ENCOUNTER — Ambulatory Visit (INDEPENDENT_AMBULATORY_CARE_PROVIDER_SITE_OTHER): Payer: Self-pay | Admitting: Family Medicine

## 2017-04-09 VITALS — BP 133/88 | HR 60 | Temp 98.0°F | Resp 18 | Ht 74.0 in | Wt 189.6 lb

## 2017-04-09 DIAGNOSIS — R058 Other specified cough: Secondary | ICD-10-CM

## 2017-04-09 DIAGNOSIS — R05 Cough: Secondary | ICD-10-CM

## 2017-04-09 DIAGNOSIS — N529 Male erectile dysfunction, unspecified: Secondary | ICD-10-CM

## 2017-04-09 DIAGNOSIS — R053 Chronic cough: Secondary | ICD-10-CM

## 2017-04-09 MED ORDER — PANTOPRAZOLE SODIUM 40 MG PO TBEC: 40.0000 mg | DELAYED_RELEASE_TABLET | Freq: Every day | ORAL | 3 refills | Status: DC

## 2017-04-09 MED ORDER — SILDENAFIL CITRATE 20 MG PO TABS: ORAL_TABLET | ORAL | 5 refills | Status: DC

## 2017-04-09 MED ORDER — BENZONATATE 200 MG PO CAPS: 200.0000 mg | ORAL_CAPSULE | Freq: Three times a day (TID) | ORAL | 1 refills | Status: DC | PRN

## 2017-04-09 MED ORDER — PROMETHAZINE-CODEINE 6.25-10 MG/5ML PO SYRP: 10.0000 mL | ORAL_SOLUTION | ORAL | 0 refills | Status: DC | PRN

## 2017-04-09 NOTE — Patient Instructions (Addendum)
IF you received an x-ray today, you will receive an invoice from [facility]. Please contact [facility] at [phone] with questions or concerns regarding your invoice.   IF you received labwork today, you will receive an invoice from [facility]. Please contact [facility] at [phone] with questions or concerns regarding your invoice.   Our billing staff will not be able to assist you with questions regarding bills from these companies.  You will be contacted with the lab results as soon as they are available. The fastest way to get your results is to activate your My Chart account. Instructions are located on the last page of this paperwork. If you have not heard from [place] regarding the results in 2 weeks, please contact this office.     The most common causes of chronic cough in immunocompetent adults include the following: upper airway cough syndrome (UACS), previously referred to as postnasal drip syndrome (PNDS), which is caused by variety of rhinosinus conditions.  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  Try at night allergy medications of fluticasone nasal spray and cetirizine since this is when cough is the worst.  Suppress your cough and throat clearing during the daytime with candy and delsym and tessalon pearles, not throat lozenges.  You may use the cough syrup before bed.  Take the protonix and benzonatate first thing in the morning. Take the benzonatate again around dinner. Take the cetirizine, fluticasone nasal spray, and cough syrup before bed at night.   It is fine not to use the albuterol if it is not helping. When you go to the pulmonary doctor on 10/24, bring a big bag of EVERYTHING you have (or have had) that you have tried for the cough so they can see (like the inhaler)l    Cough, Adult A cough helps to clear  your throat and lungs. A cough may last only 2-3 weeks (acute), or it may last longer than 8 weeks (chronic). Many different things can cause a cough. A cough may be a sign of an illness or another medical condition. Follow these instructions at home:  Pay attention to any changes in your cough.  Take medicines only as told by your doctor. ? If you were prescribed an antibiotic medicine, take it as told by your doctor. Do not stop taking it even if you start to feel better. ? Talk with your doctor before you try using a cough medicine.  Drink enough fluid to keep your pee (urine) clear or pale yellow.  If the air is dry, use a cold steam vaporizer or humidifier in your home.  Stay away from things that make you cough at work or at home.  If your cough is worse at night, try using extra pillows to raise your head up higher while you sleep.  Do not smoke, and try not to be around smoke. If you need help quitting, ask your doctor.  Do not have caffeine.  Do not drink alcohol.  Rest as needed. Contact a doctor if:  You have new problems (symptoms).  You cough up yellow fluid (pus).  Your cough does not get better after 2-3 weeks, or your cough gets worse.  Medicine does not help your cough and you are not sleeping well.  You have pain that gets worse or pain that is not helped with medicine.  You have a  fever.  You are losing weight and you do not know why.  You have night sweats. Get help right away if:  You cough up blood.  You have trouble breathing.  Your heartbeat is very fast. This information is not intended to replace advice given to you by your health care provider. Make sure you discuss any questions you have with your health care provider. Document Released: 03/16/2011 Document Revised: 12/09/2015 Document Reviewed: 09/09/2014 Elsevier Interactive Patient Education  [employer].

## 2017-04-09 NOTE — Progress Notes (Signed)
   Subjective:    Patient ID: Jerry Steele, male    DOB: 04-May-1966, 51 y.o.   MRN: [id number] Chief Complaint  Patient presents with  . Contact with or exposure to mold  . Follow-up    HPI Starts have dry cough and it is productive - he doesn't know what color it is.  Coughs whenever he is talkingh  Has appt with pulm on 10/25.  In his throat, he feels it is irritated He is using the alb and benzonate didn't help at all.  Would like ot try an antiobiotic. Definitely worse in mroning or at night as will cough for 10 min.  No f/c.  Taking ppi, denies water brash, denies gerd/gastritis - protonix working. Review of Systems     Objective:   Physical Exam  Constitutional: He is oriented to person, place, and time. He appears well-developed and well-nourished. No distress.  HENT:  Head: Normocephalic and atraumatic.  Right Ear: Tympanic membrane, external ear and ear canal normal.  Left Ear: Tympanic membrane, external ear and ear canal normal.  Nose: Nose normal.  Mouth/Throat: Oropharynx is clear and moist and mucous membranes are normal. No oropharyngeal exudate.  Eyes: Conjunctivae are normal. No scleral icterus.  Neck: Normal range of motion. Neck supple. No thyromegaly present.  Cardiovascular: Normal rate, regular rhythm, normal heart sounds and intact distal pulses.   Pulmonary/Chest: Effort normal and breath sounds normal. No respiratory distress.  Musculoskeletal: He exhibits no edema.  Lymphadenopathy:    He has no cervical adenopathy.  Neurological: He is alert and oriented to person, place, and time.  Skin: Skin is warm and dry. He is not diaphoretic. No erythema.  Psychiatric: He has a normal mood and affect. His behavior is normal.      BP 133/88   Pulse 60   Temp 98 F (36.7 C) (Oral)   Resp 18   Ht 6\' 2"  (1.88 m)   Wt 189 lb 9.6 oz (86 kg)   SpO2 97%   BMI 24.34 kg/m   Assessment & Plan:   1. Chronic cough   2. Upper airway cough  syndrome   3. Erectile dysfunction, unspecified erectile dysfunction type      Meds ordered this encounter  Medications  . benzonatate (TESSALON) 200 MG capsule    Sig: Take 1 capsule (200 mg total) by mouth 3 (three) times daily as needed for cough.    Dispense:  60 capsule    Refill:  1  . pantoprazole (PROTONIX) 40 MG tablet    Sig: Take 1 tablet (40 mg total) by mouth daily.    Dispense:  30 tablet    Refill:  3  . sildenafil (REVATIO) 20 MG tablet    Sig: Use as instructed by physician. 1 to 3 tabs daily as needed    Dispense:  60 tablet    Refill:  5  . promethazine-codeine (PHENERGAN WITH CODEINE) 6.25-10 MG/5ML syrup    Sig: Take 10 mLs by mouth every 4 (four) hours as needed for cough.    Dispense:  300 mL    Refill:  0    Delman Cheadle, M.D.  Primary Care at [facility] [address] [place], [place] [address] [phone] phone [phone] fax  04/11/17 11:02 PM

## 2017-04-16 ENCOUNTER — Telehealth: Payer: Self-pay | Admitting: Family Medicine

## 2017-04-16 NOTE — Telephone Encounter (Signed)
Pt is calling stating that he has been up coughing all night long.  He wants to have an appointment with Brigitte Pulse today but she is booked.  I offered another open appointment with a different provider but he refused.  Please advise [phone]

## 2017-04-20 MED ORDER — AZITHROMYCIN 250 MG PO TABS: ORAL_TABLET | ORAL | 0 refills | Status: DC

## 2017-04-20 NOTE — Telephone Encounter (Signed)
Sent antibiotic for bronchitis - azithromycin or a "zpack" to his [facility] pharamcy if he is still coughing.  It keeps working for a full week AFTER it is finished and cough might persist for another week after that but should be getting better and better. If not, come back in or wait for pulmonlogy visit.

## 2017-04-20 NOTE — Telephone Encounter (Signed)
Pt advised.

## 2017-04-20 NOTE — Telephone Encounter (Signed)
Attempted to leave voicemail

## 2017-05-01 ENCOUNTER — Ambulatory Visit (INDEPENDENT_AMBULATORY_CARE_PROVIDER_SITE_OTHER): Payer: Self-pay | Admitting: Pulmonary Disease

## 2017-05-01 ENCOUNTER — Encounter: Payer: Self-pay | Admitting: Pulmonary Disease

## 2017-05-01 VITALS — BP 114/68 | HR 68 | Ht 74.0 in | Wt 188.0 lb

## 2017-05-01 DIAGNOSIS — R0600 Dyspnea, unspecified: Secondary | ICD-10-CM

## 2017-05-01 DIAGNOSIS — J189 Pneumonia, unspecified organism: Secondary | ICD-10-CM

## 2017-05-01 DIAGNOSIS — K219 Gastro-esophageal reflux disease without esophagitis: Secondary | ICD-10-CM

## 2017-05-01 DIAGNOSIS — R05 Cough: Secondary | ICD-10-CM

## 2017-05-01 DIAGNOSIS — R059 Cough, unspecified: Secondary | ICD-10-CM

## 2017-05-01 NOTE — Progress Notes (Signed)
Subjective:    Patient ID: Jerry Steele, male    DOB: [birth year], 51 y.o.   MRN: [id number]  Synopsis: referred in 2018 for evaluation of cough.  HPI Chief Complaint  Patient presents with  . Advice Only    Referred by Dr. Brigitte Pulse for cough X4 mos.     Jerry Steele is here to see me for cough.  He says that for most of his life he has been very healthy and has never had any significant problems.  However for the last 4 months he has been living in a basement and he has been coughing a lot. > he has been producing phlegm and he feels congestion in his chest > he feels a little trouble breathing  > he feels some wheezing in his chest > the mucus is clear, never any color to it > the cough his worse when he is flat > eating has no effect on the meal   He says that his mother reported an episode of bronchitis as a kid.    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  Some sinus congestion.  Not too much runny nose.  He has a scratchy throat.  He has some soreness in his chest.    He feels a scratchiness in his throat which bothers him a lot.  Hard candies help.    He has been seen by his PCP and was given an inhaler because of chest tightness and wheezing.  He was also given a cough medicine and antibiotic.    He notes that he has smoked about 1 pack per week for decades, mostly when he goes out to a party.  He has stopped smoking 4-5 weeks ago.    He reports having an episode of pneumonia earlier this year and "fluid on his lungs". Unfortunately I cannot review these chest x-ray images.  Past Medical History:  Diagnosis Date  . Pneumonia      No family history on file.   Social History   Social History  . Marital status: Single    Spouse name: N/A  . Number of children: N/A  . Years of education: N/A   Occupational History  . Not on file.   Social History Main Topics  .  Smoking status: Former Smoker    Packs/day: 0.25    Years: 30.00    Types: Cigarettes    Quit date: 04/17/2017  . Smokeless tobacco: Never Used  . Alcohol use No  . Drug use: No  . Sexual activity: Not on file   Other Topics Concern  . Not on file   Social History Narrative  . No narrative on file     No Known Allergies   Outpatient Medications Prior to Visit  Medication Sig Dispense Refill  . albuterol (PROVENTIL HFA;VENTOLIN HFA) 108 (90 Base) MCG/ACT inhaler Inhale 1-2 puffs into the lungs every 6 (six) hours as needed for wheezing or shortness of breath. 1 Inhaler 0  . azithromycin (ZITHROMAX) 250 MG tablet Take 2 tabs PO x 1 dose, then 1 tab PO QD x 4 days 6 tablet 0  . benzonatate (TESSALON) 200 MG capsule Take 1 capsule (200 mg total) by mouth 3 (three) times daily as needed for cough. 60 capsule 1  . cetirizine (ZYRTEC) 10 MG tablet Take 1 tablet (10 mg total) by mouth at bedtime. 30 tablet 11  .  fluticasone (FLONASE) 50 MCG/ACT nasal spray Place 2 sprays into both nostrils at bedtime. 16 g 2  . pantoprazole (PROTONIX) 40 MG tablet Take 1 tablet (40 mg total) by mouth daily. 30 tablet 3  . promethazine-codeine (PHENERGAN WITH CODEINE) 6.25-10 MG/5ML syrup Take 10 mLs by mouth every 4 (four) hours as needed for cough. 300 mL 0  . sildenafil (REVATIO) 20 MG tablet Use as instructed by physician. 1 to 3 tabs daily as needed 60 tablet 5   No facility-administered medications prior to visit.       Review of Systems  Constitutional: Negative for fever and unexpected weight change.  HENT: Positive for congestion. Negative for dental problem, ear pain, nosebleeds, postnasal drip, rhinorrhea, sinus pressure, sneezing, sore throat and trouble swallowing.   Eyes: Negative for redness and itching.  Respiratory: Positive for cough and shortness of breath. Negative for chest tightness and wheezing.   Cardiovascular: Negative for palpitations and leg swelling.  Gastrointestinal:  Negative for nausea and vomiting.  Genitourinary: Negative for dysuria.  Musculoskeletal: Negative for joint swelling.  Skin: Negative for rash.  Neurological: Negative for headaches.  Hematological: Does not bruise/bleed easily.  Psychiatric/Behavioral: Negative for dysphoric mood. The patient is not nervous/anxious.        Objective:   Physical Exam Vitals:   05/01/17 0901  BP: 114/68  Pulse: 68  SpO2: 97%  Weight: 188 lb (85.3 kg)  Height: 6\' 2"  (1.88 m)   Gen: well appearing, no acute distress HENT: NCAT, OP clear, neck supple without masses Eyes: PERRL, EOMi Lymph: no cervical lymphadenopathy PULM: CTA B CV: RRR, no mgr, no JVD GI: BS+, soft, nontender, no hsm Derm: no rash or skin breakdown MSK: normal bulk and tone Neuro: A&Ox4, CN II-XII intact, strength 5/5 in all 4 extremities Psyche: normal mood and affect   Labs: Candida, Setomelanoma, Aureodasaid, Phoma IgE normal  Chest imaging: 03/2017 CXR reviewed, some bronchitis changes but otherwise normal, personally reviewed  Spirometry: October 2018: Ratio 77%, FVC decreased, 53% predicted     Assessment & Plan:   Cough - Plan: Nitric oxide, Spirometry with Graph  Dyspnea, unspecified type - Plan: Nitric oxide, Spirometry with Graph  Gastroesophageal reflux disease, esophagitis presence not specified  Community acquired pneumonia, unspecified laterality  Discussion: E presents with cough in the setting of tobacco use for several decades and a recent exposure to mold in the basement apartment he lives in. Given his intermittent chest tightness and wheezing its very reasonable to consider asthma.  We attempted exhaled nitric oxide testing but the patient was unable to perform this today.  Spirometry performed today in the office showed no airflow obstruction but was suggestive of restriction.  He would like to get a CT scan of his chest because of his recent episodes of pneumonia he wants to make sure there  is "nothing else going on". I think this is reasonable considering the central bronchitis and airway thickening seen on his chest x-ray.  In general I think with most likely going on is upper airway cough from gastroesophageal reflux disease but it's very reasonable to rule out asthma.  He is non-compliant with his pantoprazole.  Plan: For your history of pneumonia with continued cough: We will check a CT scan of your chest to make sure there is nothing else going on Full PFT Continue taking the tessalon perles  For tobacco use: Stop smoking.  For gastroesophageal reflux disease: This is making your cough worse Take pantoprazole every day, not just  as needed Follow the gastroesophageal lifestyle modification sheet we gave you  We will see you back in 3-4 weeks with the nurse practitioner to make sure you are getting better and to go over the CT scan.   Current Outpatient Prescriptions:  .  albuterol (PROVENTIL HFA;VENTOLIN HFA) 108 (90 Base) MCG/ACT inhaler, Inhale 1-2 puffs into the lungs every 6 (six) hours as needed for wheezing or shortness of breath., Disp: 1 Inhaler, Rfl: 0 .  azithromycin (ZITHROMAX) 250 MG tablet, Take 2 tabs PO x 1 dose, then 1 tab PO QD x 4 days, Disp: 6 tablet, Rfl: 0 .  benzonatate (TESSALON) 200 MG capsule, Take 1 capsule (200 mg total) by mouth 3 (three) times daily as needed for cough., Disp: 60 capsule, Rfl: 1 .  cetirizine (ZYRTEC) 10 MG tablet, Take 1 tablet (10 mg total) by mouth at bedtime., Disp: 30 tablet, Rfl: 11 .  fluticasone (FLONASE) 50 MCG/ACT nasal spray, Place 2 sprays into both nostrils at bedtime., Disp: 16 g, Rfl: 2 .  pantoprazole (PROTONIX) 40 MG tablet, Take 1 tablet (40 mg total) by mouth daily., Disp: 30 tablet, Rfl: 3 .  promethazine-codeine (PHENERGAN WITH CODEINE) 6.25-10 MG/5ML syrup, Take 10 mLs by mouth every 4 (four) hours as needed for cough., Disp: 300 mL, Rfl: 0 .  sildenafil (REVATIO) 20 MG tablet, Use as instructed by  physician. 1 to 3 tabs daily as needed, Disp: 60 tablet, Rfl: 5

## 2017-05-01 NOTE — Patient Instructions (Addendum)
For your history of pneumonia with continued cough: Today's tests didn't show asthma but we need to do more testing We will check a CT scan of your chest to make sure there is nothing else going on We will check a pulmonary function test Continue taking the tessalon perles  For tobacco use: Stop smoking.  For gastroesophageal reflux disease: This is making your cough worse Take pantoprazole every day, not just as needed Follow the gastroesophageal lifestyle modification sheet we gave you  We will see you back in 3-4 weeks with the nurse practitioner to make sure you are getting better and to go over the CT scan.

## 2017-05-07 ENCOUNTER — Ambulatory Visit (INDEPENDENT_AMBULATORY_CARE_PROVIDER_SITE_OTHER)
Admission: RE | Admit: 2017-05-07 | Discharge: 2017-05-07 | Disposition: A | Discharge status: Home / Self Care | Payer: Self-pay | Source: Ambulatory Visit | Attending: Pulmonary Disease | Admitting: Pulmonary Disease

## 2017-05-07 DIAGNOSIS — R059 Cough, unspecified: Secondary | ICD-10-CM

## 2017-05-07 DIAGNOSIS — R0600 Dyspnea, unspecified: Secondary | ICD-10-CM

## 2017-05-07 DIAGNOSIS — J189 Pneumonia, unspecified organism: Secondary | ICD-10-CM

## 2017-05-07 DIAGNOSIS — R05 Cough: Secondary | ICD-10-CM

## 2017-05-09 ENCOUNTER — Telehealth: Payer: Self-pay | Admitting: Pulmonary Disease

## 2017-05-09 NOTE — Telephone Encounter (Signed)
Is he out of tessalon? If so then we can refill. Otherwise then he can take delsym until next visit.

## 2017-05-09 NOTE — Telephone Encounter (Signed)
Left message for patient to call back  

## 2017-05-09 NOTE — Telephone Encounter (Signed)
ATC pt x2 - line rings, sounds like someone picks up the phone but there is no noise/answer.  WCB.

## 2017-05-09 NOTE — Telephone Encounter (Signed)
Pt returned call He stated that he was given a zpak by his PCP on 10.5.18 and finished this after 5 days Pt is still coughing, but reports the mucus is still clear.   Dyspnea and wheezing are unchanged  Pt feels like he needs additional abx - explained to patient that discolored mucus is a sign of infection that would warrant an abx.  He voiced his understanding.  He has perhaps 10-15 Tessalon capsules left, would like a refill (Tessalon was written by PCP on 9.28.18 #60 with 1 refill - called [facility] spoke with Caryl Pina, pt has 1 refill left.  She will get this ready for pt).  Pt also stated that BQ mentioned adding a "stronger inhaler" to his regimen and would like to begin this type of therapy.   Dr. Lake Bells please advise, thank you.

## 2017-05-09 NOTE — Telephone Encounter (Signed)
Trial Breo 100, offer sample

## 2017-05-09 NOTE — Telephone Encounter (Signed)
Notes recorded by Juanito Doom, MD on 05/08/2017 at 4:35 PM EDT A, Please let the patient know this was OK Thanks, B  Advised pt of results. Pt states he is still coughing and wants to know if BQ could prescribe something for his cough. Please advise.

## 2017-05-10 ENCOUNTER — Telehealth: Payer: Self-pay | Admitting: Pulmonary Disease

## 2017-05-10 ENCOUNTER — Institutional Professional Consult (permissible substitution): Payer: Self-pay | Admitting: Emergency Medicine

## 2017-05-10 MED ORDER — FLUTICASONE FUROATE-VILANTEROL 100-25 MCG/INH IN AEPB: 1.0000 | INHALATION_SPRAY | Freq: Every day | RESPIRATORY_TRACT | 0 refills | Status: AC

## 2017-05-10 NOTE — Telephone Encounter (Signed)
Pt aware that sample of Breo 100 has been placed up front for pick up.  Nothing further needed.

## 2017-05-10 NOTE — Telephone Encounter (Signed)
Pt has been instructed on use of Breo. Nothing further needed.

## 2017-05-14 ENCOUNTER — Ambulatory Visit (INDEPENDENT_AMBULATORY_CARE_PROVIDER_SITE_OTHER): Payer: Self-pay | Admitting: Family Medicine

## 2017-05-14 ENCOUNTER — Encounter: Payer: Self-pay | Admitting: Family Medicine

## 2017-05-14 VITALS — BP 124/78 | HR 78 | Temp 98.1°F | Resp 17 | Ht 74.0 in | Wt 187.2 lb

## 2017-05-14 DIAGNOSIS — R05 Cough: Secondary | ICD-10-CM

## 2017-05-14 DIAGNOSIS — R053 Chronic cough: Secondary | ICD-10-CM

## 2017-05-14 DIAGNOSIS — F17200 Nicotine dependence, unspecified, uncomplicated: Secondary | ICD-10-CM

## 2017-05-14 MED ORDER — FLUTICASONE PROPIONATE 50 MCG/ACT NA SUSP: 2.0000 | Freq: Every day | NASAL | 2 refills | Status: DC

## 2017-05-14 NOTE — Patient Instructions (Addendum)
Every day for six weeks at least Every morning take pantoprazole, inhale albuterol and flonase  Every night and take zyrtec and albuterol      IF you received an x-ray today, you will receive an invoice from [facility]. Please contact [facility] at [phone] with questions or concerns regarding your invoice.   IF you received labwork today, you will receive an invoice from [facility]. Please contact [facility] at [phone] with questions or concerns regarding your invoice.   Our billing staff will not be able to assist you with questions regarding bills from these companies.  You will be contacted with the lab results as soon as they are available. The fastest way to get your results is to activate your My Chart account. Instructions are located on the last page of this paperwork. If you have not heard from [place] regarding the results in 2 weeks, please contact this office.
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
## 2017-05-16 DIAGNOSIS — F17200 Nicotine dependence, unspecified, uncomplicated: Secondary | ICD-10-CM | POA: Insufficient documentation

## 2017-05-23 ENCOUNTER — Ambulatory Visit (INDEPENDENT_AMBULATORY_CARE_PROVIDER_SITE_OTHER): Payer: Self-pay | Admitting: Pulmonary Disease

## 2017-05-23 ENCOUNTER — Encounter: Payer: Self-pay | Admitting: Pulmonary Disease

## 2017-05-23 VITALS — BP 116/70 | HR 82 | Ht 74.0 in | Wt 185.0 lb

## 2017-05-23 DIAGNOSIS — R05 Cough: Secondary | ICD-10-CM

## 2017-05-23 DIAGNOSIS — R059 Cough, unspecified: Secondary | ICD-10-CM

## 2017-05-23 DIAGNOSIS — J209 Acute bronchitis, unspecified: Secondary | ICD-10-CM

## 2017-05-23 DIAGNOSIS — R0600 Dyspnea, unspecified: Secondary | ICD-10-CM

## 2017-05-23 DIAGNOSIS — K219 Gastro-esophageal reflux disease without esophagitis: Secondary | ICD-10-CM

## 2017-05-23 LAB — PULMONARY FUNCTION TEST
DL/VA % PRED: 92 %
DL/VA: 4.49 ml/min/mmHg/L
DLCO cor % pred: 64 %
DLCO cor: 24.47 ml/min/mmHg
DLCO unc % pred: 67 %
DLCO unc: 25.32 ml/min/mmHg
FEF 25-75 Post: 2.86 L/sec
FEF 25-75 Pre: 1.94 L/sec
FEF2575-%CHANGE-POST: 47 %
FEF2575-%PRED-POST: 77 %
FEF2575-%Pred-Pre: 52 %
FEV1-%CHANGE-POST: 11 %
FEV1-%Pred-Post: 66 %
FEV1-%Pred-Pre: 59 %
FEV1-Post: 2.9 L
FEV1-Pre: 2.6 L
FEV1FVC-%CHANGE-POST: 2 %
FEV1FVC-%Pred-Pre: 96 %
FEV6-%Change-Post: 8 %
FEV6-%PRED-POST: 68 %
FEV6-%PRED-PRE: 63 %
FEV6-PRE: 3.46 L
FEV6-Post: 3.75 L
FEV6FVC-%CHANGE-POST: 0 %
FEV6FVC-%PRED-PRE: 102 %
FEV6FVC-%Pred-Post: 103 %
FVC-%CHANGE-POST: 8 %
FVC-%Pred-Post: 67 %
FVC-%Pred-Pre: 61 %
FVC-Post: 3.81 L
FVC-Pre: 3.51 L
POST FEV1/FVC RATIO: 76 %
PRE FEV6/FVC RATIO: 99 %
Post FEV6/FVC ratio: 99 %
Pre FEV1/FVC ratio: 74 %
RV % PRED: 90 %
RV: 2.09 L
TLC % pred: 75 %
TLC: 5.84 L

## 2017-05-23 MED ORDER — PROMETHAZINE-CODEINE 6.25-10 MG/5ML PO SYRP: 5.0000 mL | ORAL_SOLUTION | Freq: Four times a day (QID) | ORAL | 0 refills | Status: DC | PRN

## 2017-05-23 MED ORDER — AMOXICILLIN-POT CLAVULANATE 875-125 MG PO TABS: 1.0000 | ORAL_TABLET | Freq: Two times a day (BID) | ORAL | 0 refills | Status: DC

## 2017-05-23 NOTE — Progress Notes (Signed)
Subjective:    Patient ID: Jerry Steele, male    DOB: May 16, 1966, 51 y.o.   MRN: [id number]  Synopsis: referred in 2018 for evaluation of cough.  HPI  Chief Complaint  Patient presents with  . Follow-up    review PFT.      He says that he still has the cough.  Sometimes he finds it is hard to breathe.  He fes like his heart is trying to comeo out of his chest.  He feels a pain in his chest when he coughs.  He is having some sensation that he can't fill his lungs.  He feel slike he can't take a deep breath.  He says taht talking makes this worse.  However he can walk far without feeling.    He has been taking the two heartburn medicines, the cetriizine and the nose spray.  He has produced some mucus from time time.    Past Medical History:  Diagnosis Date  . Pneumonia       Review of Systems  Constitutional: Negative for fever and unexpected weight change.  HENT: Positive for congestion. Negative for dental problem, ear pain, nosebleeds, postnasal drip, rhinorrhea, sinus pressure, sneezing, sore throat and trouble swallowing.   Eyes: Negative for redness and itching.  Respiratory: Positive for cough and shortness of breath. Negative for chest tightness and wheezing.   Cardiovascular: Negative for palpitations and leg swelling.  Gastrointestinal: Negative for nausea and vomiting.  Genitourinary: Negative for dysuria.  Musculoskeletal: Negative for joint swelling.  Skin: Negative for rash.  Neurological: Negative for headaches.  Hematological: Does not bruise/bleed easily.  Psychiatric/Behavioral: Negative for dysphoric mood. The patient is not nervous/anxious.        Objective:   Physical Exam  Vitals:   05/23/17 1330  BP: 116/70  Pulse: 82  SpO2: 96%  Weight: 185 lb (83.9 kg)  Height: 6\' 2"  (1.88 m)    Gen: well appearing HENT: OP clear, TM's clear, neck supple PULM: CTA B, normal percussion CV: RRR, no mgr, trace edema GI: BS+, soft,  nontender Derm: no cyanosis or rash Psyche: normal mood and affect   Labs: Candida, Setomelanoma, Aureodasaid, Phoma IgE normal  Chest imaging: 03/2017 CXR reviewed, some bronchitis changes but otherwise normal, personally reviewed October 2018 CT chest: Normal pulmonary parenchyma, no evidence of lesion or mass, images independently reviewed  Spirometry: October 2018: Ratio 77%, FVC decreased, 53% predicted In November 2018 ratio 76%, FEV1 2.90 L 66% predicted, FVC 3.81 L 67% predicted, total lung capacity 5.84 L 75% predicted, DLCO 25.32 mL 67% predicted  CBC    Component Value Date/Time   WBC 8.1 03/07/2017 1112   WBC 6.7 10/08/2015 1050   WBC 5.5 03/10/2009 2028   RBC 5.18 03/07/2017 1112   RBC 4.55 (A) 10/08/2015 1050   RBC 4.74 03/10/2009 2028   HGB 16.1 03/07/2017 1112   HCT 47.0 03/07/2017 1112   PLT 185 03/07/2017 1112   MCV 91 03/07/2017 1112   MCH 31.1 03/07/2017 1112   MCH 31.6 (A) 10/08/2015 1050   MCHC 34.3 03/07/2017 1112   MCHC 35.5 (A) 10/08/2015 1050   MCHC 33.2 03/10/2009 2028   RDW 13.7 03/07/2017 1112   LYMPHSABS 2.1 03/07/2017 1112   MONOABS 0.5 03/10/2009 2028   EOSABS 0.1 03/07/2017 1112   BASOSABS 0.0 03/07/2017 1112        Assessment & Plan:   Cough  Gastroesophageal reflux disease, esophagitis presence not specified  Acute bronchitis, unspecified organism  Fortunately [name] does not have evidence of underlying lung disease.  Unfortunately he still has persistent symptoms of bronchitis and cough.  But we went over the images from his CT chest and lung function testing today.  The only abnormality was some very mild central likely explains the slight decrease in his diffusion capacity.  At this time I see no benefit to inhaled bronchodilators because his lungs are clear.  However, I think it is reasonable to treat him with antibiotics.  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  Plan: For cough: Take Tussionex twice a day as needed for cough as needed  GERD: Keep taking protonix in the morning Take the pepcid at night  For the persistent bronchitis: Take the augmentin twice a day for the next 5 days  Let me know if your symptoms do not improve and then we can see you back again   Current Outpatient Medications:  .  albuterol (PROVENTIL HFA;VENTOLIN HFA) 108 (90 Base) MCG/ACT inhaler, Inhale 1-2 puffs into the lungs every 6 (six) hours as needed for wheezing or shortness of breath., Disp: 1 Inhaler, Rfl: 0 .  benzonatate (TESSALON) 200 MG capsule, Take 1 capsule (200 mg total) by mouth 3 (three) times daily as needed for cough., Disp: 60 capsule, Rfl: 1 .  cetirizine (ZYRTEC) 10 MG tablet, Take 1 tablet (10 mg total) by mouth at bedtime., Disp: 30 tablet, Rfl: 11 .  fluticasone (FLONASE) 50 MCG/ACT nasal spray, Place 2 sprays into both nostrils at bedtime., Disp: 16 g, Rfl: 2 .  pantoprazole (PROTONIX) 40 MG tablet, Take 1 tablet (40 mg total) by mouth daily., Disp: 30 tablet, Rfl: 3 .  promethazine-codeine (PHENERGAN WITH CODEINE) 6.25-10 MG/5ML syrup, Take 10 mLs by mouth every 4 (four) hours as needed for cough., Disp: 300 mL, Rfl: 0 .  sildenafil (REVATIO) 20 MG tablet, Use as instructed by physician. 1 to 3 tabs daily as needed, Disp: 60 tablet, Rfl: 5 .  amoxicillin-clavulanate (AUGMENTIN) 875-125 MG tablet, Take 1 tablet 2 (two) times daily by mouth., Disp: 10 tablet, Rfl: 0 .  promethazine-codeine (PHENERGAN WITH CODEINE) 6.25-10 MG/5ML syrup, Take 5 mLs every 6 (six) hours as needed by mouth for cough., Disp: 200 mL, Rfl: 0

## 2017-05-23 NOTE — Patient Instructions (Addendum)
For cough: Take Tussionex twice a day as needed for cough as needed  GERD: Keep taking protonix in the morning Take the pepcid at night  For the persistent bronchitis: Take the augmentin twice a day for the next 5 days  Let me know if your symptoms do not improve and then we can see you back again

## 2017-05-23 NOTE — Progress Notes (Signed)
PFT done today. 

## 2017-06-06 ENCOUNTER — Ambulatory Visit: Payer: Self-pay | Admitting: Family Medicine

## 2017-06-06 NOTE — Progress Notes (Incomplete)
° °  Subjective:  By signing my name below, I, Jerry Steele, attest that this documentation has been prepared under the direction and in the presence of ***, **. Electronically Signed: Ladene Artist, ED Scribe 06/06/2017 at 3:26 PM.   Patient ID: Jerry Steele, male    DOB: 24-Jul-1965, 51 y.o.   MRN: [id number]  No chief complaint on file.   HPI Jerry Steele is a 51 y.o. male who presents to Primary Care at [facility] complaining of *** Chronic cough over the past year he has frequently presented with acute illnesses accompanied by cough but began complaining of chronic dry cough, worse in the morning since the beginning of July. He has been treated with cetrizine, Flonase, hycodan, amoxicillin, ipratropium nasal spray ppi, decadron 10 mg IM, albuterol, tessalon, promethazine with codeine cough syrup, azithromycin. He has suspected that the basement that he's living may have black mold exacerbating allergies. Upon his requesting we did do blood test for mold allergy which were negative as well as normal ESR, TSH, CMP and CBC, CXR which showed central bronchial thickening suspicious for bronchitis and chest CT which was normal. He was seen by Dr. Lake Bells. Last visit 2 weeks ago and saw 1 of my colleagues here 3 wks ago for cough. Quit smoking end of September after 38 years but never smoked a lot of quantity. At pulmonology visit 2 wks ago, was complaining of continued cough with worsening dyspnea on exertion and even talking, cp with coughing, inability to take a deep breath. Dr. Lake Bells saw no benefit to albuterol but did try treatment with Augmentin x 5 days and refilled his promethazine-codeine cough syrup. Advised to continue on Protonix, Pepcid, Flonase and Zyrtec and f/u if symptoms do not improve.   Review of Systems    Objective:   Physical Exam There were no vitals taken for this visit.    Assessment & Plan:

## 2017-06-11 ENCOUNTER — Telehealth: Payer: Self-pay | Admitting: Family Medicine

## 2017-06-11 ENCOUNTER — Telehealth: Payer: Self-pay | Admitting: Pulmonary Disease

## 2017-06-11 MED ORDER — PREDNISONE 10 MG PO TABS: ORAL_TABLET | ORAL | 0 refills | Status: DC

## 2017-06-11 NOTE — Telephone Encounter (Signed)
He is still having cough with chest congestion.  He feels sore in his chest from coughing.  He doesn't smoke cigarettes.  He is not having wheeze, or fever.  He has almost completed course of augmentin.  Will call in course of prednisone 10 mg pill >> 3 pills daily for 2 days, 2 pills daily for 2 days, 1 pill daily for days.  Advised him to call and schedule ROV if not improved after this.

## 2017-06-11 NOTE — Telephone Encounter (Signed)
Error

## 2017-06-12 ENCOUNTER — Telehealth: Payer: Self-pay | Admitting: Pulmonary Disease

## 2017-06-12 NOTE — Telephone Encounter (Signed)
Called and spoke with pt and he is aware of appt with BQ tomorrow at 945.

## 2017-06-12 NOTE — Telephone Encounter (Signed)
Called and spoke with pt and he stated that he has a dry cough that will not go away.  He denies any fever at all.  He was given augmentin on 11/7 and he completed this.  He started the prednisone on 11/26 and he was given promethazine for the cough.  He stated that he is not any better.  He is wanting to know what else can do.  Please advise. Thanks  No Known Allergies

## 2017-06-12 NOTE — Telephone Encounter (Signed)
Reviewed his office notes.  He apparently was on current medication but I do not see it on his current medicine list.  He is on Zyrtec and steroid nasal spray.  Beyond treating his underlying contributors no other clear interventions to make beyond continuing the prednisone and hope that he will benefit from it.  He needs to follow-up with Dr. Lake Bells in our office to review his response to the prednisone and discuss next steps in the evaluation.

## 2017-06-13 ENCOUNTER — Ambulatory Visit: Payer: Self-pay | Admitting: Pulmonary Disease

## 2017-06-13 NOTE — Progress Notes (Deleted)
Subjective:    Patient ID: Jerry Steele, male    DOB: 11/29/1965, 51 y.o.   MRN: [id number]  Synopsis: referred in 2018 for evaluation of cough.  HPI No chief complaint on file.  ***  Past Medical History:  Diagnosis Date  . Pneumonia       Review of Systems  Constitutional: Negative for fever and unexpected weight change.  HENT: Positive for congestion. Negative for dental problem, ear pain, nosebleeds, postnasal drip, rhinorrhea, sinus pressure, sneezing, sore throat and trouble swallowing.   Eyes: Negative for redness and itching.  Respiratory: Positive for cough and shortness of breath. Negative for chest tightness and wheezing.   Cardiovascular: Negative for palpitations and leg swelling.  Gastrointestinal: Negative for nausea and vomiting.  Genitourinary: Negative for dysuria.  Musculoskeletal: Negative for joint swelling.  Skin: Negative for rash.  Neurological: Negative for headaches.  Hematological: Does not bruise/bleed easily.  Psychiatric/Behavioral: Negative for dysphoric mood. The patient is not nervous/anxious.        Objective:   Physical Exam There were no vitals filed for this visit.  ***  Labs: Candida, Setomelanoma, Aureodasaid, Phoma IgE normal  Chest imaging: 03/2017 CXR reviewed, some bronchitis changes but otherwise normal, personally reviewed October 2018 CT chest: Normal pulmonary parenchyma, no evidence of lesion or mass, images independently reviewed  Spirometry: October 2018: Ratio 77%, FVC decreased, 53% predicted In November 2018 ratio 76%, FEV1 2.90 L 66% predicted, FVC 3.81 L 67% predicted, total lung capacity 5.84 L 75% predicted, DLCO 25.32 mL 67% predicted  CBC    Component Value Date/Time   WBC 8.1 03/07/2017 1112   WBC 6.7 10/08/2015 1050   WBC 5.5 03/10/2009 2028   RBC 5.18 03/07/2017 1112   RBC 4.55 (A) 10/08/2015 1050   RBC 4.74 03/10/2009 2028   HGB 16.1 03/07/2017 1112   HCT 47.0 03/07/2017 1112   PLT  185 03/07/2017 1112   MCV 91 03/07/2017 1112   MCH 31.1 03/07/2017 1112   MCH 31.6 (A) 10/08/2015 1050   MCHC 34.3 03/07/2017 1112   MCHC 35.5 (A) 10/08/2015 1050   MCHC 33.2 03/10/2009 2028   RDW 13.7 03/07/2017 1112   LYMPHSABS 2.1 03/07/2017 1112   MONOABS 0.5 03/10/2009 2028   EOSABS 0.1 03/07/2017 1112   BASOSABS 0.0 03/07/2017 1112        Assessment & Plan:   No diagnosis found.  ***   Current Outpatient Medications:  .  albuterol (PROVENTIL HFA;VENTOLIN HFA) 108 (90 Base) MCG/ACT inhaler, Inhale 1-2 puffs into the lungs every 6 (six) hours as needed for wheezing or shortness of breath., Disp: 1 Inhaler, Rfl: 0 .  amoxicillin-clavulanate (AUGMENTIN) 875-125 MG tablet, Take 1 tablet 2 (two) times daily by mouth., Disp: 10 tablet, Rfl: 0 .  benzonatate (TESSALON) 200 MG capsule, Take 1 capsule (200 mg total) by mouth 3 (three) times daily as needed for cough., Disp: 60 capsule, Rfl: 1 .  cetirizine (ZYRTEC) 10 MG tablet, Take 1 tablet (10 mg total) by mouth at bedtime., Disp: 30 tablet, Rfl: 11 .  fluticasone (FLONASE) 50 MCG/ACT nasal spray, Place 2 sprays into both nostrils at bedtime., Disp: 16 g, Rfl: 2 .  pantoprazole (PROTONIX) 40 MG tablet, Take 1 tablet (40 mg total) by mouth daily., Disp: 30 tablet, Rfl: 3 .  predniSONE (DELTASONE) 10 MG tablet, 3 pills daily for 2 days, 2 pills daily for 2 days, 1 pill daily for 2 days, Disp: 12 tablet, Rfl: 0 .  promethazine-codeine (PHENERGAN  WITH CODEINE) 6.25-10 MG/5ML syrup, Take 10 mLs by mouth every 4 (four) hours as needed for cough., Disp: 300 mL, Rfl: 0 .  promethazine-codeine (PHENERGAN WITH CODEINE) 6.25-10 MG/5ML syrup, Take 5 mLs every 6 (six) hours as needed by mouth for cough., Disp: 200 mL, Rfl: 0 .  sildenafil (REVATIO) 20 MG tablet, Use as instructed by physician. 1 to 3 tabs daily as needed, Disp: 60 tablet, Rfl: 5

## 2017-06-20 ENCOUNTER — Ambulatory Visit (INDEPENDENT_AMBULATORY_CARE_PROVIDER_SITE_OTHER): Payer: Self-pay | Admitting: Pulmonary Disease

## 2017-06-20 ENCOUNTER — Ambulatory Visit (INDEPENDENT_AMBULATORY_CARE_PROVIDER_SITE_OTHER)
Admission: RE | Admit: 2017-06-20 | Discharge: 2017-06-20 | Disposition: A | Discharge status: Home / Self Care | Payer: Self-pay | Source: Ambulatory Visit | Attending: Pulmonary Disease | Admitting: Pulmonary Disease

## 2017-06-20 ENCOUNTER — Encounter: Payer: Self-pay | Admitting: Pulmonary Disease

## 2017-06-20 VITALS — BP 132/74 | HR 71 | Ht 75.0 in | Wt 184.0 lb

## 2017-06-20 DIAGNOSIS — J301 Allergic rhinitis due to pollen: Secondary | ICD-10-CM

## 2017-06-20 DIAGNOSIS — K219 Gastro-esophageal reflux disease without esophagitis: Secondary | ICD-10-CM

## 2017-06-20 DIAGNOSIS — R059 Cough, unspecified: Secondary | ICD-10-CM

## 2017-06-20 DIAGNOSIS — R05 Cough: Secondary | ICD-10-CM

## 2017-06-20 DIAGNOSIS — R079 Chest pain, unspecified: Secondary | ICD-10-CM

## 2017-06-20 MED ORDER — HYDROCOD POLST-CPM POLST ER 10-8 MG/5ML PO SUER: 5.0000 mL | Freq: Two times a day (BID) | ORAL | 0 refills | Status: DC | PRN

## 2017-06-20 NOTE — Progress Notes (Signed)
Subjective:    Patient ID: Jerry Steele, male    DOB: Jan 23, 1966, 51 y.o.   MRN: [id number]  Synopsis: referred in 2018 for evaluation of cough.  He is a smoker and lung function testing showed no evidence of airflow obstruction but a CT scan did show very mild emphysema  HPI Chief Complaint  Patient presents with  . Follow-up    cough, congestion, feels like broken rib   Jerry Steele presents today complaining of persistent cough: He says that things had improved initially but for the last 2-3 days he has been having worsening pain on the right side.  This started with a worsening cough.  Every time he coughs he has worsening sharp right-sided pain.  He says that his been trying to suppress his voice but when he talks more he coughs more frequently.  He tells that his dogs he has to cough more frequently.  He only produces mucus about once per day.  He says he thinks he may have mucus in there.  He has noted some sweats but no fever.  No recent trauma.  Shortness of breath is only mild.  He does use albuterol throughout the day.  He says he is using the [employer]  It is not clear to me if he is taking his gastroesophageal reflux therapy.  He says that he is taking the Zyrtec nightly per  Past Medical History:  Diagnosis Date  . Pneumonia       Review of Systems  Constitutional: Negative for fever and unexpected weight change.  HENT: Positive for congestion. Negative for dental problem, ear pain, nosebleeds, postnasal drip, rhinorrhea, sinus pressure, sneezing, sore throat and trouble swallowing.   Eyes: Negative for redness and itching.  Respiratory: Positive for cough and shortness of breath. Negative for chest tightness and wheezing.   Cardiovascular: Negative for palpitations and leg swelling.  Gastrointestinal: Negative for nausea and vomiting.  Genitourinary: Negative for dysuria.  Musculoskeletal: Negative for joint swelling.  Skin: Negative for rash.    Neurological: Negative for headaches.  Hematological: Does not bruise/bleed easily.  Psychiatric/Behavioral: Negative for dysphoric mood. The patient is not nervous/anxious.        Objective:   Physical Exam Vitals:   06/20/17 0912  BP: 132/74  Pulse: 71  SpO2: 97%  Weight: 184 lb (83.5 kg)  Height: 6\' 3"  (1.905 m)   Gen: mildly ill appearing HENT: Upper airway wheeze, OP clear, neck supple PULM: CTA B, normal effort CV: RRR, no mgr, trace edema GI: BS+, soft, nontender Derm: no cyanosis or rash Psyche: normal mood and affect  Labs: Candida, Setomelanoma, Aureodasaid, Phoma IgE normal  Chest imaging: 03/2017 CXR reviewed, some bronchitis changes but otherwise normal, personally reviewed October 2018 CT chest: Normal pulmonary parenchyma, no evidence of lesion or mass, images independently reviewed  Spirometry: October 2018: Ratio 77%, FVC decreased, 53% predicted In November 2018 ratio 76%, FEV1 2.90 L 66% predicted, FVC 3.81 L 67% predicted, total lung capacity 5.84 L 75% predicted, DLCO 25.32 mL 67% predicted  CBC    Component Value Date/Time   WBC 8.1 03/07/2017 1112   WBC 6.7 10/08/2015 1050   WBC 5.5 03/10/2009 2028   RBC 5.18 03/07/2017 1112   RBC 4.55 (A) 10/08/2015 1050   RBC 4.74 03/10/2009 2028   HGB 16.1 03/07/2017 1112   HCT 47.0 03/07/2017 1112   PLT 185 03/07/2017 1112   MCV 91 03/07/2017 1112   MCH 31.1 03/07/2017 1112   MCH  31.6 (A) 10/08/2015 1050   MCHC 34.3 03/07/2017 1112   MCHC 35.5 (A) 10/08/2015 1050   MCHC 33.2 03/10/2009 2028   RDW 13.7 03/07/2017 1112   LYMPHSABS 2.1 03/07/2017 1112   MONOABS 0.5 03/10/2009 2028   EOSABS 0.1 03/07/2017 1112   BASOSABS 0.0 03/07/2017 1112        Assessment & Plan:   Cough - Plan: DG Chest 2 View  Gastroesophageal reflux disease, esophagitis presence not specified  Allergic rhinitis due to pollen, unspecified seasonality  Chest pain, unspecified type  Discussion: He continues to  struggle with cough and now he has right-sided chest pain which may be due to a broken rib.  We will get a chest x-ray today to see if this is the case.  On physical exam his lungs are clear he only has an upper airway wheeze.  Lung testing this year (pulmonary function testing and CT scanning) showed only mild emphysema and no severe underlying lung disease. I believe that his cough is due to upper airway cough syndrome secondary to gastroesophageal reflux disease, cyclical cough from perpetual coughing, tobacco use, and allergic rhinitis.  At this point he really needs to work on suppressing his cough with voice rest.  We will get a chest x-ray today to make sure there is no evidence of pneumonia but I really doubt this.  Compliance and understanding of medication use is a barrier.  I spent a significant amount of time reviewing his medications as part of his medication reconciliation today.    Plan: For the chest pain: We will get a chest x-ray today to make sure you do not have a broken rib or pneumonia Use a pillow to hold your chest if you have to cough  Cough: Take Tussionex twice a day as needed to help with the cough Do not take Tussionex with another sedating medicine Stop using the codeine cough medicine You can use Tessalon Perles as needed during the daytime to help with cough You need to try to suppress your cough to allow your larynx (voice box) to heal.  For three days don't talk, laugh, sing, or clear your throat. Do everything you can to suppress the cough during this time. Use hard candies (sugarless Jolly Ranchers) or non-mint or non-menthol containing cough drops during this time to soothe your throat.  Use a cough suppressant (Delsym or what I have prescribed you) around the clock during this time.  After three days, gradually increase the use of your voice and back off on the cough suppressants.  Gastroesophageal reflux disease: Keep taking Pepcid twice a day  Allergic  rhinitis: Use Zyrtec daily Use Flonase 2 sprays each nostril daily  Shortness of breath: Use albuterol as needed for shortness of breath, every 4-6 hours as needed  We will see you back in 1 week with the nurse practitioner to make sure things are getting better      Current Outpatient Medications:  .  albuterol (PROVENTIL HFA;VENTOLIN HFA) 108 (90 Base) MCG/ACT inhaler, Inhale 1-2 puffs into the lungs every 6 (six) hours as needed for wheezing or shortness of breath., Disp: 1 Inhaler, Rfl: 0 .  benzonatate (TESSALON) 200 MG capsule, Take 1 capsule (200 mg total) by mouth 3 (three) times daily as needed for cough., Disp: 60 capsule, Rfl: 1 .  cetirizine (ZYRTEC) 10 MG tablet, Take 1 tablet (10 mg total) by mouth at bedtime., Disp: 30 tablet, Rfl: 11 .  fluticasone (FLONASE) 50 MCG/ACT nasal spray, Place  2 sprays into both nostrils at bedtime., Disp: 16 g, Rfl: 2 .  pantoprazole (PROTONIX) 40 MG tablet, Take 1 tablet (40 mg total) by mouth daily., Disp: 30 tablet, Rfl: 3 .  sildenafil (REVATIO) 20 MG tablet, Use as instructed by physician. 1 to 3 tabs daily as needed, Disp: 60 tablet, Rfl: 5 .  chlorpheniramine-HYDROcodone (TUSSIONEX PENNKINETIC ER) 10-8 MG/5ML SUER, Take 5 mLs by mouth every 12 (twelve) hours as needed for cough., Disp: 140 mL, Rfl: 0

## 2017-06-20 NOTE — Patient Instructions (Signed)
For the chest pain: We will get a chest x-ray today to make sure you do not have a broken rib or pneumonia Use a pillow to hold your chest if you have to cough  Cough: Take Tussionex twice a day as needed to help with the cough Do not take Tussionex with another sedating medicine Stop using the codeine cough medicine You can use Tessalon Perles as needed during the daytime to help with cough You need to try to suppress your cough to allow your larynx (voice box) to heal.  For three days don't talk, laugh, sing, or clear your throat. Do everything you can to suppress the cough during this time. Use hard candies (sugarless Jolly Ranchers) or non-mint or non-menthol containing cough drops during this time to soothe your throat.  Use a cough suppressant (Delsym or what I have prescribed you) around the clock during this time.  After three days, gradually increase the use of your voice and back off on the cough suppressants.  Gastroesophageal reflux disease: Keep taking Pepcid twice a day  Allergic rhinitis: Use Zyrtec daily Use Flonase 2 sprays each nostril daily  Shortness of breath: Use albuterol as needed for shortness of breath, every 4-6 hours as needed  We will see you back in 1 week with the nurse practitioner to make sure things are getting better

## 2017-06-27 ENCOUNTER — Ambulatory Visit (INDEPENDENT_AMBULATORY_CARE_PROVIDER_SITE_OTHER): Payer: Self-pay | Admitting: Adult Health

## 2017-06-27 ENCOUNTER — Encounter: Payer: Self-pay | Admitting: Adult Health

## 2017-06-27 VITALS — BP 126/80 | HR 82 | Ht 75.0 in | Wt 185.6 lb

## 2017-06-27 DIAGNOSIS — R059 Cough, unspecified: Secondary | ICD-10-CM

## 2017-06-27 DIAGNOSIS — R05 Cough: Secondary | ICD-10-CM

## 2017-06-27 DIAGNOSIS — R0781 Pleurodynia: Secondary | ICD-10-CM | POA: Insufficient documentation

## 2017-06-27 NOTE — Assessment & Plan Note (Signed)
Chronic cough - slowly improving . Suspect AR/GERD could be contributing factors. Workup has been unrevealing . CT chest ok. ESR ok .  Does have a bird in the home but  No sign of pneumonitis. If persists will need to do further testing.  Cont w/ cough control regimen /trigger control .   Plan  . Patient Instructions  Delsym 2 tsp .Twice daily  For cough As needed   Tessalon Three times a day for cough As needed   Protonix or Prilosec daily .  Zyrtec 39m At bedtime   Flonase 2 puffs daily.  Tylenol As needed  Pain .  Warm heat to ribs.  Use Pillow to ribs when coughing .  Mucinex Twice daily  As needed  Cough/congestion .  Follow up with Dr. MLake Bellsin 6-8 weeks and As needed   Please contact office for sooner follow up if symptoms do not improve or worsen or seek emergency care

## 2017-06-27 NOTE — Progress Notes (Signed)
$'@Patient'd$  ID: Jerry Steele, male    DOB: Feb 02, 1966, 51 y.o.   MRN: [id number]  Chief Complaint  Patient presents with  . Follow-up    Cough     Referring provider: Shawnee Knapp, MD  HPI: 51 yo former smoker (04/2017 )  seen for pulmonary consult for cough . Found to have mild emphysema on CT chest . 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   TEST   Chest imaging: 03/2017 CXR reviewed, some bronchitis changes but otherwise normal, personally reviewed October 2018 CT chest: Normal pulmonary parenchyma, no evidence of lesion or mass, images independently reviewed  Spirometry: October 2018: Ratio 77%, FVC decreased, 53% predicted In November 2018 ratio 76%, FEV1 2.90 L 66% predicted, FVC 3.81 L 67% predicted, total lung capacity 5.84 L 75% predicted, DLCO 25.32 mL 67% predicted  Labs :  Eosinophils ok. ESR ok . Mold allergen profile neg.    06/27/2017 Follow up ; Cough and Rib pain  Pt returns for 1 week follow up . Has been followed for ongoing cough and rib pain for 3 months . Workup has been unrevealing with No airflow obstruction on PFT. Restrictive dz noted. CT chest w/ mild emphysema. Labs w/ nml ESR and eosinophils., He was treated with Cough suppressants  With Tussionex and tessalon, zyrtec , protonix and flonase.  . Does feel cough is getting some better.  Still has right sided rib pain , very painful to touch , movement and coughing . Tussionex helps cough but not pain . Had steroid taper 2 weeks ago, no help. No rash or known injury . CXR last ov was normal .    No Known Allergies   There is no immunization history on file for this patient.  Past Medical History:  Diagnosis Date  . Pneumonia     Tobacco History: Social History   Tobacco Use  Smoking Status Former Smoker  . Packs/day: 0.25  . Years: 30.00  . Pack years: 7.50  . Types: Cigarettes  . Last attempt to quit: 04/17/2017  . Years since quitting: 0.1    Smokeless Tobacco Never Used   Counseling given: Not Answered   Outpatient Encounter Medications as of 06/27/2017  Medication Sig  . albuterol (PROVENTIL HFA;VENTOLIN HFA) 108 (90 Base) MCG/ACT inhaler Inhale 1-2 puffs into the lungs every 6 (six) hours as needed for wheezing or shortness of breath.  . benzonatate (TESSALON) 200 MG capsule Take 1 capsule (200 mg total) by mouth 3 (three) times daily as needed for cough.  . cetirizine (ZYRTEC) 10 MG tablet Take 1 tablet (10 mg total) by mouth at bedtime.  . chlorpheniramine-HYDROcodone (TUSSIONEX PENNKINETIC ER) 10-8 MG/5ML SUER Take 5 mLs by mouth every 12 (twelve) hours as needed for cough.  . fluticasone (FLONASE) 50 MCG/ACT nasal spray Place 2 sprays into both nostrils at bedtime.  . pantoprazole (PROTONIX) 40 MG tablet Take 1 tablet (40 mg total) by mouth daily.  . sildenafil (REVATIO) 20 MG tablet Use as instructed by physician. 1 to 3 tabs daily as needed   No facility-administered encounter medications on file as of 06/27/2017.      Review of Systems  Constitutional:   No  weight loss, night sweats,  Fevers, chills, fatigue, or  lassitude.  HEENT:   No headaches,  Difficulty swallowing,  Tooth/dental problems, or  Sore throat,                No sneezing, itching, ear  ache, nasal congestion, post nasal drip,   CV:  No chest pain,  Orthopnea, PND, swelling in lower extremities, anasarca, dizziness, palpitations, syncope.   GI  No heartburn, indigestion, abdominal pain, nausea, vomiting, diarrhea, change in bowel habits, loss of appetite, bloody stools.   Resp: No shortness of breath with exertion or at rest.  No excess mucus, no productive cough,  No non-productive cough,  No coughing up of blood.  No change in color of mucus.  No wheezing.  No chest wall deformity. +right rib pain /sore to touch   Skin: no rash or lesions.  GU: no dysuria, change in color of urine, no urgency or frequency.  No flank pain, no hematuria    MS:  No joint pain or swelling.  No decreased range of motion.  No back pain.    Physical Exam  BP 126/80 (BP Location: Left Arm, Cuff Size: Normal)   Pulse 82   Ht 6' 3"  (1.905 m)   Wt 185 lb 9.6 oz (84.2 kg)   SpO2 96%   BMI 23.20 kg/m   GEN: A/Ox3; pleasant , NAD, well nourished    HEENT:  [place]/AT,  EACs-clear, TMs-wnl, NOSE-clear, THROAT-clear, no lesions, no postnasal drip or exudate noted.   NECK:  Supple w/ fair ROM; no JVD; normal carotid impulses w/o bruits; no thyromegaly or nodules palpated; no lymphadenopathy.    RESP  Clear  P & A; w/o, wheezes/ rales/ or rhonchi. no accessory muscle use, no dullness to percussion, +anterior mid rib tenderness, no deformity noted, no rash or bruising .   CARD:  RRR, no m/r/g, no peripheral edema, pulses intact, no cyanosis or clubbing.  GI:   Soft & nt; nml bowel sounds; no organomegaly or masses detected.   Musco: Warm bil, no deformities or joint swelling noted.   Neuro: alert, no focal deficits noted.    Skin: Warm, no lesions or rashes    Lab Results:  CBC    Component Value Date/Time   WBC 8.1 03/07/2017 1112   WBC 6.7 10/08/2015 1050   WBC 5.5 03/10/2009 2028   RBC 5.18 03/07/2017 1112   RBC 4.55 (A) 10/08/2015 1050   RBC 4.74 03/10/2009 2028   HGB 16.1 03/07/2017 1112   HCT 47.0 03/07/2017 1112   PLT 185 03/07/2017 1112   MCV 91 03/07/2017 1112   MCH 31.1 03/07/2017 1112   MCH 31.6 (A) 10/08/2015 1050   MCHC 34.3 03/07/2017 1112   MCHC 35.5 (A) 10/08/2015 1050   MCHC 33.2 03/10/2009 2028   RDW 13.7 03/07/2017 1112   LYMPHSABS 2.1 03/07/2017 1112   MONOABS 0.5 03/10/2009 2028   EOSABS 0.1 03/07/2017 1112   BASOSABS 0.0 03/07/2017 1112    BMET    Component Value Date/Time   NA 139 03/07/2017 1112   K 4.7 03/07/2017 1112   CL 100 03/07/2017 1112   CO2 25 03/07/2017 1112   GLUCOSE 91 03/07/2017 1112   GLUCOSE 79 06/13/2015 1236   BUN 19 03/07/2017 1112   CREATININE 1.01 03/07/2017 1112    CREATININE 0.88 06/13/2015 1236   CALCIUM 9.5 03/07/2017 1112   GFRNONAA 85 03/07/2017 1112   GFRNONAA >89 06/13/2015 1236   GFRAA 98 03/07/2017 1112   GFRAA >89 06/13/2015 1236    BNP No results found for: BNP  ProBNP No results found for: PROBNP  Imaging: Dg Chest 2 View  Result Date: 06/20/2017 CLINICAL DATA:  Right upper chest pain.  Cough.  3-4 days duration. EXAM:  CHEST  2 VIEW COMPARISON:  03/25/2017 FINDINGS: Heart size is normal. Mediastinal shadows are normal. The lungs are clear. No bronchial thickening. No infiltrate, mass, effusion or collapse. Pulmonary vascularity is normal. No bony abnormality. IMPRESSION: Normal chest Electronically Signed   By: Nelson Chimes M.D.   On: 06/20/2017 14:40     Assessment & Plan:   Cough Chronic cough - slowly improving . Suspect AR/GERD could be contributing factors. Workup has been unrevealing . CT chest ok. ESR ok .  Does have a bird in the home but  No sign of pneumonitis. If persists will need to do further testing.  Cont w/ cough control regimen /trigger control .   Plan  . Patient Instructions  Delsym 2 tsp .Twice daily  For cough As needed   Tessalon Three times a day for cough As needed   Protonix or Prilosec daily .  Zyrtec 22m At bedtime   Flonase 2 puffs daily.  Tylenol As needed  Pain .  Warm heat to ribs.  Use Pillow to ribs when coughing .  Mucinex Twice daily  As needed  Cough/congestion .  Follow up with Dr. MLake Bellsin 6-8 weeks and As needed   Please contact office for sooner follow up if symptoms do not improve or worsen or seek emergency care       Rib pain Right rib pain -ongoing w/ Pleurisy . No rib fx noted on CT or cxr. Continue with cough control . Tylenol and heat. Hold on additional steroids for now  If persists may need to do bone scan .   Plan  Patient Instructions  Delsym 2 tsp .Twice daily  For cough As needed   Tessalon Three times a day for cough As needed   Protonix or Prilosec daily  .  Zyrtec 158mAt bedtime   Flonase 2 puffs daily.  Tylenol As needed  Pain .  Warm heat to ribs.  Use Pillow to ribs when coughing .  Mucinex Twice daily  As needed  Cough/congestion .  Follow up with Dr. McLake Bellsn 6-8 weeks and As needed   Please contact office for sooner follow up if symptoms do not improve or worsen or seek emergency care         TaRexene EdisonNP 06/27/2017

## 2017-06-27 NOTE — Progress Notes (Signed)
Reviewed, agree 

## 2017-06-27 NOTE — Assessment & Plan Note (Addendum)
Right rib pain -ongoing w/ Pleurisy . No rib fx noted on CT or cxr. Continue with cough control . Tylenol and heat. Hold on additional steroids for now  If persists may need to do bone scan .   Plan  Patient Instructions  Delsym 2 tsp .Twice daily  For cough As needed   Tessalon Three times a day for cough As needed   Protonix or Prilosec daily .  Zyrtec 10mg  At bedtime   Flonase 2 puffs daily.  Tylenol As needed  Pain .  Warm heat to ribs.  Use Pillow to ribs when coughing .  Mucinex Twice daily  As needed  Cough/congestion .  Follow up with Dr. Lake Bells in 6-8 weeks and As needed   Please contact office for sooner follow up if symptoms do not improve or worsen or seek emergency care

## 2017-06-27 NOTE — Patient Instructions (Addendum)
Delsym 2 tsp .Twice daily  For cough As needed   Tessalon Three times a day for cough As needed   Protonix or Prilosec daily .  Zyrtec 10mg  At bedtime   Flonase 2 puffs daily.  Tylenol As needed  Pain .  Warm heat to ribs.  Use Pillow to ribs when coughing .  Mucinex Twice daily  As needed  Cough/congestion .  Follow up with Dr. Lake Bells in 6-8 weeks and As needed   Please contact office for sooner follow up if symptoms do not improve or worsen or seek emergency care

## 2017-07-27 ENCOUNTER — Telehealth: Payer: Self-pay | Admitting: Pulmonary Disease

## 2017-07-27 MED ORDER — BENZONATATE 200 MG PO CAPS: 200.0000 mg | ORAL_CAPSULE | Freq: Three times a day (TID) | ORAL | 3 refills | Status: DC | PRN

## 2017-07-27 MED ORDER — ALBUTEROL SULFATE HFA 108 (90 BASE) MCG/ACT IN AERS: 1.0000 | INHALATION_SPRAY | Freq: Four times a day (QID) | RESPIRATORY_TRACT | 0 refills | Status: DC | PRN

## 2017-07-27 NOTE — Telephone Encounter (Signed)
Refill tessalon disp 45 refill 3 Refill proAir

## 2017-07-27 NOTE — Telephone Encounter (Signed)
Pt arrived in lobby, states that he has ran out of his tessalon perles and proair.  Since running out of these meds pt has had increased mostly nonprod cough with occasional clear mucus, sore throat, coughing to the point of R sided chest pain.  Pt denies fever, sinus congestion, body aches, chills.  Pt describes in detail when he coughs that it feels like his ribs are "rubbing against each other".    Pt requesting additional recs.  Uses [facility] on [address].   Has OV on 1/30 with BQ.  BQ please advise on recs. Thanks.

## 2017-07-27 NOTE — Telephone Encounter (Signed)
Spoke with pt and advised rx sent to pharmacy. Nothing further is needed.   

## 2017-08-02 ENCOUNTER — Telehealth: Payer: Self-pay | Admitting: Pulmonary Disease

## 2017-08-02 ENCOUNTER — Ambulatory Visit (INDEPENDENT_AMBULATORY_CARE_PROVIDER_SITE_OTHER)
Admission: RE | Admit: 2017-08-02 | Discharge: 2017-08-02 | Disposition: A | Discharge status: Home / Self Care | Payer: Self-pay | Source: Ambulatory Visit | Attending: Internal Medicine | Admitting: Internal Medicine

## 2017-08-02 ENCOUNTER — Encounter: Payer: Self-pay | Admitting: Internal Medicine

## 2017-08-02 ENCOUNTER — Ambulatory Visit (INDEPENDENT_AMBULATORY_CARE_PROVIDER_SITE_OTHER): Payer: Self-pay | Admitting: Internal Medicine

## 2017-08-02 VITALS — BP 110/80 | HR 77 | Temp 97.7°F | Ht 75.0 in | Wt 182.0 lb

## 2017-08-02 DIAGNOSIS — R05 Cough: Secondary | ICD-10-CM

## 2017-08-02 DIAGNOSIS — R059 Cough, unspecified: Secondary | ICD-10-CM

## 2017-08-02 MED ORDER — MOMETASONE FURO-FORMOTEROL FUM 100-5 MCG/ACT IN AERO: 2.0000 | INHALATION_SPRAY | Freq: Two times a day (BID) | RESPIRATORY_TRACT | 0 refills | Status: DC

## 2017-08-02 MED ORDER — PREDNISONE 10 MG PO TABS: ORAL_TABLET | ORAL | 0 refills | Status: DC

## 2017-08-02 NOTE — Patient Instructions (Addendum)
Continue Pantoprazole 40 mg Take 30-60 min before first meal of the day and pepcid ac 20 mg after supper (over the counter)   Prednisone 10 mg take  4 each am x 2 days,   2 each am x 2 days,  1 each am x 2 days and stop   Start Dulera 100 Take 2 puffs first thing in am and then another 2 puffs about 12 hours later.   Only use your albuterol as a rescue medication to be used if you can't catch your breath by resting or doing a relaxed purse lip breathing pattern.  - The less you use it, the better it will work when you need it. - Ok to use up to 2 puffs  every 4 hours if you must but call for immediate appointment if use goes up over your usual need - Don't leave home without it !!  (think of it like the spare tire for your car)     GERD (REFLUX)  is an extremely common cause of respiratory symptoms just like yours , many times with no obvious heartburn at all.    It can be treated with medication, but also with lifestyle changes including elevation of the head of your bed (ideally with 6 inch  bed blocks),  Smoking cessation, avoidance of late meals, excessive alcohol, and avoid fatty foods, chocolate, peppermint, colas, red wine, and acidic juices such as orange juice.  NO MINT OR MENTHOL PRODUCTS SO NO COUGH DROPS   USE SUGARLESS CANDY INSTEAD (Jolley ranchers or Stover's or Life Savers) or even ice chips will also do - the key is to swallow to prevent all throat clearing. NO OIL BASED VITAMINS - use powdered substitutes.   Keep appt with Dr Lake Bells but bring your all your medications with you

## 2017-08-02 NOTE — Progress Notes (Signed)
Subjective:     Patient ID: Jerry Steele, male   DOB: 1966-05-24, 52 y.o.   MRN: [id number]  HPI   52 yo latino male  With cough x 6  months s/p smoking cessation 04/2017 usually  better with prednisone / very poor insight into meds    08/02/2017 acute extended ov/Daiel Strohecker re: cough worse x 3 weeks  Chief Complaint  Patient presents with  . Acute Visit    Pt c/o increased SOB, cough and chest discomort for the past 3 wks. He is unsure what color his sputum is when he cougs it up. He states that his chest feels sore when he coughs. He states his symptoms are worse at night. He is using inhaler every night.   takes ppi  7am and eat at 8am but cough is worse p supper and hs/ min productive  Always better with steroids either im or po then worse gradually off of them  CP with coughing fits/ gen anterior  Doe= MMRC3 = can't walk 100 yards even at a slow pace at a flat grade s stopping due to sob    No obvious day to day or daytime variability or assoc excess/ purulent sputum or mucus plugs or hemoptysis or cp or chest tightness, subjective wheeze or overt sinus or hb symptoms. No unusual exposure hx or h/o childhood pna/ asthma or knowledge of premature birth.    Also denies any obvious fluctuation of symptoms with weather or environmental changes or other aggravating or alleviating factors except as outlined above   Current Allergies, Complete Past Medical History, Past Surgical History, Family History, and Social History were reviewed in [facility] record.  ROS  The following are not active complaints unless bolded Hoarseness, sore throat, dysphagia, dental problems, itching, sneezing,  nasal congestion or discharge of excess mucus or purulent secretions, ear ache,   fever, chills, sweats, unintended wt loss or wt gain, classically pleuritic or exertional cp,  orthopnea pnd or leg swelling, presyncope, palpitations, abdominal pain, anorexia, nausea, vomiting,  diarrhea  or change in bowel habits or change in bladder habits, change in stools or change in urine, dysuria, hematuria,  rash, arthralgias, visual complaints, headache, numbness, weakness or ataxia or problems with walking or coordination,  change in mood/affect or memory.        Current Meds  Medication Sig  . albuterol (PROVENTIL HFA;VENTOLIN HFA) 108 (90 Base) MCG/ACT inhaler Inhale 1-2 puffs into the lungs every 6 (six) hours as needed for wheezing or shortness of breath. ProAir  . fluticasone (FLONASE) 50 MCG/ACT nasal spray Place 2 sprays into both nostrils at bedtime.  . pantoprazole (PROTONIX) 40 MG tablet Take 1 tablet (40 mg total) by mouth daily.  . sildenafil (REVATIO) 20 MG tablet Use as instructed by physician. 1 to 3 tabs daily as needed             Review of Systems     Objective:   Physical Exam    amb latino male nad/ very difficult to understand, doe not know name of meds    Wt Readings from Last 3 Encounters:  08/02/17 182 lb (82.6 kg)  06/27/17 185 lb 9.6 oz (84.2 kg)  06/20/17 184 lb (83.5 kg)     Vital signs reviewed - Note on arrival 02 sats  96% on RA     HEENT: nl dentition, turbinates bilaterally, and oropharynx. Nl external ear canals without cough reflex   NECK :  without JVD/Nodes/TM/ nl carotid  upstrokes bilaterally   LUNGS: no acc muscle use,  Nl contour chest which is clear to A and P bilaterally without cough on insp or exp maneuvers   CV:  RRR  no s3 or murmur or increase in P2, and no edema   ABD:  soft and nontender with nl inspiratory excursion in the supine position. No bruits or organomegaly appreciated, bowel sounds nl  MS:  Nl gait/ ext warm without deformities, calf tenderness, cyanosis or clubbing No obvious joint restrictions   SKIN: warm and dry without lesions    NEURO:  alert, approp, nl sensorium with  no motor or cerebellar deficits apparent.     CXR PA and Lateral:   08/02/2017 :    I personally reviewed  images and agree with radiology impression as follows:    No edema or consolidation.  Assessment:

## 2017-08-02 NOTE — Telephone Encounter (Signed)
Brought pt back to a room from a lobby. Pt stated to me he has had a cough x3 weeks which has become worse. Pt now has a lot of chest discomfort as well as pain in ribs on right side.  Spoke with MW who was sitting on side where I brought pt back and told him pt is a BQ pt but is having these symptoms.  MW told me to order a stat chest xray on pt and then place him on schedule with him at 1:20 for him to be seen. CXR ordered and pt placed on MW's schedule at 1:20.

## 2017-08-03 ENCOUNTER — Encounter: Payer: Self-pay | Admitting: Internal Medicine

## 2017-08-03 NOTE — Assessment & Plan Note (Signed)
Lack of cough resolution on a verified empirical regimen could mean an alternative diagnosis, persistence of the disease state (eg sinusitis or bronchiectasis- ruled out by CT 05/07/17 ) , or inadequacy of currently available therapy (eg no medical rx available for non-acid gerd)   The first step is to trust but verify he's actually taking the meds he says he is as I had a great deal of difficulty every coming close to med reconciliation with him today   rec Trial of dulera 100 2bid  - 08/02/2017  After extensive coaching inhaler device  effectiveness =    75% / sample only  Max rx for gerd / diet  Prednisone 10 mg take  4 each am x 2 days,   2 each am x 2 days,  1 each am x 2 days and stop  F/u with dr Lake Bells in 2 weeks as already planned  I had an extended discussion with the patient reviewing all relevant studies completed to date and  lasting 15 to 20 minutes of a 25 minute acute visit    Each maintenance medication was reviewed in detail including most importantly the difference between maintenance and prns and under what circumstances the prns are to be triggered using an action plan format that is not reflected in the computer generated alphabetically organized AVS.    Please see AVS for specific instructions unique to this visit that I personally wrote and verbalized to the the pt in detail and then reviewed with pt  by my nurse highlighting any  changes in therapy recommended at today's visit to their plan of care.

## 2017-08-15 ENCOUNTER — Ambulatory Visit (INDEPENDENT_AMBULATORY_CARE_PROVIDER_SITE_OTHER): Payer: Self-pay | Admitting: Pulmonary Disease

## 2017-08-15 ENCOUNTER — Encounter: Payer: Self-pay | Admitting: Pulmonary Disease

## 2017-08-15 ENCOUNTER — Other Ambulatory Visit (INDEPENDENT_AMBULATORY_CARE_PROVIDER_SITE_OTHER): Payer: Self-pay

## 2017-08-15 VITALS — BP 114/72 | HR 68 | Ht 74.0 in | Wt 188.2 lb

## 2017-08-15 DIAGNOSIS — R05 Cough: Secondary | ICD-10-CM

## 2017-08-15 DIAGNOSIS — J45909 Unspecified asthma, uncomplicated: Secondary | ICD-10-CM

## 2017-08-15 DIAGNOSIS — R059 Cough, unspecified: Secondary | ICD-10-CM

## 2017-08-15 DIAGNOSIS — J432 Centrilobular emphysema: Secondary | ICD-10-CM

## 2017-08-15 DIAGNOSIS — J301 Allergic rhinitis due to pollen: Secondary | ICD-10-CM

## 2017-08-15 DIAGNOSIS — K219 Gastro-esophageal reflux disease without esophagitis: Secondary | ICD-10-CM

## 2017-08-15 LAB — CBC WITH DIFFERENTIAL/PLATELET
BASOS PCT: 0.7 % (ref 0.0–3.0)
Basophils Absolute: 0.1 10*3/uL (ref 0.0–0.1)
EOS ABS: 0.1 10*3/uL (ref 0.0–0.7)
Eosinophils Relative: 1.5 % (ref 0.0–5.0)
HEMATOCRIT: 44.9 % (ref 39.0–52.0)
Hemoglobin: 15.3 g/dL (ref 13.0–17.0)
LYMPHS ABS: 2 10*3/uL (ref 0.7–4.0)
Lymphocytes Relative: 23.3 % (ref 12.0–46.0)
MCHC: 34.2 g/dL (ref 30.0–36.0)
MCV: 89.6 fl (ref 78.0–100.0)
MONO ABS: 0.6 10*3/uL (ref 0.1–1.0)
Monocytes Relative: 6.6 % (ref 3.0–12.0)
NEUTROS ABS: 5.8 10*3/uL (ref 1.4–7.7)
NEUTROS PCT: 67.9 % (ref 43.0–77.0)
PLATELETS: 165 10*3/uL (ref 150.0–400.0)
RBC: 5.01 Mil/uL (ref 4.22–5.81)
RDW: 13.4 % (ref 11.5–15.5)
WBC: 8.6 10*3/uL (ref 4.0–10.5)

## 2017-08-15 LAB — POCT EXHALED NITRIC OXIDE: FeNO level (ppb): 11

## 2017-08-15 MED ORDER — MOMETASONE FURO-FORMOTEROL FUM 200-5 MCG/ACT IN AERO: 2.0000 | INHALATION_SPRAY | Freq: Two times a day (BID) | RESPIRATORY_TRACT | 0 refills | Status: DC

## 2017-08-15 NOTE — Progress Notes (Signed)
Subjective:    Patient ID: Jerry Steele, male    DOB: 1966/06/29, 52 y.o.   MRN: [id number]  Synopsis: referred in 2018 for evaluation of cough.  He is a smoker and lung function testing showed no evidence of airflow obstruction but a CT scan did show very mild emphysema  HPI Chief Complaint  Patient presents with  . Follow-up    Pt thinks something going on in his house. Pt was gone for one week out of the house, came back cough and chest tightness was back. 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  He had zero cough while there.  He says that when he returned ot [place] he had recurrent symptoms within 4 hours of return.  He says taht he lives in a duplex which is [address] years old and he lives in the basement.  He thinks that perhaps there is some mold in the house that makes him sick.  He says that that he thinks that the prednisone really helped him when Dr. Melvyn Steele gave him some.    GERD: he is taking protonix every day  Cough: he is taking the tessalon perles  Emphysema: he is using Dulera twice a day  Allergic rhinitis: he is using flonase on ly as needed. He take an "allergy pill" every night, he can't remember the name.  Past Medical History:  Diagnosis Date  . Pneumonia       Review of Systems  Constitutional: Negative for fever and unexpected weight change.  HENT: Positive for congestion. Negative for dental problem, ear pain, nosebleeds, postnasal drip, rhinorrhea, sinus pressure, sneezing, sore throat and trouble swallowing.   Eyes: Negative for redness and itching.  Respiratory: Positive for cough and shortness of breath. Negative for chest tightness and wheezing.   Cardiovascular: Negative for palpitations and leg swelling.  Gastrointestinal: Negative for nausea and vomiting.  Genitourinary: Negative for dysuria.    Musculoskeletal: Negative for joint swelling.  Skin: Negative for rash.  Neurological: Negative for headaches.  Hematological: Does not bruise/bleed easily.  Psychiatric/Behavioral: Negative for dysphoric mood. The patient is not nervous/anxious.        Objective:   Physical Exam Vitals:   08/15/17 0919  BP: 114/72  Pulse: 68  SpO2: 98%   Gen: well appearing HENT: OP clear, TM's clear, neck supple PULM: CTA B, normal percussion CV: RRR, no mgr, trace edema GI: BS+, soft, nontender Derm: no cyanosis or rash Psyche: normal mood and affect   Labs: Candida, Setomelanoma, Aureodasaid, Phoma IgE normal  Chest imaging: 03/2017 CXR reviewed, some bronchitis changes but otherwise normal, personally reviewed October 2018 CT chest: Normal pulmonary parenchyma, no evidence of lesion or mass, images independently reviewed  Spirometry: October 2018: Ratio 77%, FVC decreased, 53% predicted In November 2018 ratio 76%, FEV1 2.90 L 66% predicted, FVC 3.81 L 67% predicted, total lung capacity 5.84 L 75% predicted, DLCO 25.32 mL 67% predicted  Exhaled NO: 07/2017 on Dulera 11ppm  CBC    Component Value Date/Time   WBC 8.1 03/07/2017 1112   WBC 6.7 10/08/2015 1050   WBC 5.5 03/10/2009 2028   RBC 5.18 03/07/2017 1112   RBC 4.55 (A) 10/08/2015 1050   RBC 4.74 03/10/2009 2028   HGB 16.1 03/07/2017 1112   HCT 47.0 03/07/2017 1112   PLT 185 03/07/2017 1112  MCV 91 03/07/2017 1112   MCH 31.1 03/07/2017 1112   MCH 31.6 (A) 10/08/2015 1050   MCHC 34.3 03/07/2017 1112   MCHC 35.5 (A) 10/08/2015 1050   MCHC 33.2 03/10/2009 2028   RDW 13.7 03/07/2017 1112   LYMPHSABS 2.1 03/07/2017 1112   MONOABS 0.5 03/10/2009 2028   EOSABS 0.1 03/07/2017 1112   BASOSABS 0.0 03/07/2017 1112   Records from his visit with my partner reviewed where he was started on Dulera and given prednisone taper.      Assessment & Plan:   No diagnosis found.  Discussion: He returns with recurrent cough.  He  says that it was better when he was out of town, this could either be due to the prednisone or it could be due to the fact that there is a mold in his house.  He says there is a lot of mold in his house.  Medication compliance continues to be an issue.  It sounds as if he is compliant with the [facility] and the Protonix.  However he is not taking the allergic rhinitis regimen regularly.  I question whether or not he is still smoking.  He may need allergy testing, will start with blood work and and exhaled nitric oxide test today to try to get to the bottom of whether or not there is an allergic component to his cough.  Plan: Cough:  Due to upper airway cough syndrome from gastroesophageal reflux disease and allergic rhinitis, I question whether or not there may be some asthma Continue Dulera 2 puffs twice a day On return visit write prescription for Airduo Exhaled nitric oxide test today CBC with differential today, serum IgE Check serum cotinine level Continue Tessalon as needed  Gastroesophageal reflux disease: Continue pantoprazole daily  Allergic rhinitis:  Take cetirizine daily Restart mometasone nasal spray 2 sprays each nostril daily  Follow-up in 2 weeks with a nurse practitioner to go over the results of the blood work, may need referral to allergy at that point   Current Outpatient Medications:  .  albuterol (PROVENTIL HFA;VENTOLIN HFA) 108 (90 Base) MCG/ACT inhaler, Inhale 1-2 puffs into the lungs every 6 (six) hours as needed for wheezing or shortness of breath. ProAir, Disp: 1 Inhaler, Rfl: 0 .  cetirizine (ZYRTEC) 10 MG tablet, Take 1 tablet (10 mg total) by mouth at bedtime., Disp: 30 tablet, Rfl: 11 .  fluticasone (FLONASE) 50 MCG/ACT nasal spray, Place 2 sprays into both nostrils at bedtime., Disp: 16 g, Rfl: 2 .  mometasone-formoterol (DULERA) 100-5 MCG/ACT AERO, Inhale 2 puffs into the lungs 2 (two) times daily., Disp: 1 Inhaler, Rfl: 0 .  pantoprazole (PROTONIX) 40 MG  tablet, Take 1 tablet (40 mg total) by mouth daily., Disp: 30 tablet, Rfl: 3 .  sildenafil (REVATIO) 20 MG tablet, Use as instructed by physician. 1 to 3 tabs daily as needed, Disp: 60 tablet, Rfl: 5

## 2017-08-15 NOTE — Patient Instructions (Signed)
Cough: Due to upper airway cough syndrome from gastroesophageal reflux disease and allergic rhinitis, I question whether or not there may be some asthma Continue Dulera 2 puffs twice a day On return visit write prescription for Airduo Exhaled nitric oxide test today CBC with differential today, serum IgE Check serum cotinine level Continue Tessalon as needed  Gastroesophageal reflux disease: Continue pantoprazole daily  Allergic rhinitis:  Take cetirizine daily Restart mometasone nasal spray 2 sprays each nostril daily  Follow up in 2 weeks with the nurse practitioner

## 2017-08-16 LAB — RESPIRATORY ALLERGY PROFILE REGION II ~~LOC~~
Allergen, A. alternata, m6: <0.1 kU/L
Allergen, Cedar tree, t12: <0.1 kU/L
Allergen, Comm Silver Birch, t9: <0.1 kU/L
Allergen, Cottonwood, t14: <0.1 kU/L
Allergen, D pternoyssinus,d7: <0.1 kU/L
Allergen, Mouse Urine Protein, e78: <0.1 kU/L
Allergen, Mulberry, t76: <0.1 kU/L
Bermuda Grass: <0.1 kU/L
Box Elder IgE: <0.1 kU/L
CLASS: 0
CLASS: 0
CLASS: 0
CLASS: 0
CLASS: 0
CLASS: 0
CLASS: 0
CLASS: 0
CLASS: 0
Cat Dander: <0.1 kU/L
Class: 0
Class: 0
Class: 0
Class: 0
Class: 0
Class: 0
Class: 0
Class: 0
Class: 0
Class: 0
Class: 0
Class: 0
Class: 0
Class: 0
Class: 0
D. farinae: <0.1 kU/L
Dog Dander: <0.1 kU/L
Elm IgE: <0.1 kU/L
IgE (Immunoglobulin E), Serum: 12 kU/L (ref <=114)
Johnson Grass: <0.1 kU/L
Sheep Sorrel IgE: <0.1 kU/L

## 2017-08-16 LAB — INTERPRETATION:

## 2017-08-20 LAB — NICOTINE/COTININE METABOLITES
COTININE: NOT DETECTED ng/mL
NICOTINE: NOT DETECTED ng/mL

## 2017-08-29 ENCOUNTER — Other Ambulatory Visit: Payer: Self-pay

## 2017-08-29 ENCOUNTER — Encounter: Payer: Self-pay | Admitting: Adult Health

## 2017-08-29 ENCOUNTER — Ambulatory Visit (INDEPENDENT_AMBULATORY_CARE_PROVIDER_SITE_OTHER): Payer: Self-pay | Admitting: Adult Health

## 2017-08-29 VITALS — BP 118/68 | HR 70 | Ht 75.0 in | Wt 187.0 lb

## 2017-08-29 DIAGNOSIS — R05 Cough: Secondary | ICD-10-CM

## 2017-08-29 DIAGNOSIS — R059 Cough, unspecified: Secondary | ICD-10-CM

## 2017-08-29 MED ORDER — MOMETASONE FURO-FORMOTEROL FUM 200-5 MCG/ACT IN AERO: 2.0000 | INHALATION_SPRAY | Freq: Two times a day (BID) | RESPIRATORY_TRACT | 0 refills | Status: DC

## 2017-08-29 NOTE — Progress Notes (Signed)
Reviewed, agree 

## 2017-08-29 NOTE — Assessment & Plan Note (Addendum)
Chronic cough ? Etiology . Workup has been negative thus far however suspect heavy in house bird/pigeon exposure may be major trigger  Check HSP panel today .  Extensive discussion regarding dangers of in home bird exposure .   Plan  Patient Instructions  Delsym 2 tsp .Twice daily  For cough As needed   Tessalon Three times a day for cough As needed   Protonix or Prilosec daily .  Zyrtec 10mg  At bedtime   Flonase 2 puffs daily.  Mucinex Twice daily  As needed  Cough/congestion .  Continue on Dulera 2 puffs Twice daily   Labs today .  Follow up with Dr. Lake Bells in 2 months and As needed   Consider getting rid of birds in the house.

## 2017-08-29 NOTE — Progress Notes (Signed)
@Patient  ID: Jerry Steele, male    DOB: 1965/11/08, 52 y.o.   MRN: [id number]  Chief Complaint  Patient presents with  . Follow-up    Cough     Referring provider: Shawnee Knapp, MD  HPI: 52 yo former smoker (04/2017 )  seen for pulmonary consult for cough . Found to have mild emphysema on CT chest . 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   TEST  Chest imaging: 03/2017 CXR reviewed, some bronchitis changes but otherwise normal, personally reviewed October 2018 CT chest: Normal pulmonary parenchyma, no evidence of lesion or mass, images independently reviewed  Spirometry: October 2018: Ratio 77%, FVC decreased, 53% predicted In November 2018 ratio 76%, FEV1 2.90 L 66% predicted, FVC 3.81 L 67% predicted, total lung capacity 5.84 L 75% predicted, DLCO 25.32 mL 67% predicted  Labs :  Eosinophils ok. ESR ok . Mold allergen profile neg.   08/29/2017 Follow up : Chronic cough patient returns for a 2-week follow-up for her chronic cough.  Patient has had an extensive workup for chronic cough .  CT chest October 2018 was normal.  Pulmonary function test showed no airflow obstruction with mild restriction and a decreased diffusing capacity. CBC showed normal eosinophils.  Sed rate was okay.  Allergen profile was negative. IgE was 12 Exhaled nitric oxide testing was 11.  Nicotine testing was negative. Patient has been started on [facility] and recommended to use Zyrtec Flonase for allergic rhinitis symptoms.  Started on reflux medicines with Protonix/Prilosec.  Patient says that his cough has not improved any.  Patient did have a cough free.  When he was out of town at [employer] where he did not cough at all. Patient does admit today that he has birds inside of his house around 100 pigeons in a room that he keeps in his house.  We discussed that this may be a trigger for his cough.  Have recommended that he get these removed. previous HIV  testing was neg in 2016 and 2010.   No Known Allergies   There is no immunization history on file for this patient.  Past Medical History:  Diagnosis Date  . Pneumonia     Tobacco History: Social History   Tobacco Use  Smoking Status Former Smoker  . Packs/day: 0.25  . Years: 30.00  . Pack years: 7.50  . Types: Cigarettes  . Last attempt to quit: 04/17/2017  . Years since quitting: 0.3  Smokeless Tobacco Never Used   Counseling given: Not Answered   Outpatient Encounter Medications as of 08/29/2017  Medication Sig  . albuterol (PROVENTIL HFA;VENTOLIN HFA) 108 (90 Base) MCG/ACT inhaler Inhale 1-2 puffs into the lungs every 6 (six) hours as needed for wheezing or shortness of breath. ProAir  . cetirizine (ZYRTEC) 10 MG tablet Take 1 tablet (10 mg total) by mouth at bedtime.  . fluticasone (FLONASE) 50 MCG/ACT nasal spray Place 2 sprays into both nostrils at bedtime.  . mometasone-formoterol (DULERA) 200-5 MCG/ACT AERO Inhale 2 puffs into the lungs 2 (two) times daily.  . pantoprazole (PROTONIX) 40 MG tablet Take 1 tablet (40 mg total) by mouth daily.  . sildenafil (REVATIO) 20 MG tablet Use as instructed by physician. 1 to 3 tabs daily as needed  . mometasone-formoterol (DULERA) 200-5 MCG/ACT AERO Inhale 2 puffs into the lungs 2 (two) times daily.  . [DISCONTINUED] mometasone-formoterol (DULERA) 100-5 MCG/ACT AERO Inhale 2 puffs into the lungs 2 (two) times daily.  No facility-administered encounter medications on file as of 08/29/2017.      Review of Systems  Constitutional:   No  weight loss, night sweats,  Fevers, chills, fatigue, or  lassitude.  HEENT:   No headaches,  Difficulty swallowing,  Tooth/dental problems, or  Sore throat,                No sneezing, itching, ear ache,  +nasal congestion, post nasal drip,   CV:  No chest pain,  Orthopnea, PND, swelling in lower extremities, anasarca, dizziness, palpitations, syncope.   GI  No heartburn, indigestion,  abdominal pain, nausea, vomiting, diarrhea, change in bowel habits, loss of appetite, bloody stools.   Resp:    No wheezing.  No chest wall deformity  Skin: no rash or lesions.  GU: no dysuria, change in color of urine, no urgency or frequency.  No flank pain, no hematuria   MS:  No joint pain or swelling.  No decreased range of motion.  No back pain.    Physical Exam  BP 118/68 (BP Location: Left Arm, Cuff Size: Normal)   Pulse 70   Ht 6' 3"  (1.905 m)   Wt 187 lb (84.8 kg)   SpO2 96%   BMI 23.37 kg/m   GEN: A/Ox3; pleasant , NAD, thin    HEENT:  [place]/AT,  EACs-clear, TMs-wnl, NOSE-clear, THROAT-clear, no lesions, no postnasal drip or exudate noted.   NECK:  Supple w/ fair ROM; no JVD; normal carotid impulses w/o bruits; no thyromegaly or nodules palpated; no lymphadenopathy.    RESP  Clear  P & A; w/o, wheezes/ rales/ or rhonchi. no accessory muscle use, no dullness to percussion  CARD:  RRR, no m/r/g, no peripheral edema, pulses intact, no cyanosis or clubbing.  GI:   Soft & nt; nml bowel sounds; no organomegaly or masses detected.   Musco: Warm bil, no deformities or joint swelling noted.   Neuro: alert, no focal deficits noted.    Skin: Warm, no lesions or rashes    Lab Results:  CBC    Component Value Date/Time   WBC 8.6 08/15/2017 0949   RBC 5.01 08/15/2017 0949   HGB 15.3 08/15/2017 0949   HGB 16.1 03/07/2017 1112   HCT 44.9 08/15/2017 0949   HCT 47.0 03/07/2017 1112   PLT 165.0 08/15/2017 0949   PLT 185 03/07/2017 1112   MCV 89.6 08/15/2017 0949   MCV 91 03/07/2017 1112   MCH 31.1 03/07/2017 1112   MCH 31.6 (A) 10/08/2015 1050   MCHC 34.2 08/15/2017 0949   RDW 13.4 08/15/2017 0949   RDW 13.7 03/07/2017 1112   LYMPHSABS 2.0 08/15/2017 0949   LYMPHSABS 2.1 03/07/2017 1112   MONOABS 0.6 08/15/2017 0949   EOSABS 0.1 08/15/2017 0949   EOSABS 0.1 03/07/2017 1112   BASOSABS 0.1 08/15/2017 0949   BASOSABS 0.0 03/07/2017 1112    BMET    Component  Value Date/Time   NA 139 03/07/2017 1112   K 4.7 03/07/2017 1112   CL 100 03/07/2017 1112   CO2 25 03/07/2017 1112   GLUCOSE 91 03/07/2017 1112   GLUCOSE 79 06/13/2015 1236   BUN 19 03/07/2017 1112   CREATININE 1.01 03/07/2017 1112   CREATININE 0.88 06/13/2015 1236   CALCIUM 9.5 03/07/2017 1112   GFRNONAA 85 03/07/2017 1112   GFRNONAA >89 06/13/2015 1236   GFRAA 98 03/07/2017 1112   GFRAA >89 06/13/2015 1236    BNP No results found for: BNP  ProBNP No results found  for: PROBNP  Imaging: Dg Chest 2 View  Result Date: 08/02/2017 CLINICAL DATA:  Cough and congestion.  Shortness of breath. EXAM: CHEST  2 VIEW COMPARISON:  June 20, 2017 FINDINGS: Lungs are clear. Heart size and pulmonary vascularity are normal. No adenopathy. No bone lesions. IMPRESSION: No edema or consolidation. Electronically Signed   By: Lowella Grip III M.D.   On: 08/02/2017 12:57     Assessment & Plan:   Cough Chronic cough ? Etiology . Workup has been negative thus far however suspect heavy in house bird/pigeon exposure may be major trigger  Check HSP panel today .  Extensive discussion regarding dangers of in home bird exposure .   Plan  Patient Instructions  Delsym 2 tsp .Twice daily  For cough As needed   Tessalon Three times a day for cough As needed   Protonix or Prilosec daily .  Zyrtec 33m At bedtime   Flonase 2 puffs daily.  Mucinex Twice daily  As needed  Cough/congestion .  Continue on Dulera 2 puffs Twice daily   Labs today .  Follow up with Dr. MLake Bellsin 2 months and As needed   Consider getting rid of birds in the house.            TRexene Edison NP 08/29/2017

## 2017-08-29 NOTE — Patient Instructions (Addendum)
Delsym 2 tsp .Twice daily  For cough As needed   Tessalon Three times a day for cough As needed   Protonix or Prilosec daily .  Zyrtec 10mg  At bedtime   Flonase 2 puffs daily.  Mucinex Twice daily  As needed  Cough/congestion .  Continue on Dulera 2 puffs Twice daily   Labs today .  Follow up with Dr. Lake Bells in 2 months and As needed   Consider getting rid of birds in the house.

## 2017-09-03 LAB — HYPERSENSITIVITY PNEUMONITIS
A. FUMIGATUS #1 ABS: NEGATIVE
A. Pullulans Abs: NEGATIVE
MICROPOLYSPORA FAENI IGG: NEGATIVE
PIGEON SERUM ABS: POSITIVE — AB
THERMOACT. SACCHARII: NEGATIVE
Thermoactinomyces vulgaris, IgG: NEGATIVE

## 2017-09-04 NOTE — Progress Notes (Signed)
Was able to talk to the patient and the patient's son regarding results.  They verbalized an understanding of what was discussed. No further questions at this time.

## 2017-09-06 ENCOUNTER — Encounter: Payer: Self-pay | Admitting: Adult Health

## 2017-09-06 DIAGNOSIS — J679 Hypersensitivity pneumonitis due to unspecified organic dust: Secondary | ICD-10-CM | POA: Insufficient documentation

## 2017-09-22 ENCOUNTER — Encounter: Payer: Self-pay | Admitting: Family Medicine

## 2017-09-22 ENCOUNTER — Ambulatory Visit: Payer: Self-pay | Admitting: Family Medicine

## 2017-09-22 VITALS — BP 114/78 | HR 67 | Temp 99.1°F | Resp 18 | Ht 73.5 in | Wt 184.0 lb

## 2017-09-22 DIAGNOSIS — Z9109 Other allergy status, other than to drugs and biological substances: Secondary | ICD-10-CM

## 2017-09-22 DIAGNOSIS — J0100 Acute maxillary sinusitis, unspecified: Secondary | ICD-10-CM

## 2017-09-22 DIAGNOSIS — R6889 Other general symptoms and signs: Secondary | ICD-10-CM

## 2017-09-22 DIAGNOSIS — R062 Wheezing: Secondary | ICD-10-CM

## 2017-09-22 LAB — POC INFLUENZA A&B (BINAX/QUICKVUE)
Influenza A, POC: NEGATIVE
Influenza B, POC: NEGATIVE

## 2017-09-22 MED ORDER — AMOXICILLIN-POT CLAVULANATE 875-125 MG PO TABS: 1.0000 | ORAL_TABLET | Freq: Two times a day (BID) | ORAL | 0 refills | Status: DC

## 2017-09-22 MED ORDER — CETIRIZINE HCL 10 MG PO TABS: 10.0000 mg | ORAL_TABLET | Freq: Every day | ORAL | 11 refills | Status: DC

## 2017-09-22 MED ORDER — FLUTICASONE PROPIONATE 50 MCG/ACT NA SUSP: 2.0000 | Freq: Every day | NASAL | 2 refills | Status: DC

## 2017-09-22 MED ORDER — ALBUTEROL SULFATE (2.5 MG/3ML) 0.083% IN NEBU: 2.5000 mg | INHALATION_SOLUTION | Freq: Once | RESPIRATORY_TRACT | Status: DC

## 2017-09-22 MED ORDER — ALBUTEROL SULFATE HFA 108 (90 BASE) MCG/ACT IN AERS
2.0000 | INHALATION_SPRAY | Freq: Four times a day (QID) | RESPIRATORY_TRACT | 0 refills | Status: DC | PRN
Start: 2017-09-22 — End: 2017-09-22

## 2017-09-22 MED ORDER — ALBUTEROL SULFATE (2.5 MG/3ML) 0.083% IN NEBU
5.0000 mg | INHALATION_SOLUTION | Freq: Once | RESPIRATORY_TRACT | Status: AC
  Administered 2017-09-22: 5 mg via RESPIRATORY_TRACT

## 2017-09-22 MED ORDER — ALBUTEROL SULFATE HFA 108 (90 BASE) MCG/ACT IN AERS: 2.0000 | INHALATION_SPRAY | Freq: Four times a day (QID) | RESPIRATORY_TRACT | 0 refills | Status: DC | PRN

## 2017-09-22 NOTE — Progress Notes (Signed)
3/9/20193:47 PM  Jerry Steele February 28, 1966, 52 y.o. male [id number]  Chief Complaint  Patient presents with  . Headache  . Cough    Productive, hx emphysema  . Nasal Congestion  . Fatigue  . Generalized Body Aches    HPI:   Patient is a 52 y.o. male who presents today for generalized body aches, headache, tactile fevers, chills, nasal congestion, sinus pressure, sneezing, itchy eyes, cough mildly productive clear-cream, sob. Not sleeping at night. Not using albuterol. Has been taking OTC cold and sinus medication. No apap or nsaids today. Started about a week ago but getting worse.  Saw pulm 08/29/17 - negative workup for chronic cough, thought to be related to pigeons.  stopped smoking over 7 months ago Using only dulera Exposed to flu, no vaccine this season Son also sick Last abx augmentin in 05/2017 for bronchitis  Depression screen [facility] 2/9 09/22/2017 05/14/2017 04/09/2017  Decreased Interest 0 0 0  Down, Depressed, Hopeless 0 0 0  PHQ - 2 Score 0 0 0    No Known Allergies  Prior to Admission medications   Medication Sig Start Date End Date Taking? Authorizing Provider  fluticasone (FLONASE) 50 MCG/ACT nasal spray Place 2 sprays into both nostrils at bedtime. 05/14/17  Yes Forrest Moron, MD  albuterol (PROVENTIL HFA;VENTOLIN HFA) 108 (90 Base) MCG/ACT inhaler Inhale 1-2 puffs into the lungs every 6 (six) hours as needed for wheezing or shortness of breath. ProAir Patient not taking: Reported on 09/22/2017 07/27/17   Juanito Doom, MD  cetirizine (ZYRTEC) 10 MG tablet Take 1 tablet (10 mg total) by mouth at bedtime. Patient not taking: Reported on 09/22/2017 02/17/17   Shawnee Knapp, MD  mometasone-formoterol [facility]) 200-5 MCG/ACT AERO Inhale 2 puffs into the lungs 2 (two) times daily. Patient not taking: Reported on 09/22/2017 08/15/17   Juanito Doom, MD  mometasone-formoterol [facility]) 200-5 MCG/ACT AERO Inhale 2 puffs into the lungs 2 (two) times  daily. Patient not taking: Reported on 09/22/2017 08/29/17   Parrett, Fonnie Mu, NP  pantoprazole (PROTONIX) 40 MG tablet Take 1 tablet (40 mg total) by mouth daily. Patient not taking: Reported on 09/22/2017 04/09/17   Shawnee Knapp, MD  sildenafil (REVATIO) 20 MG tablet Use as instructed by physician. 1 to 3 tabs daily as needed Patient not taking: Reported on 09/22/2017 04/09/17   Shawnee Knapp, MD    Past Medical History:  Diagnosis Date  . Pneumonia     No past surgical history on file.  Social History   Tobacco Use  . Smoking status: Former Smoker    Packs/day: 0.25    Years: 30.00    Pack years: 7.50    Types: Cigarettes    Last attempt to quit: 04/17/2017    Years since quitting: 0.4  . Smokeless tobacco: Never Used  Substance Use Topics  . Alcohol use: No    Alcohol/week: 0.0 oz    No family history on file.  ROS Per hpi  OBJECTIVE:  Blood pressure 114/78, pulse 67, temperature 99.1 F (37.3 C), temperature source Oral, resp. rate 18, height 6' 1.5" (1.867 m), weight 184 lb (83.5 kg), SpO2 95 %.  Physical Exam  Constitutional: He is oriented to person, place, and time and well-developed, well-nourished, and in no distress.  HENT:  Head: Normocephalic and atraumatic.  Right Ear: Hearing, tympanic membrane, external ear and ear canal normal.  Left Ear: Hearing, tympanic membrane, external ear and ear canal normal.  Nose: Right  sinus exhibits maxillary sinus tenderness and frontal sinus tenderness. Left sinus exhibits maxillary sinus tenderness and frontal sinus tenderness.  Mouth/Throat: Oropharynx is clear and moist. No oropharyngeal exudate.  Eyes: Conjunctivae and EOM are normal. Pupils are equal, round, and reactive to light.  Neck: Neck supple.  Cardiovascular: Normal rate and regular rhythm. Exam reveals no gallop and no friction rub.  No murmur heard. Pulmonary/Chest: Effort normal. He has wheezes. He has no rales.  Lymphadenopathy:    He has no cervical  adenopathy.  Neurological: He is alert and oriented to person, place, and time. Gait normal.  Skin: Skin is warm and dry.    Wheezing resolved with albuterol  treatment  Results for orders placed or performed in visit on 09/22/17 (from the past 24 hour(s))  POC Influenza A&B(BINAX/QUICKVUE)     Status: None   Collection Time: 09/22/17  4:24 PM  Result Value Ref Range   Influenza A, POC Negative Negative   Influenza B, POC Negative Negative     ASSESSMENT and PLAN  1. Acute non-recurrent maxillary sinusitis Discussed supportive measures, new meds r/se/b and RTC precautions. Patient educational handout given.  2. Environmental allergies Discussed restarting medications to control allergic rhinitis. Has upcoming appt with allergist.   3. Flu-like symptoms - POC Influenza A&B(BINAX/QUICKVUE)  4. Wheezing - albuterol (PROVENTIL) (2.5 MG/3ML) 0.083% nebulizer solution 5 mg Discussed use of albuterol inhaler prn. RTC precautions reviewed.  Other orders - fluticasone (FLONASE) 50 MCG/ACT nasal spray; Place 2 sprays into both nostrils at bedtime. - cetirizine (ZYRTEC) 10 MG tablet; Take 1 tablet (10 mg total) by mouth at bedtime. - amoxicillin-clavulanate (AUGMENTIN) 875-125 MG tablet; Take 1 tablet by mouth 2 (two) times daily. - albuterol (PROVENTIL HFA;VENTOLIN HFA) 108 (90 Base) MCG/ACT inhaler; Inhale 2 puffs into the lungs every 6 (six) hours as needed for wheezing or shortness of breath.  Return if symptoms worsen or fail to improve.    Rutherford Guys, MD Primary Care at [place], [place] [address] Ph.  [phone] Fax [phone]

## 2017-09-22 NOTE — Patient Instructions (Signed)
     IF you received an x-ray today, you will receive an invoice from [facility]. Please contact [facility] at [phone] with questions or concerns regarding your invoice.   IF you received labwork today, you will receive an invoice from [facility]. Please contact [facility] at [phone] with questions or concerns regarding your invoice.   Our billing staff will not be able to assist you with questions regarding bills from these companies.  You will be contacted with the lab results as soon as they are available. The fastest way to get your results is to activate your My Chart account. Instructions are located on the last page of this paperwork. If you have not heard from us regarding the results in 2 weeks, please contact this office.     

## 2017-10-11 ENCOUNTER — Ambulatory Visit: Payer: Self-pay | Admitting: Family Medicine

## 2017-10-11 ENCOUNTER — Other Ambulatory Visit: Payer: Self-pay

## 2017-10-11 ENCOUNTER — Encounter: Payer: Self-pay | Admitting: Family Medicine

## 2017-10-11 VITALS — BP 127/87 | HR 86 | Temp 98.3°F | Resp 16 | Ht 73.5 in | Wt 186.4 lb

## 2017-10-11 DIAGNOSIS — M545 Low back pain, unspecified: Secondary | ICD-10-CM

## 2017-10-11 MED ORDER — PREDNISONE 20 MG PO TABS: ORAL_TABLET | ORAL | 0 refills | Status: DC

## 2017-10-11 MED ORDER — CYCLOBENZAPRINE HCL 10 MG PO TABS: 10.0000 mg | ORAL_TABLET | Freq: Every day | ORAL | 0 refills | Status: DC

## 2017-10-11 MED ORDER — KETOROLAC TROMETHAMINE 60 MG/2ML IM SOLN
60.0000 mg | Freq: Once | INTRAMUSCULAR | Status: AC
  Administered 2017-10-11: 60 mg via INTRAMUSCULAR

## 2017-10-11 MED ORDER — HYDROCODONE-ACETAMINOPHEN 10-325 MG PO TABS: 1.0000 | ORAL_TABLET | Freq: Four times a day (QID) | ORAL | 0 refills | Status: DC | PRN
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
## 2017-10-11 NOTE — Patient Instructions (Addendum)
IF you received an x-ray today, you will receive an invoice from [facility]. Please contact [facility] at [phone] with questions or concerns regarding your invoice.   IF you received labwork today, you will receive an invoice from [facility]. Please contact [facility] at [phone] with questions or concerns regarding your invoice.   Our billing staff will not be able to assist you with questions regarding bills from these companies.  You will be contacted with the lab results as soon as they are available. The fastest way to get your results is to activate your My Chart account. Instructions are located on the last page of this paperwork. If you have not heard from [place] regarding the results in 2 weeks, please contact this office.      Distensin lumbar con rehabilitacin (Low Back Strain With Rehab) Una distensin es un estiramiento o un desgarro en un msculo o los fuertes cordones de tejido que adhieren el msculo al hueso (tendones). Las distensiones en la parte inferior de la espalda (columna lumbar) son Ardelia Mems causa frecuente de dolor lumbar. Una distensin ocurre cuando los msculos o tendones se desgarran o se estiran ms all de su lmite. Los msculos se pueden inflamar y, por lo tanto, Actor dolor y Writer repentina del msculo (espasmos). Una distensin puede ocurrir de repente debido a una lesin (traumatismo) o se Psychologist, occupational gradualmente debido al [employer]. Hay tres tipos de distensiones:  El grado1 es una distensin leve que se caracteriza por un Scientist, clinical (histocompatibility and immunogenetics) de las fibras o tendones musculares. Esto puede provocar un poco de dolor, pero no hay prdida de fuerza muscular.  El grado2 es una distensin moderada producida por un desgarro parcial de las fibras o tendones musculares. Esto provoca un dolor ms intenso y cierta prdida de fuerza muscular.  El grado3 es una distensin grave e implica la ruptura completa del msculo o del  tendn. Esto causa un dolor intenso y la prdida completa o casi completa de la fuerza muscular. CAUSAS Esta afeccin puede ser causada por lo siguiente:  Traumatismo, debido, por ejemplo, a una cada o a un golpe en el cuerpo.  Torsin o hiperdistensin de la espalda. Esto puede ser el resultado de realizar actividades que requieren mucha energa, como levantar objetos pesados. [place] siguientes factores pueden aumentar el riesgo de sufrir esta afeccin:  Psychologist, prison and probation services deportes de contacto.  Participar en deportes o actividades que sobrecargan la espalda e implican mucha flexin y torsin, por ejemplo: ? Levantar pesas u objetos pesados. ? Gimnasia. ? Ftbol. ? Mayer artstico. ? Snowboard.  Tener exceso de [place] u obesidad.  Tener poca fuerza y flexibilidad. SNTOMAS Los sntomas de esta afeccin pueden incluir los siguientes:  Dolor agudo o sordo en la parte inferior de la espalda que no desaparece. El dolor se puede extender [employer].  Rigidez.  Amplitud de movimientos limitada.  Incapacidad para pararse derecho debido a la rigidez o al [employer].  Espasmos musculares. DIAGNSTICO Esta afeccin se puede diagnosticar en funcin de lo siguiente:  Sus sntomas.  Sus antecedentes mdicos.  Un examen fsico. ? El mdico puede presionar sobre ciertas zonas de la espalda para determinar el origen de su dolor. ? Le puede pedir que se incline [employer], [place] atrs y de un lado al otro para Chief of Staff la intensidad del dolor y la amplitud de [place].  Pruebas de diagnstico por imgenes, como: ? Radiografas. ? Resonancia magntica (RM). [place] esta afeccin puede  incluir lo siguiente:  Aplicar calor y fro en la zona afectada.  Tomar medicamentos para Best boy y Media planner los msculos (relajantes musculares).  Tomar antiinflamatorios no esteroides Dayna Ramus) para ayudar a Forensic psychologist y las  molestias.  Realizar fisioterapia. Cuando los sntomas mejoran, es importante volver a su rutina habitual tan pronto como sea posible para reducir Conservation officer, historic buildings, y Product/process development scientist la rigidez y la prdida de fuerza muscular. En general, los sntomas deberan mejorar en 6semanas de tratamiento. Sin embargo, el tiempo de recuperacin vara. INSTRUCCIONES PARA EL CUIDADO EN EL HOGAR Control del dolor, de la rigidez y de la hinchazn  Si se lo indic el mdico, aplique hielo en la zona afectada durante las primeras 24horas despus de la lesin. ? Ponga el hielo en una bolsa plstica. ? Coloque una [employer] piel y la bolsa de hielo. ? Coloque el hielo durante 79minutos, 2 a 3veces por da.  Si se lo indican, aplique calor en la zona afectada tan frecuentemente como se lo haya indicado el mdico. Use la fuente de calor que el mdico le recomiende, como una compresa de calor hmedo o una almohadilla trmica. ? Coloque una [employer] piel y la fuente de Freight forwarder. ? Aplique el calor durante 20 a 19minutos. ? Retire la fuente de calor si la piel se le pone de color rojo brillante. Esto es muy importante si no puede sentir el dolor, el calor o el fro. Puede correr un riesgo mayor de sufrir quemaduras. Actividad  Descanse y retome sus actividades normales como se lo haya indicado el mdico. Pregntele al mdico qu actividades son seguras para usted.  Evite las actividades que demandan mucho esfuerzo (que son extenuantes) durante el tiempo que le haya indicado el mdico.  Haga ejercicios como se lo haya indicado el mdico. Instrucciones generales  [employer] medicamentos de venta libre y los recetados solamente como se lo haya indicado el mdico.  Si tiene alguna pregunta o inquietud sobre la seguridad mientras toma analgsicos, hable con el mdico.  No conduzca ni opere maquinaria pesada hasta saber cmo lo afectan los analgsicos.  No consuma ningn producto que contenga tabaco, lo que incluye  cigarrillos, tabaco de Higher education careers adviser y Psychologist, sport and exercise. El tabaco puede retrasar el proceso de curacin. Si necesita ayuda para dejar de fumar, consulte al mdico.  Concurra a todas las visitas de control como se lo haya indicado el mdico. Esto es importante. PREVENCIN  Precaliente y elongue adecuadamente antes de la [place].  Reljese y elongue despus de realizar [place].  Dele a su cuerpo tiempo para [employer] perodos de [place].  Evite lo siguiente: ? Estar fsicamente inactivo durante perodos prolongados. ? Hacer ejercicio o practicar deportes cuando est cansado o dolorido.  Practique deportes y levante objetos pesados de la forma correcta.  Adopte una buena postura mientras est sentado y de pie.  Mantenga un peso saludable.  Duerma en un colchn de firmeza media para apoyar la columna.  Asegrese de Risk manager un equipo apto para usted, incluidos zapatos que [employer] Corporation.  Tome medidas de seguridad y sea responsable al hacer [place]asa Parkin, para evitar las cadas.  Haga por lo menos 188minutos de ejercicios de intensidad moderada cada semana, como caminar a paso ligero o hacer gimnasia acutica. Pruebe alguna forma de ejercicio que le quite tensin de la espalda, como nadar o Risk manager la bicicleta fija.  Mantenga un buen estado fsico, esto incluye lo siguiente: ? La fuerza. ? La flexibilidad. ?  La capacidad cardiovascular. ? La resistencia.  SOLICITE ATENCIN MDICA SI:  El dolor de espalda no mejora despus de 6semanas de [place].  Los sntomas empeoran.  SOLICITE ATENCIN MDICA DE INMEDIATO SI:  El dolor de espalda es muy intenso.  Nota que no puede pararse o caminar.  Siente dolor en las piernas.  Siente debilidad en las nalgas o en las piernas.  Tiene problemas para Aeronautical engineer miccin o los movimientos intestinales.  Esta informacin no tiene Marine scientist el consejo del mdico. Asegrese de hacerle al mdico  cualquier pregunta que tenga. Document Released: 04/19/2006 Document Revised: 11/17/2014 Document Reviewed: 04/14/2015 Elsevier Interactive Patient Education  2017 [place] Back Strain With Rehab Low Back Strain Rehab Consulte al mdico qu ejercicios son seguros para usted. Haga los ejercicios exactamente como se lo haya indicado el mdico y gradelos como se lo hayan indicado. Es normal sentir un leve estiramiento, tirn, rigidez o molestia cuando haga estos ejercicios, pero debe detenerse de inmediato si siento un dolor repentino o si el dolor empeora. No comience a hacer estos ejercicios hasta que se lo indique el mdico. EJERCICIOS DE Fiserv Y AMPLITUD DE MOVIMIENTOS Estos ejercicios calientan los msculos y las articulaciones, y [place] y la flexibilidad de la espalda. Estos ejercicios tambin ayudan a Best boy, el adormecimiento y el hormigueo. Ejercicio A: Rodilla al pecho 1. Acustese boca arriba en una superficie firme con las piernas extendidas. 2. Flexione una rodilla. Tome la rodilla con las manos y llvela hacia el pecho hasta que sienta un estiramiento suave en la parte inferior de la espalda y las nalgas ? Mantenga la otra pierna lo ms extendida posible. ? Mantenga la otra pierna lo ms extendida posible. 3. Mantenga esta posicin durante __________ segundos. 4. Vuelva lentamente a la posicin inicial. 5. Repita el ejercicio con la otra pierna.  Repita __________ veces. Realice este ejercicio __________ veces al da. Ejercicio B: Extensin ArvinMeritor codos, en decbito prono 1. Acustese boca abajo sobre una superficie firme. 2. Apyese sobre los codos. 3. Con los brazos, aydese a Counselling psychologist sentir un leve estiramiento en el abdomen y la parte inferior de la espalda. ? Training and development officer algo de [employer] codos. Si no se siente cmodo, intente colocando almohadas debajo del pecho. ? Debe dejar la cadera inmvil sobre la  superficie en la que est apoyado. Mantenga la cadera y los msculos de la espalda relajados. 4. Mantenga esta posicin durante __________ segundos. 5. Afloje lentamente la parte superior del cuerpo y vuelva a la posicin inicial.  Repita __________ veces. Realice este ejercicio __________ veces al da. STRENGTHENING EXERCISES Estos ejercicios fortalecen la espalda y le otorgan resistencia. La resistencia es la capacidad de usar los msculos durante un tiempo prolongado, incluso despus de que se cansen. Ejercicio C: Inclinacin de la pelvis 1. Acustese boca arriba sobre una superficie firme. [place] y [place]. 2. Tensione los msculos abdominales. Eleve la pelvis hacia el techo y aplane la parte inferior de la espalda contra el suelo. ? Para realizar este ejercicio, puede colocar una toalla pequea debajo de la parte inferior de la espalda y presionar la espalda contra la toalla. 3. Mantenga esta posicin durante __________ segundos. 4. Relaje totalmente los msculos antes de repetir el ejercicio.  Repita __________ veces. Realice este ejercicio __________ veces al da. Ejercicio D: Elevaciones alternadas de pierna y brazo 1. [place] manos y las [place]  sobre una superficie firme. Si se colocar sobre una superficie muy dura, puede usar un elemento acolchado para apoyar las rodillas, como una alfombrilla para ejercicios. 2. [place] los brazos y las piernas. Las manos deben estar debajo de los hombros y las rodillas debajo de la cadera. 3. Eleve la pierna izquierda hacia atrs. Al mismo tiempo, eleve el brazo derecho y Engineer, petroleum frente a usted. ? No eleve la pierna por encima de la cadera. ? No eleve el brazo por encima del hombro. ? Mantenga los msculos del abdomen y de la espalda contrados. ? Mantenga la cadera [employer] Motors el suelo. ? No arquee la espalda. ? Mantenga el equilibrio con cuidado y no contenga la respiracin. 4. Mantenga  esta posicin durante __________ segundos. 5. Lentamente regrese a la posicin inicial y repita el ejercicio con la pierna derecha y el brazo izquierdo.  Repita __________ veces. Realice este ejercicio __________ veces al da. Ejercicio J: Bajar una pierna con rodillas flexionadas 1. Acustese boca arriba sobre una superficie firme. 2. Apriete los msculos abdominales y Lub[employer]ol Corporation del piso, uno a la vez, de modo que las rodillas y la cadera estn flexionadas en forma de "L" (a aproximadamente 90 grados). ? Las rodillas deben estar por encima de la cadera y las pantorrillas deben quedar paralelas al piso. 3. Con los msculos abdominales tensos y la rodilla flexionada, baje lentamente una pierna de modo que los dedos del pie toquen el suelo. 4. [place] la pierna para volver a la posicin inicial. ? No contenga la respiracin. ? No deje que la espalda se arquee. Mantenga la espalda plana contra el suelo. 5. Repita el ejercicio con la otra pierna.  Repita __________ veces. Realice este ejercicio __________ veces al da. Theotis Barrio Y MECNICA CORPORAL La Therapist, nutritional se refiere a los movimientos y a las posiciones del cuerpo mientras realiza las actividades diarias. La postura es una parte de la Therapist, nutritional. La buena postura y la Engineer, agricultural corporal saludable pueden ayudar a Theatre stage manager estrs en las articulaciones y los tejidos del cuerpo. La buena postura significa que la columna mantiene su posicin natural de curvatura en forma de S (la columna est en una posicin neutral), los hombros Lucianne Lei un poco hacia atrs y la cabeza no se inclina hacia adelante. A continuacin, se incluyen pautas generales para mejorar la postura y Quarry manager en las actividades diarias. De pie  Al estar de pie, mantenga la columna en la posicin neutral y los pies separados al ancho de caderas, aproximadamente. Mantenga las rodillas ligeramente flexionadas. Las [place], los hombros y las caderas deben  estar alineados.  Cuando realice una tarea en la que deba estar de pie en el mismo sitio durante mucho tiempo, coloque un pie en un objeto estable de 2 a 4 pulgadas (5 a 10 cm) de alto, como un taburete. Esto ayuda a que la columna mantenga una posicin neutral.  Sentado  Cuando est sentado, mantenga la columna en posicin neutral y deje los pies apoyados en el suelo. Use un apoyapis, si es necesario, y [employer] muslos paralelos al suelo. Evite redondear los hombros e inclinar la cabeza hacia adelante.  Cuando trabaje en un escritorio o con una computadora, el escritorio debe estar a una altura en la que las manos estn un poco ms abajo que los codos. Deslice la silla debajo del escritorio, de modo de estar lo suficientemente cerca como para mantener una buena [place].  Cuando trabaje con una computadora, coloque el monitor  a Soil scientist derecho hacia adelante, sin tener que inclinar la cabeza hacia adelante o [place] atrs.  Reposo Al descansar o estar acostado, evite las posiciones que le causen ms dolor.  Si siente dolor al hacer actividades que exigen sentarse, inclinarse, agacharse o ponerse en cuclillas (actividades basadas en la flexin), acustese en una posicin en la que el cuerpo no deba doblarse mucho. Por ejemplo, evite acurrucarse de costado con los brazos y las rodillas cerca del pecho (posicin fetal).  Si siente dolor con las actividades que exigen estar de pie durante mucho tiempo o Training and development officer los brazos (actividades basadas en la extensin), acustese con la columna en una posicin neutral y flexione ligeramente las rodillas. Pruebe con las siguientes posiciones: ? Acupuncturist de costado con una almohada entre las rodillas. ? Acostarse boca arriba con una almohada debajo de las rodillas.  Levantar objetos  Cuando tenga que levantar un objeto, mantenga los pies separados el ancho de los hombros y apriete los msculos abdominales.  [place]  y la cadera, y Quarry manager la columna en posicin neutral. Es importante levantar utilizando la fuerza de las piernas, no de la espalda. No trabe las rodillas hacia afuera.  Siempre pida ayuda a otra persona para levantar objetos pesados o incmodos.  Esta informacin no tiene Marine scientist el consejo del mdico. Asegrese de hacerle al mdico cualquier pregunta que tenga. Document Released: 04/19/2006 Document Revised: 11/17/2014 Document Reviewed: 04/14/2015 Elsevier Interactive Patient Education  2018 [employer].

## 2017-10-15 ENCOUNTER — Encounter: Payer: Self-pay | Admitting: Physician Assistant

## 2017-10-29 ENCOUNTER — Ambulatory Visit: Payer: Self-pay | Admitting: Pulmonary Disease

## 2017-10-29 NOTE — Progress Notes (Deleted)
Subjective:    Patient ID: Jerry Steele, male    DOB: [birth year], 52 y.o.   MRN: [id number]  Synopsis: referred in 2018 for evaluation of cough.  He is a smoker and lung function testing showed no evidence of airflow obstruction but a CT scan did show very mild emphysema  HPI No chief complaint on file.  ***  Past Medical History:  Diagnosis Date  . Pneumonia       Review of Systems  Constitutional: Negative for fever and unexpected weight change.  HENT: Positive for congestion. Negative for dental problem, ear pain, nosebleeds, postnasal drip, rhinorrhea, sinus pressure, sneezing, sore throat and trouble swallowing.   Eyes: Negative for redness and itching.  Respiratory: Positive for cough and shortness of breath. Negative for chest tightness and wheezing.   Cardiovascular: Negative for palpitations and leg swelling.  Gastrointestinal: Negative for nausea and vomiting.  Genitourinary: Negative for dysuria.  Musculoskeletal: Negative for joint swelling.  Skin: Negative for rash.  Neurological: Negative for headaches.  Hematological: Does not bruise/bleed easily.  Psychiatric/Behavioral: Negative for dysphoric mood. The patient is not nervous/anxious.        Objective:   Physical Exam There were no vitals filed for this visit. Gen: well appearing HENT: OP clear, TM's clear, neck supple PULM: CTA B, normal percussion CV: RRR, no mgr, trace edema GI: BS+, soft, nontender Derm: no cyanosis or rash Psyche: normal mood and affect   Labs: Candida, [place]anoma, Aureodasaid, Phoma IgE normal  Chest imaging: 03/2017 CXR reviewed, some bronchitis changes but otherwise normal, personally reviewed October 2018 CT chest: Normal pulmonary parenchyma, no evidence of lesion or mass, images independently reviewed  Spirometry: October 2018: Ratio 77%, FVC decreased, 53% predicted In November 2018 ratio 76%, FEV1 2.90 L 66% predicted, FVC 3.81 L 67% predicted, total  lung capacity 5.84 L 75% predicted, DLCO 25.32 mL 67% predicted  Exhaled NO: 07/2017 on Dulera 11ppm  CBC    Component Value Date/Time   WBC 8.6 08/15/2017 0949   RBC 5.01 08/15/2017 0949   HGB 15.3 08/15/2017 0949   HGB 16.1 03/07/2017 1112   HCT 44.9 08/15/2017 0949   HCT 47.0 03/07/2017 1112   PLT 165.0 08/15/2017 0949   PLT 185 03/07/2017 1112   MCV 89.6 08/15/2017 0949   MCV 91 03/07/2017 1112   MCH 31.1 03/07/2017 1112   MCH 31.6 (A) 10/08/2015 1050   MCHC 34.2 08/15/2017 0949   RDW 13.4 08/15/2017 0949   RDW 13.7 03/07/2017 1112   LYMPHSABS 2.0 08/15/2017 0949   LYMPHSABS 2.1 03/07/2017 1112   MONOABS 0.6 08/15/2017 0949   EOSABS 0.1 08/15/2017 0949   EOSABS 0.1 03/07/2017 1112   BASOSABS 0.1 08/15/2017 0949   BASOSABS 0.0 03/07/2017 1112   Records from his visit with my partner reviewed where he was started on Dulera and given prednisone taper.      Assessment & Plan:   No diagnosis found.  ***   Current Outpatient Medications:  .  albuterol (PROVENTIL HFA;VENTOLIN HFA) 108 (90 Base) MCG/ACT inhaler, Inhale 2 puffs into the lungs every 6 (six) hours as needed for wheezing or shortness of breath., Disp: 1 Inhaler, Rfl: 0 .  cyclobenzaprine (FLEXERIL) 10 MG tablet, Take 1 tablet (10 mg total) by mouth at bedtime. May take 2 additional times during day prn muscle spasms but will cause sedation, Disp: 60 tablet, Rfl: 0 .  fluticasone (FLONASE) 50 MCG/ACT nasal spray, Place 2 sprays into both nostrils at bedtime., Disp: 16  g, Rfl: 2 .  HYDROcodone-acetaminophen (NORCO) 10-325 MG tablet, Take 1 tablet by mouth every 6 (six) hours as needed., Disp: 20 tablet, Rfl: 0 .  mometasone-formoterol (DULERA) 200-5 MCG/ACT AERO, Inhale 2 puffs into the lungs 2 (two) times daily., Disp: 1 Inhaler, Rfl: 0 .  predniSONE (DELTASONE) 20 MG tablet, Take 4 tabs qd x2d, 3 tabs qd x 2d, then 2 tabs qd x 3d then 1 tab qd x 3d, 1/2 tab qd x 2d., Disp: 24 tablet, Rfl: 0 .  sildenafil  (REVATIO) 20 MG tablet, Use as instructed by physician. 1 to 3 tabs daily as needed, Disp: 60 tablet, Rfl: 5

## 2017-11-19 ENCOUNTER — Ambulatory Visit: Payer: Self-pay | Admitting: Adult Health

## 2017-11-27 ENCOUNTER — Encounter: Payer: Self-pay | Admitting: Adult Health

## 2017-11-27 ENCOUNTER — Ambulatory Visit (INDEPENDENT_AMBULATORY_CARE_PROVIDER_SITE_OTHER): Payer: Self-pay | Admitting: Adult Health

## 2017-11-27 DIAGNOSIS — J679 Hypersensitivity pneumonitis due to unspecified organic dust: Secondary | ICD-10-CM

## 2017-11-27 DIAGNOSIS — J45991 Cough variant asthma: Secondary | ICD-10-CM

## 2017-11-27 MED ORDER — OMEPRAZOLE 20 MG PO CPDR: 20.0000 mg | DELAYED_RELEASE_CAPSULE | Freq: Every day | ORAL | 5 refills | Status: DC

## 2017-11-27 NOTE — Patient Instructions (Signed)
Delsym 2 tsp .Twice daily  For cough As needed   Tessalon Three times a day for cough As needed   Zyrtec 10mg  At bedtime   Prilosec 20mg  daily .  Flonase 2 puffs daily.  Mucinex Twice daily  As needed  Cough/congestion .  Restart Dulera 2 puffs Twice daily   Follow up with Dr. Lake Bells in 4 months and As needed   Highly recommend getting rid of birds/pigeon  in the house.

## 2017-11-27 NOTE — Progress Notes (Signed)
_0  ID: Jerry Steele, male    DOB: [birth year], 52 y.o.   MRN: [id number]  Chief Complaint  Patient presents with  . Follow-up    cough     Referring provider: Shawnee Knapp, MD  HPI: 52 yo former smoker (04/2017 ) seen for pulmonary consult for cough . Found to have mild  emphysema on CT chest . 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   TEST Chest imaging: 03/2017 CXR reviewed, some bronchitis changes but otherwise normal, personally reviewed October 2018 CT chest: Normal pulmonary parenchyma, no evidence of lesion or mass, images independently reviewed  Spirometry: October 2018: Ratio 77%, FVC decreased, 53% predicted In November 2018 ratio 76%, FEV1 2.90 L 66% predicted, FVC 3.81 L 67% predicted, total lung capacity 5.84 L 75% predicted, DLCO 25.32 mL 67% predicted  Labs :  Eosinophils ok. ESR ok . Mold allergen profile neg. Exhaled nitric oxide 11 , nicotine testing neg.  HIV neg 2010 ,2016  HSP - pigeon serum abx POSITIVE    11/27/2017 Follow up ; Chronic cough /Asthma  Pt returns for a 3 month follow up . Pt has been followed for chronic cough.  He has had an extensive work-up.  Appears to have probable component of chronic bronchitis and asthma.  As he had more restriction on PFT with no significant airflow obstruction but bronchodilator reversibility.  CT chest showed normal pulmonary parenchyma.  Lab work did reveal a positive pigeon serum antibody.  Patient does have multiple pigeons greater than 100 inside his home. We discussed last visit and today the importance of getting the patient's out of his home and avoidance of this essential trigger. Patient is on [facility] but says he does not use it on a regular basis.  Patient says his cough has been improved but has an intermittent dry cough.  Has been using Delsym and Tessalon.  He also is using Zyrtec for postnasal drainage.  He does have reflux and is on  Prilosec but needs a refill. He denies any chest pain, orthopnea, edema or hemoptysis.  Shortness of breath. O2 saturations on room air 99%.  Says he is recently hurt his back and is seen an orthopedist..  No Known Allergies   There is no immunization history on file for this patient.  Past Medical History:  Diagnosis Date  . Pneumonia     Tobacco History: Social History   Tobacco Use  Smoking Status Former Smoker  . Packs/day: 0.25  . Years: 30.00  . Pack years: 7.50  . Types: Cigarettes  . Last attempt to quit: 04/17/2017  . Years since quitting: 0.6  Smokeless Tobacco Never Used   Counseling given: Not Answered   Outpatient Encounter Medications as of 11/27/2017  Medication Sig  . albuterol (PROVENTIL HFA;VENTOLIN HFA) 108 (90 Base) MCG/ACT inhaler Inhale 2 puffs into the lungs every 6 (six) hours as needed for wheezing or shortness of breath.  . cyclobenzaprine (FLEXERIL) 10 MG tablet Take 1 tablet (10 mg total) by mouth at bedtime. May take 2 additional times during day prn muscle spasms but will cause sedation  . fluticasone (FLONASE) 50 MCG/ACT nasal spray Place 2 sprays into both nostrils at bedtime.  Marland Kitchen HYDROcodone-acetaminophen (NORCO) 10-325 MG tablet Take 1 tablet by mouth every 6 (six) hours as needed.  . mometasone-formoterol (DULERA) 200-5 MCG/ACT AERO Inhale 2 puffs into the lungs 2 (two) times daily.  . sildenafil (REVATIO) 20 MG tablet Use as instructed  by physician. 1 to 3 tabs daily as needed  . omeprazole (PRILOSEC) 20 MG capsule Take 1 capsule (20 mg total) by mouth daily.  . [DISCONTINUED] predniSONE (DELTASONE) 20 MG tablet Take 4 tabs qd x2d, 3 tabs qd x 2d, then 2 tabs qd x 3d then 1 tab qd x 3d, 1/2 tab qd x 2d. (Patient not taking: Reported on 11/27/2017)   No facility-administered encounter medications on file as of 11/27/2017.      Review of Systems  Constitutional:   No  weight loss, night sweats,  Fevers, chills, fatigue, or   lassitude.  HEENT:   No headaches,  Difficulty swallowing,  Tooth/dental problems, or  Sore throat,                No sneezing, itching, ear ache, nasal congestion, post nasal drip,   CV:  No chest pain,  Orthopnea, PND, swelling in lower extremities, anasarca, dizziness, palpitations, syncope.   GI  No heartburn, indigestion, abdominal pain, nausea, vomiting, diarrhea, change in bowel habits, loss of appetite, bloody stools.   Resp: No shortness of breath with exertion or at rest.  No excess mucus, no productive cough,  No non-productive cough,  No coughing up of blood.  No change in color of mucus.  No wheezing.  No chest wall deformity  Skin: no rash or lesions.  GU: no dysuria, change in color of urine, no urgency or frequency.  No flank pain, no hematuria   MS:  No joint pain or swelling.  No decreased range of motion.  + back pain.    Physical Exam  BP 106/72 (BP Location: Left Arm, Cuff Size: Normal)   Pulse 71   Ht 6' 3" (1.905 m)   Wt 192 lb 9.6 oz (87.4 kg)   SpO2 99%   BMI 24.07 kg/m   GEN: A/Ox3; pleasant , NAD, well nourished    HEENT:  [place]/AT,  EACs-clear, TMs-wnl, NOSE-clear, THROAT-clear, no lesions, no postnasal drip or exudate noted.   NECK:  Supple w/ fair ROM; no JVD; normal carotid impulses w/o bruits; no thyromegaly or nodules palpated; no lymphadenopathy.    RESP  Clear  P & A; w/o, wheezes/ rales/ or rhonchi. no accessory muscle use, no dullness to percussion  CARD:  RRR, no m/r/g, no peripheral edema, pulses intact, no cyanosis or clubbing.  GI:   Soft & nt; nml bowel sounds; no organomegaly or masses detected.   Musco: Warm bil, no deformities or joint swelling noted.   Neuro: alert, no focal deficits noted.    Skin: Warm, no lesions or rashes    Lab Results:  CBC    Component Value Date/Time   WBC 8.6 08/15/2017 0949   RBC 5.01 08/15/2017 0949   HGB 15.3 08/15/2017 0949   HGB 16.1 03/07/2017 1112   HCT 44.9 08/15/2017 0949   HCT  47.0 03/07/2017 1112   PLT 165.0 08/15/2017 0949   PLT 185 03/07/2017 1112   MCV 89.6 08/15/2017 0949   MCV 91 03/07/2017 1112   MCH 31.1 03/07/2017 1112   MCH 31.6 (A) 10/08/2015 1050   MCHC 34.2 08/15/2017 0949   RDW 13.4 08/15/2017 0949   RDW 13.7 03/07/2017 1112   LYMPHSABS 2.0 08/15/2017 0949   LYMPHSABS 2.1 03/07/2017 1112   MONOABS 0.6 08/15/2017 0949   EOSABS 0.1 08/15/2017 0949   EOSABS 0.1 03/07/2017 1112   BASOSABS 0.1 08/15/2017 0949   BASOSABS 0.0 03/07/2017 1112    BMET      Component Value Date/Time   NA 139 03/07/2017 1112   K 4.7 03/07/2017 1112   CL 100 03/07/2017 1112   CO2 25 03/07/2017 1112   GLUCOSE 91 03/07/2017 1112   GLUCOSE 79 06/13/2015 1236   BUN 19 03/07/2017 1112   CREATININE 1.01 03/07/2017 1112   CREATININE 0.88 06/13/2015 1236   CALCIUM 9.5 03/07/2017 1112   GFRNONAA 85 03/07/2017 1112   GFRNONAA >89 06/13/2015 1236   GFRAA 98 03/07/2017 1112   GFRAA >89 06/13/2015 1236    BNP No results found for: BNP  ProBNP No results found for: PROBNP  Imaging: No results found.   Assessment & Plan:   Cough variant asthma Suspect patient has cough variant asthma as he has more restriction and positive bronchodilator response on PFTs.  No significant airflow obstruction.  He did have some mild emphysema on CT chest. There is no evidence of pneumonitis on CT chest.  Although he does have a positive pigeon serum antigen on hypersensitivity pneumonitis panel.  It does not appear that he has clinical disease. Although have advised him that this is a potential trigger and to please remove the pigeons and birds from his home  Plan  Patient Instructions  Delsym 2 tsp .Twice daily  For cough As needed   Tessalon Three times a day for cough As needed   Zyrtec 10mg At bedtime   Prilosec 20mg daily .  Flonase 2 puffs daily.  Mucinex Twice daily  As needed  Cough/congestion .  Restart Dulera 2 puffs Twice daily   Follow up with Dr. McQuaid in 4  months and As needed   Highly recommend getting rid of birds/pigeon  in the house.         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  However patient does not appear to have clinical disease on CT chest. Advised on trigger prevention and avoidance Would recommend a follow-up PFT with DLCO in 1 year.  Depending on symptoms consider repeat CT with high-resolution cuts going forward .      Tammy Parrett, NP 11/27/2017  

## 2017-11-27 NOTE — Assessment & Plan Note (Signed)
HSP panel positive for pigeon serum.  However patient does not appear to have clinical disease on CT chest. Advised on trigger prevention and avoidance Would recommend a follow-up PFT with DLCO in 1 year.  Depending on symptoms consider repeat CT with high-resolution cuts going forward .

## 2017-11-27 NOTE — Progress Notes (Signed)
Reviewed, agree 

## 2017-11-27 NOTE — Assessment & Plan Note (Signed)
Suspect patient has cough variant asthma as he has more restriction and positive bronchodilator response on PFTs.  No significant airflow obstruction.  He did have some mild emphysema on CT chest. There is no evidence of pneumonitis on CT chest.  Although he does have a positive pigeon serum antigen on hypersensitivity pneumonitis panel.  It does not appear that he has clinical disease. Although have advised him that this is a potential trigger and to please remove the pigeons and birds from his home  Plan  Patient Instructions  Delsym 2 tsp .Twice daily  For cough As needed   Tessalon Three times a day for cough As needed   Zyrtec 10mg  At bedtime   Prilosec 20mg  daily .  Flonase 2 puffs daily.  Mucinex Twice daily  As needed  Cough/congestion .  Restart Dulera 2 puffs Twice daily   Follow up with Dr. Lake Bells in 4 months and As needed   Highly recommend getting rid of birds/pigeon  in the house.

## 2018-02-01 ENCOUNTER — Encounter: Payer: Self-pay | Admitting: Primary Care

## 2018-02-01 ENCOUNTER — Ambulatory Visit (INDEPENDENT_AMBULATORY_CARE_PROVIDER_SITE_OTHER): Payer: Self-pay | Admitting: Primary Care

## 2018-02-01 ENCOUNTER — Ambulatory Visit (INDEPENDENT_AMBULATORY_CARE_PROVIDER_SITE_OTHER)
Admission: RE | Admit: 2018-02-01 | Discharge: 2018-02-01 | Disposition: A | Discharge status: Home / Self Care | Payer: Self-pay | Source: Ambulatory Visit | Attending: Primary Care | Admitting: Primary Care

## 2018-02-01 VITALS — BP 126/80 | HR 85 | Temp 97.3°F | Ht 75.0 in | Wt 187.0 lb

## 2018-02-01 DIAGNOSIS — J45991 Cough variant asthma: Secondary | ICD-10-CM

## 2018-02-01 DIAGNOSIS — R0600 Dyspnea, unspecified: Secondary | ICD-10-CM

## 2018-02-01 MED ORDER — ALBUTEROL SULFATE HFA 108 (90 BASE) MCG/ACT IN AERS: 2.0000 | INHALATION_SPRAY | Freq: Four times a day (QID) | RESPIRATORY_TRACT | 0 refills | Status: DC | PRN

## 2018-02-01 MED ORDER — BUDESONIDE-FORMOTEROL FUMARATE 160-4.5 MCG/ACT IN AERO: 2.0000 | INHALATION_SPRAY | Freq: Two times a day (BID) | RESPIRATORY_TRACT | 6 refills | Status: DC

## 2018-02-01 NOTE — Assessment & Plan Note (Addendum)
-   Dulera changed to symbicort, samples given and will help set up patient assistance - Given samples of delsym cough syrup and mucinex  - Albuterol rescue inhaler refill sent to pharmacy

## 2018-02-01 NOTE — Patient Instructions (Addendum)
Delsym cough syrup- take in am and pm for cough  Mucinex twice a day- for congestion  Symbicort inhaler- Take 2 puffs morning and night EVERY DAY (needs patient assistance)   FU in 1 month with Dr. Lake Bells or Eustaquio Maize

## 2018-02-01 NOTE — Progress Notes (Signed)
@Patient  ID: Jerry Steele, male    DOB: 03/18/1966, 52 y.o.   MRN: [id number]  Chief Complaint  Patient presents with  . Acute Visit    Increased SOB x 3 weeks, feels tight pressure in his chest and throat. Incresaed mucous production.     Referring provider: Shawnee Knapp, MD  HPI: 52 year old male, former smoker quit in 2018.  Patient of Dr. Lake Bells, last seen by Baltazar Apo in May. Follows with pulmonology for chronic cough, probable asthmatic component. CT scan showed emphysema. Pulmonary function tests with no airflow obstruction, mild to moderate restriction. Prescribed Dulera and as needed albuterol.  HSP (pigeon serum abx) positive.  02/01/2018 Pt presents today with complaints of increase in cough and wheeze x3 weeks.  He ran out of his medications.  Has not been taking his inhalers as prescribed. Patient unable to afford Dulera, samples of symbicort given and will set up patient assistance.   No Known Allergies   There is no immunization history on file for this patient.  Past Medical History:  Diagnosis Date  . Pneumonia     Tobacco History: Social History   Tobacco Use  Smoking Status Former Smoker  . Packs/day: 0.25  . Years: 30.00  . Pack years: 7.50  . Types: Cigarettes  . Last attempt to quit: 04/17/2017  . Years since quitting: 0.7  Smokeless Tobacco Never Used   Counseling given: Not Answered   Outpatient Medications Prior to Visit  Medication Sig Dispense Refill  . cyclobenzaprine (FLEXERIL) 10 MG tablet Take 1 tablet (10 mg total) by mouth at bedtime. May take 2 additional times during day prn muscle spasms but will cause sedation 60 tablet 0  . fluticasone (FLONASE) 50 MCG/ACT nasal spray Place 2 sprays into both nostrils at bedtime. 16 g 2  . HYDROcodone-acetaminophen (NORCO) 10-325 MG tablet Take 1 tablet by mouth every 6 (six) hours as needed. 20 tablet 0  . mometasone-formoterol (DULERA) 200-5 MCG/ACT AERO Inhale 2 puffs into  the lungs 2 (two) times daily. 1 Inhaler 0  . omeprazole (PRILOSEC) 20 MG capsule Take 1 capsule (20 mg total) by mouth daily. 30 capsule 5  . sildenafil (REVATIO) 20 MG tablet Use as instructed by physician. 1 to 3 tabs daily as needed 60 tablet 5  . albuterol (PROVENTIL HFA;VENTOLIN HFA) 108 (90 Base) MCG/ACT inhaler Inhale 2 puffs into the lungs every 6 (six) hours as needed for wheezing or shortness of breath. 1 Inhaler 0   No facility-administered medications prior to visit.     Review of Systems  Review of Systems  Constitutional: Negative.   HENT: Negative.   Respiratory: Positive for cough and wheezing.   Cardiovascular: Negative.   Gastrointestinal: Negative.   Skin: Negative.     Physical Exam  BP 126/80   Pulse 85   Temp (!) 97.3 F (36.3 C) (Oral)   Ht 6\' 3"  (1.905 m)   Wt 187 lb (84.8 kg)   SpO2 98%   BMI 23.37 kg/m  Physical Exam  Constitutional: He is oriented to person, place, and time. He appears well-developed and well-nourished.  HENT:  Head: Normocephalic and atraumatic.  Eyes: Pupils are equal, round, and reactive to light. EOM are normal.  Neck: Normal range of motion. Neck supple.  Cardiovascular: Normal rate, regular rhythm and normal heart sounds.  Pulmonary/Chest: Effort normal. No respiratory distress. He has wheezes.  Abdominal: Soft. Bowel sounds are normal. There is no tenderness.  Neurological: He is alert  and oriented to person, place, and time.  Skin: Skin is warm and dry. No rash noted. No erythema.  Mildly diaphoretic  Psychiatric: He has a normal mood and affect. His behavior is normal. Judgment normal.     Lab Results:  CBC    Component Value Date/Time   WBC 8.6 08/15/2017 0949   RBC 5.01 08/15/2017 0949   HGB 15.3 08/15/2017 0949   HGB 16.1 03/07/2017 1112   HCT 44.9 08/15/2017 0949   HCT 47.0 03/07/2017 1112   PLT 165.0 08/15/2017 0949   PLT 185 03/07/2017 1112   MCV 89.6 08/15/2017 0949   MCV 91 03/07/2017 1112   MCH  31.1 03/07/2017 1112   MCH 31.6 (A) 10/08/2015 1050   MCHC 34.2 08/15/2017 0949   RDW 13.4 08/15/2017 0949   RDW 13.7 03/07/2017 1112   LYMPHSABS 2.0 08/15/2017 0949   LYMPHSABS 2.1 03/07/2017 1112   MONOABS 0.6 08/15/2017 0949   EOSABS 0.1 08/15/2017 0949   EOSABS 0.1 03/07/2017 1112   BASOSABS 0.1 08/15/2017 0949   BASOSABS 0.0 03/07/2017 1112    BMET    Component Value Date/Time   NA 139 03/07/2017 1112   K 4.7 03/07/2017 1112   CL 100 03/07/2017 1112   CO2 25 03/07/2017 1112   GLUCOSE 91 03/07/2017 1112   GLUCOSE 79 06/13/2015 1236   BUN 19 03/07/2017 1112   CREATININE 1.01 03/07/2017 1112   CREATININE 0.88 06/13/2015 1236   CALCIUM 9.5 03/07/2017 1112   GFRNONAA 85 03/07/2017 1112   GFRNONAA >89 06/13/2015 1236   GFRAA 98 03/07/2017 1112   GFRAA >89 06/13/2015 1236    BNP No results found for: BNP  ProBNP No results found for: PROBNP  Imaging: Dg Chest 2 View  Result Date: 02/01/2018 CLINICAL DATA:  Dyspnea and productive cough. EXAM: CHEST - 2 VIEW COMPARISON:  08/02/2017 FINDINGS: Both lungs are clear. Heart and mediastinum are within normal limits. Trachea is midline. No large pleural effusions. Bone structures are unremarkable. IMPRESSION: No active cardiopulmonary disease. Electronically Signed   By: Markus Daft M.D.   On: 02/01/2018 16:17     Assessment & Plan:   64-year-old male, former smoker.  History of chronic cough with underlying asthma.  Patient presents today with a chief complaint of cough and wheeze x3 weeks.  He has been unable to afford his medications and has run out of his prescriptions.  Appears well, no respiratory distress.  No use of accessory muscles.  Mildly diaphoretic. Vital signs stable, afebrile. Chest x-ray clear.  Dulera change to Symbicort due to cost inability to give samples today in office.  We will help to set up patient assistance. Follow up in 1 month to ensure patient is adhering to above plan . Cough variant asthma -  Dulera changed to symbicort, samples given and will help set up patient assistance - Given samples of delsym cough syrup and mucinex  - Albuterol rescue inhaler refill sent to [place], NP 02/01/2018    Three weeks ago started not feeling well. Complains of cough, sob. Out of medication. No inhalers.

## 2018-02-04 NOTE — Progress Notes (Signed)
Reviewed, agreed 

## 2018-03-08 ENCOUNTER — Ambulatory Visit: Payer: Self-pay | Admitting: Pulmonary Disease

## 2018-03-08 NOTE — Progress Notes (Deleted)
Subjective:    Patient ID: Jerry Steele, male    DOB: 1966-02-09, 52 y.o.   MRN: [id number]  Synopsis: referred in 2018 for evaluation of cough.  He is a smoker and lung function testing showed no evidence of airflow obstruction but a CT scan did show very mild emphysema  HPI No chief complaint on file.  ***  Past Medical History:  Diagnosis Date  . Pneumonia       Review of Systems  Constitutional: Negative for fever and unexpected weight change.  HENT: Positive for congestion. Negative for dental problem, ear pain, nosebleeds, postnasal drip, rhinorrhea, sinus pressure, sneezing, sore throat and trouble swallowing.   Eyes: Negative for redness and itching.  Respiratory: Positive for cough and shortness of breath. Negative for chest tightness and wheezing.   Cardiovascular: Negative for palpitations and leg swelling.  Gastrointestinal: Negative for nausea and vomiting.  Genitourinary: Negative for dysuria.  Musculoskeletal: Negative for joint swelling.  Skin: Negative for rash.  Neurological: Negative for headaches.  Hematological: Does not bruise/bleed easily.  Psychiatric/Behavioral: Negative for dysphoric mood. The patient is not nervous/anxious.        Objective:   Physical Exam There were no vitals filed for this visit.  ***   Labs: Candida, Setomelanoma, Aureodasaid, Phoma IgE normal 2018 Hypersensitivity pneumonitis panel positive pigeon serum  Chest imaging: 03/2017 CXR reviewed, some bronchitis changes but otherwise normal, personally reviewed October 2018 CT chest: Normal pulmonary parenchyma, no evidence of lesion or mass, images independently reviewed  Spirometry: October 2018: Ratio 77%, FVC decreased, 53% predicted In November 2018 ratio 76%, FEV1 2.90 L 66% predicted, FVC 3.81 L 67% predicted, total lung capacity 5.84 L 75% predicted, DLCO 25.32 mL 67% predicted  Exhaled NO: 07/2017 on Dulera 11ppm  CBC    Component Value Date/Time     WBC 8.6 08/15/2017 0949   RBC 5.01 08/15/2017 0949   HGB 15.3 08/15/2017 0949   HGB 16.1 03/07/2017 1112   HCT 44.9 08/15/2017 0949   HCT 47.0 03/07/2017 1112   PLT 165.0 08/15/2017 0949   PLT 185 03/07/2017 1112   MCV 89.6 08/15/2017 0949   MCV 91 03/07/2017 1112   MCH 31.1 03/07/2017 1112   MCH 31.6 (A) 10/08/2015 1050   MCHC 34.2 08/15/2017 0949   RDW 13.4 08/15/2017 0949   RDW 13.7 03/07/2017 1112   LYMPHSABS 2.0 08/15/2017 0949   LYMPHSABS 2.1 03/07/2017 1112   MONOABS 0.6 08/15/2017 0949   EOSABS 0.1 08/15/2017 0949   EOSABS 0.1 03/07/2017 1112   BASOSABS 0.1 08/15/2017 0949   BASOSABS 0.0 03/07/2017 1112   Records from his visit with my partner reviewed where he was started on Dulera and given prednisone taper.      Assessment & Plan:   No diagnosis found.  Discussion: ***   Current Outpatient Medications:  .  albuterol (PROVENTIL HFA;VENTOLIN HFA) 108 (90 Base) MCG/ACT inhaler, Inhale 2 puffs into the lungs every 6 (six) hours as needed for wheezing or shortness of breath., Disp: 1 Inhaler, Rfl: 0 .  budesonide-formoterol (SYMBICORT) 160-4.5 MCG/ACT inhaler, Inhale 2 puffs into the lungs every 12 (twelve) hours., Disp: 1 Inhaler, Rfl: 6 .  cyclobenzaprine (FLEXERIL) 10 MG tablet, Take 1 tablet (10 mg total) by mouth at bedtime. May take 2 additional times during day prn muscle spasms but will cause sedation, Disp: 60 tablet, Rfl: 0 .  fluticasone (FLONASE) 50 MCG/ACT nasal spray, Place 2 sprays into both nostrils at bedtime., Disp: 16 g, Rfl:  2 .  HYDROcodone-acetaminophen (NORCO) 10-325 MG tablet, Take 1 tablet by mouth every 6 (six) hours as needed., Disp: 20 tablet, Rfl: 0 .  mometasone-formoterol (DULERA) 200-5 MCG/ACT AERO, Inhale 2 puffs into the lungs 2 (two) times daily., Disp: 1 Inhaler, Rfl: 0 .  omeprazole (PRILOSEC) 20 MG capsule, Take 1 capsule (20 mg total) by mouth daily., Disp: 30 capsule, Rfl: 5 .  sildenafil (REVATIO) 20 MG tablet, Use as  instructed by physician. 1 to 3 tabs daily as needed, Disp: 60 tablet, Rfl: 5

## 2018-03-14 ENCOUNTER — Ambulatory Visit (INDEPENDENT_AMBULATORY_CARE_PROVIDER_SITE_OTHER): Payer: Self-pay | Admitting: Primary Care

## 2018-03-14 ENCOUNTER — Encounter: Payer: Self-pay | Admitting: Primary Care

## 2018-03-14 ENCOUNTER — Other Ambulatory Visit (INDEPENDENT_AMBULATORY_CARE_PROVIDER_SITE_OTHER): Payer: Self-pay

## 2018-03-14 VITALS — BP 110/78 | HR 70 | Temp 97.8°F | Ht 73.0 in | Wt 183.6 lb

## 2018-03-14 DIAGNOSIS — R42 Dizziness and giddiness: Secondary | ICD-10-CM | POA: Insufficient documentation

## 2018-03-14 DIAGNOSIS — J069 Acute upper respiratory infection, unspecified: Secondary | ICD-10-CM

## 2018-03-14 LAB — CBC WITH DIFFERENTIAL/PLATELET
BASOS PCT: 0.7 % (ref 0.0–3.0)
Basophils Absolute: 0.1 10*3/uL (ref 0.0–0.1)
EOS ABS: 0.3 10*3/uL (ref 0.0–0.7)
Eosinophils Relative: 2.8 % (ref 0.0–5.0)
HEMATOCRIT: 45 % (ref 39.0–52.0)
Hemoglobin: 15.1 g/dL (ref 13.0–17.0)
Lymphocytes Relative: 19.5 % (ref 12.0–46.0)
Lymphs Abs: 1.8 10*3/uL (ref 0.7–4.0)
MCHC: 33.6 g/dL (ref 30.0–36.0)
MCV: 90 fl (ref 78.0–100.0)
MONO ABS: 0.6 10*3/uL (ref 0.1–1.0)
Monocytes Relative: 7 % (ref 3.0–12.0)
NEUTROS ABS: 6.4 10*3/uL (ref 1.4–7.7)
Neutrophils Relative %: 70 % (ref 43.0–77.0)
PLATELETS: 167 10*3/uL (ref 150.0–400.0)
RBC: 4.99 Mil/uL (ref 4.22–5.81)
RDW: 13.3 % (ref 11.5–15.5)
WBC: 9.2 10*3/uL (ref 4.0–10.5)

## 2018-03-14 LAB — BASIC METABOLIC PANEL
BUN: 19 mg/dL (ref 6–23)
CHLORIDE: 101 meq/L (ref 96–112)
CO2: 33 meq/L — AB (ref 19–32)
CREATININE: 0.98 mg/dL (ref 0.40–1.50)
Calcium: 9.3 mg/dL (ref 8.4–10.5)
GFR: 84.81 mL/min (ref >60.00)
Glucose, Bld: 93 mg/dL (ref 70–99)
Potassium: 4 mEq/L (ref 3.5–5.1)
Sodium: 138 mEq/L (ref 135–145)

## 2018-03-14 MED ORDER — MECLIZINE HCL 12.5 MG PO TABS: 12.5000 mg | ORAL_TABLET | Freq: Three times a day (TID) | ORAL | 0 refills | Status: DC | PRN

## 2018-03-14 NOTE — Progress Notes (Signed)
@Patient  ID: Jerry Steele, male    DOB: 1966-07-10, 52 y.o.   MRN: [id number]  Chief Complaint  Patient presents with  . Acute Visit    cough clear, last 3 days left side neck pain, head ache, dizzy, no energy, feels like passing out for 3 days last week    Referring provider: Shawnee Knapp, MD  HPI: 52 year old male, former smoker. PMH cough variant asthma, hypersensitivity pneumonitis, cough. Patient of Dr. Lake Bells, last seen in July. Normal Chest CT in 2018.   03/14/2018 Complains of cough x 3 weeks. Cough is dry with some phlegm, thinks its clear. Feels he has lymphadenopathy on left side of neck. Associated balance issued/dizziness last week and HA. He is taking tesslon perles for cough with little improvement. Drinking pepsi helps, not coke.      No Known Allergies   There is no immunization history on file for this patient.  Past Medical History:  Diagnosis Date  . Pneumonia     Tobacco History: Social History   Tobacco Use  Smoking Status Former Smoker  . Packs/day: 0.25  . Years: 30.00  . Pack years: 7.50  . Types: Cigarettes  . Last attempt to quit: 04/17/2017  . Years since quitting: 0.9  Smokeless Tobacco Never Used   Counseling given: Not Answered   Outpatient Medications Prior to Visit  Medication Sig Dispense Refill  . albuterol (PROVENTIL HFA;VENTOLIN HFA) 108 (90 Base) MCG/ACT inhaler Inhale 2 puffs into the lungs every 6 (six) hours as needed for wheezing or shortness of breath. 1 Inhaler 0  . budesonide-formoterol (SYMBICORT) 160-4.5 MCG/ACT inhaler Inhale 2 puffs into the lungs every 12 (twelve) hours. 1 Inhaler 6  . cyclobenzaprine (FLEXERIL) 10 MG tablet Take 1 tablet (10 mg total) by mouth at bedtime. May take 2 additional times during day prn muscle spasms but will cause sedation 60 tablet 0  . fluticasone (FLONASE) 50 MCG/ACT nasal spray Place 2 sprays into both nostrils at bedtime. 16 g 2  . HYDROcodone-acetaminophen (NORCO)  10-325 MG tablet Take 1 tablet by mouth every 6 (six) hours as needed. 20 tablet 0  . mometasone-formoterol (DULERA) 200-5 MCG/ACT AERO Inhale 2 puffs into the lungs 2 (two) times daily. 1 Inhaler 0  . omeprazole (PRILOSEC) 20 MG capsule Take 1 capsule (20 mg total) by mouth daily. 30 capsule 5  . sildenafil (REVATIO) 20 MG tablet Use as instructed by physician. 1 to 3 tabs daily as needed 60 tablet 5   No facility-administered medications prior to visit.     Review of Systems  Review of Systems  Constitutional: Negative for fatigue and fever.  HENT: Negative.   Respiratory: Positive for cough.   Cardiovascular: Negative.   Gastrointestinal: Negative.   Neurological: Positive for dizziness and headaches.    Physical Exam  BP 110/78 (BP Location: Left Arm, Cuff Size: Normal)   Pulse 70   Temp 97.8 F (36.6 C)   Ht 6\' 1"  (1.854 m)   Wt 183 lb 9.6 oz (83.3 kg)   SpO2 97%   BMI 24.22 kg/m    Physical Exam  Constitutional: He is oriented to person, place, and time. He appears well-developed and well-nourished.  HENT:  Head: Normocephalic and atraumatic.  Eyes: Pupils are equal, round, and reactive to light. EOM are normal.  Neck: Normal range of motion. Neck supple.  Cardiovascular: Normal rate, regular rhythm, normal heart sounds and intact distal pulses.  Pulmonary/Chest: Effort normal and breath sounds normal. No  respiratory distress. He has no wheezes.  Abdominal: Soft. Bowel sounds are normal. There is no tenderness.  Neurological: He is alert and oriented to person, place, and time.  Skin: Skin is warm and dry. No rash noted. No erythema.  Psychiatric: He has a normal mood and affect. His behavior is normal. Judgment normal.      Lab Results:  CBC    Component Value Date/Time   WBC 9.2 03/14/2018 1112   RBC 4.99 03/14/2018 1112   HGB 15.1 03/14/2018 1112   HGB 16.1 03/07/2017 1112   HCT 45.0 03/14/2018 1112   HCT 47.0 03/07/2017 1112   PLT 167.0 03/14/2018  1112   PLT 185 03/07/2017 1112   MCV 90.0 03/14/2018 1112   MCV 91 03/07/2017 1112   MCH 31.1 03/07/2017 1112   MCH 31.6 (A) 10/08/2015 1050   MCHC 33.6 03/14/2018 1112   RDW 13.3 03/14/2018 1112   RDW 13.7 03/07/2017 1112   LYMPHSABS 1.8 03/14/2018 1112   LYMPHSABS 2.1 03/07/2017 1112   MONOABS 0.6 03/14/2018 1112   EOSABS 0.3 03/14/2018 1112   EOSABS 0.1 03/07/2017 1112   BASOSABS 0.1 03/14/2018 1112   BASOSABS 0.0 03/07/2017 1112    BMET    Component Value Date/Time   NA 138 03/14/2018 1112   NA 139 03/07/2017 1112   K 4.0 03/14/2018 1112   CL 101 03/14/2018 1112   CO2 33 (H) 03/14/2018 1112   GLUCOSE 93 03/14/2018 1112   BUN 19 03/14/2018 1112   BUN 19 03/07/2017 1112   CREATININE 0.98 03/14/2018 1112   CREATININE 0.88 06/13/2015 1236   CALCIUM 9.3 03/14/2018 1112   GFRNONAA 85 03/07/2017 1112   GFRNONAA >89 06/13/2015 1236   GFRAA 98 03/07/2017 1112   GFRAA >89 06/13/2015 1236    BNP No results found for: BNP  ProBNP No results found for: PROBNP  Imaging: No results found.   Assessment & Plan:   52 year old male with cough and vague constitutional complaints. Appears well, exam is benign. Neuros intact. Gait and strength are normal. CBC with diff and BMET normal. Symptomatic management with Delsym and mucinex BID. Meclazine as needed for dizziness. Encourage rest and fluids.   Spell of dizziness -Complains of dizziness last week -Physical exam and labs normal. Neuros intact.  -Meclizine prn. Encourage oral hydration  Viral upper respiratory illness Nonspecific URI complaints Exam and labs normal Encourage mucinex and delsym for cough Advise rest and oral hydration    Martyn Ehrich, NP 03/14/2018

## 2018-03-14 NOTE — Progress Notes (Signed)
Reviewed, agree 

## 2018-03-14 NOTE — Patient Instructions (Addendum)
Labs today  Take deslym cough syrup twice a day Mucinex twice a day for cough   Meclazine 12.5mg  every 8 hours prn dizziness   Stay well hydrated

## 2018-03-14 NOTE — Assessment & Plan Note (Signed)
-  Complains of dizziness last week -Physical exam and labs normal. Neuros intact.  -Meclizine prn. Encourage oral hydration

## 2018-03-14 NOTE — Assessment & Plan Note (Signed)
Nonspecific URI complaints Exam and labs normal Encourage mucinex and delsym for cough Advise rest and oral hydration

## 2018-04-03 ENCOUNTER — Encounter: Payer: Self-pay | Admitting: Family Medicine

## 2018-04-03 ENCOUNTER — Ambulatory Visit (INDEPENDENT_AMBULATORY_CARE_PROVIDER_SITE_OTHER): Payer: Self-pay | Admitting: Family Medicine

## 2018-04-03 ENCOUNTER — Other Ambulatory Visit: Payer: Self-pay

## 2018-04-03 VITALS — BP 114/76 | HR 71 | Temp 97.6°F | Ht 75.0 in | Wt 183.4 lb

## 2018-04-03 DIAGNOSIS — J069 Acute upper respiratory infection, unspecified: Secondary | ICD-10-CM

## 2018-04-03 DIAGNOSIS — R6889 Other general symptoms and signs: Secondary | ICD-10-CM

## 2018-04-03 DIAGNOSIS — R05 Cough: Secondary | ICD-10-CM

## 2018-04-03 DIAGNOSIS — J189 Pneumonia, unspecified organism: Secondary | ICD-10-CM

## 2018-04-03 DIAGNOSIS — R059 Cough, unspecified: Secondary | ICD-10-CM

## 2018-04-03 DIAGNOSIS — R0982 Postnasal drip: Secondary | ICD-10-CM

## 2018-04-03 DIAGNOSIS — R12 Heartburn: Secondary | ICD-10-CM

## 2018-04-03 LAB — POCT INFLUENZA A/B
INFLUENZA B, POC: NEGATIVE
Influenza A, POC: NEGATIVE

## 2018-04-03 MED ORDER — BENZONATATE 100 MG PO CAPS: 100.0000 mg | ORAL_CAPSULE | Freq: Three times a day (TID) | ORAL | 0 refills | Status: DC | PRN

## 2018-04-03 MED ORDER — OMEPRAZOLE MAGNESIUM 20 MG PO TBEC: 20.0000 mg | DELAYED_RELEASE_TABLET | Freq: Every day | ORAL | 2 refills | Status: DC

## 2018-04-03 NOTE — Patient Instructions (Addendum)
For cough variant asthma or chronic cough- make sure you take symbicort 2 puffs twice per day (you should not be taking Dulera). Albuterol if needed for coughing fits with shortness of breath or wheezing (up to 2 puffs every 6 hours as needed).  Take flonase nasal spray every day for now to help with cough and drainage.  Zyrtec once per day for allergies.  Take omeprazole once per day for possible heartburn cause of cough. I have sent in the tabs as you preferred.   Tessalon perles if needed for cough during day.   Drink plenty of fluids, rest for cold symptoms.   Try to avoid cleaning chemical exposure and wear mask.   Return to the clinic or go to the nearest emergency room if any of your symptoms worsen or new symptoms occur.   Upper Respiratory Infection, Adult Most upper respiratory infections (URIs) are caused by a virus. A URI affects the nose, throat, and upper air passages. The most common type of URI is often called "the common cold." Follow these instructions at home:  Take medicines only as told by your doctor.  Gargle warm saltwater or take cough drops to comfort your throat as told by your doctor.  Use a warm mist humidifier or inhale steam from a shower to increase air moisture. This may make it easier to breathe.  Drink enough fluid to keep your pee (urine) clear or pale yellow.  Eat soups and other clear broths.  Have a healthy diet.  Rest as needed.  Go back to work when your fever is gone or your doctor says it is okay. ? You may need to stay home longer to avoid giving your URI to others. ? You can also wear a face mask and wash your hands often to prevent spread of the virus.  Use your inhaler more if you have asthma.  Do not use any tobacco products, including cigarettes, chewing tobacco, or electronic cigarettes. If you need help quitting, ask your doctor. Contact a doctor if:  You are getting worse, not better.  Your symptoms are not helped by  medicine.  You have chills.  You are getting more short of breath.  You have brown or red mucus.  You have yellow or brown discharge from your nose.  You have pain in your face, especially when you bend forward.  You have a fever.  You have puffy (swollen) neck glands.  You have pain while swallowing.  You have white areas in the back of your throat. Get help right away if:  You have very bad or constant: ? Headache. ? Ear pain. ? Pain in your forehead, behind your eyes, and over your cheekbones (sinus pain). ? Chest pain.  You have long-lasting (chronic) lung disease and any of the following: ? Wheezing. ? Long-lasting cough. ? Coughing up blood. ? A change in your usual mucus.  You have a stiff neck.  You have changes in your: ? Vision. ? Hearing. ? Thinking. ? Mood. This information is not intended to replace advice given to you by your health care provider. Make sure you discuss any questions you have with your health care provider. Document Released: 12/20/2007 Document Revised: 03/05/2016 Document Reviewed: 10/08/2013 Elsevier Interactive Patient Education  2018 [place].  Cough, Adult Coughing is a reflex that clears your throat and your airways. Coughing helps to heal and protect your lungs. It is normal to cough occasionally, but a cough that happens with other symptoms or lasts  a long time may be a sign of a condition that needs treatment. A cough may last only 2-3 weeks (acute), or it may last longer than 8 weeks (chronic). What are the causes? Coughing is commonly caused by:  Breathing in substances that irritate your lungs.  A viral or bacterial respiratory infection.  Allergies.  Asthma.  Postnasal drip.  Smoking.  Acid backing up from the stomach into the esophagus (gastroesophageal reflux).  Certain medicines.  Chronic lung problems, including COPD (or rarely, lung cancer).  Other medical conditions such as heart  failure.  Follow these instructions at home: Pay attention to any changes in your symptoms. Take these actions to help with your discomfort:  Take medicines only as told by your health care provider. ? If you were prescribed an antibiotic medicine, take it as told by your health care provider. Do not stop taking the antibiotic even if you start to feel better. ? Talk with your health care provider before you take a cough suppressant medicine.  Drink enough fluid to keep your urine clear or pale yellow.  If the air is dry, use a cold steam vaporizer or humidifier in your bedroom or your home to help loosen secretions.  Avoid anything that causes you to cough at work or at home.  If your cough is worse at night, try sleeping in a semi-upright position.  Avoid cigarette smoke. If you smoke, quit smoking. If you need help quitting, ask your health care provider.  Avoid caffeine.  Avoid alcohol.  Rest as needed.  Contact a health care provider if:  You have new symptoms.  You cough up pus.  Your cough does not get better after 2-3 weeks, or your cough gets worse.  You cannot control your cough with suppressant medicines and you are losing sleep.  You develop pain that is getting worse or pain that is not controlled with pain medicines.  You have a fever.  You have unexplained weight loss.  You have night sweats. Get help right away if:  You cough up blood.  You have difficulty breathing.  Your heartbeat is very fast. This information is not intended to replace advice given to you by your health care provider. Make sure you discuss any questions you have with your health care provider. Document Released: 12/30/2010 Document Revised: 12/09/2015 Document Reviewed: 09/09/2014 Elsevier Interactive Patient Education  [employer].     If you have lab work done today you will be contacted with your lab results within the next 2 weeks.  If you have not heard from [place]  then please contact us. The fastest way to get your results is to register for My Chart.   IF you received an x-ray today, you will receive an invoice from [facility]. Please contact [facility] at [phone] with questions or concerns regarding your invoice.   IF you received labwork today, you will receive an invoice from [facility]. Please contact [facility] at [phone] with questions or concerns regarding your invoice.   Our billing staff will not be able to assist you with questions regarding bills from these companies.  You will be contacted with the lab results as soon as they are available. The fastest way to get your results is to activate your My Chart account. Instructions are located on the last page of this paperwork. If you have not heard from [place] regarding the results in 2 weeks, please contact this office.

## 2018-04-03 NOTE — Progress Notes (Signed)
Subjective:  By signing my name below, I, Jerry Steele, attest that this documentation has been prepared under the direction and in the presence of Jerry Agreste, MD Electronically Signed: Ladene Artist, ED Scribe 04/03/2018 at 10:52 AM.   Patient ID: Jerry Steele, male    DOB: [birth year], 52 y.o.   MRN: [id number]  Chief Complaint  Patient presents with  . dry Cough    3 month  . chest congestion    2 weeks   . Chills  . Night Sweats  . Fever   HPI Jerry Steele is a 52 y.o. male who presents to Primary Care at [facility] complaining of dry cough x 3 wks. Seen in the past with recurrent cough. Prev smoking hx 38 yrs, quit when seen in 04/2017. Cough x 4 months at that time. Continued throat irritation after z-pak, Protonix, tessalon. Most recently seen 8/29 by pulmonary. Cough x 3 wks with some dizziness and HA 1 wk prior. Tessalon perles with minimal improvement. Pepsi worked better [employer]. Recommended symptomatic care with delsym and mucinex, meclizine for dizziness. Prev seen in July, cough variant asthma. Prescribed Symbicort. Prev dulera cost prohibitive. Prev CT chest mild emphysema, PFT no air flow obstruction. Diagnosed with hypersensitivity pneumonitis. Recommended delsym, tessalon, zyrtec, Prilosec, flonase, mucinex, dulera and get rid of bird/pigeon in home.  Today, pt presents with dry cough, occasionally productive, with clear phlegm x 3 wks. Pt reports some heartburn, nasal congestion, chest congestion x 2 wks. He reports associated symptoms of subjective fever, chills, intermittent night sweats, HA, metal taste in his mouth, postnasal drip, rhinorrhea x 2 days. Also reports some sob and dizziness only with coughing spells. He no longer has the pigeon in his home. States he was cleaning his home with bleach 2 days ago which he thinks may have triggered symptoms as well. Pt has been using Symbicort almost daily. He only takes flonase when his nose is dry.  Pt has not taken prilosec (states he prefers tablet instead of capsule), zyrtec, mucinex, tessalon within 2-3 wks.  Patient Active Problem List   Diagnosis Date Noted  . Spell of dizziness 03/14/2018  . Viral upper respiratory illness 03/14/2018  . Cough variant asthma 11/27/2017  . Hypersensitivity pneumonitis ([place]) 09/06/2017  . Cough 06/27/2017  . Rib pain 06/27/2017  . Tobacco use disorder 05/16/2017   Past Medical History:  Diagnosis Date  . Pneumonia    No past surgical history on file. No Known Allergies Prior to Admission medications   Medication Sig Start Date End Date Taking? Authorizing Provider  albuterol (PROVENTIL HFA;VENTOLIN HFA) 108 (90 Base) MCG/ACT inhaler Inhale 2 puffs into the lungs every 6 (six) hours as needed for wheezing or shortness of breath. 02/01/18   Martyn Ehrich, NP  budesonide-formoterol [facility]) 160-4.5 MCG/ACT inhaler Inhale 2 puffs into the lungs every 12 (twelve) hours. 02/01/18   Martyn Ehrich, NP  cyclobenzaprine (FLEXERIL) 10 MG tablet Take 1 tablet (10 mg total) by mouth at bedtime. May take 2 additional times during day prn muscle spasms but will cause sedation 10/11/17   Shawnee Knapp, MD  fluticasone [facility]) 50 MCG/ACT nasal spray Place 2 sprays into both nostrils at bedtime. 09/22/17   Rutherford Guys, MD  HYDROcodone-acetaminophen [facility] 10-325 MG tablet Take 1 tablet by mouth every 6 (six) hours as needed. 10/11/17   Shawnee Knapp, MD  meclizine (ANTIVERT) 12.5 MG tablet Take 1 tablet (12.5 mg total) by mouth 3 (three) times daily as  needed for dizziness. 03/14/18   Martyn Ehrich, NP  mometasone-formoterol (DULERA) 200-5 MCG/ACT AERO Inhale 2 puffs into the lungs 2 (two) times daily. 08/15/17   Juanito Doom, MD  omeprazole (PRILOSEC) 20 MG capsule Take 1 capsule (20 mg total) by mouth daily. 11/27/17   Juanito Doom, MD  sildenafil (REVATIO) 20 MG tablet Use as instructed by physician. 1 to 3 tabs daily as needed 04/09/17    Shawnee Knapp, MD   Social History   Socioeconomic History  . Marital status: Single    Spouse name: Not on file  . Number of children: Not on file  . Years of education: Not on file  . Highest education level: Not on file  Occupational History  . Not on file  Social Needs  . Financial resource strain: Not on file  . Food insecurity:    Worry: Not on file    Inability: Not on file  . Transportation needs:    Medical: Not on file    Non-medical: Not on file  Tobacco Use  . Smoking status: Former Smoker    Packs/day: 0.25    Years: 30.00    Pack years: 7.50    Types: Cigarettes    Last attempt to quit: 04/17/2017    Years since quitting: 0.9  . Smokeless tobacco: Never Used  Substance and Sexual Activity  . Alcohol use: No    Alcohol/week: 0.0 standard drinks  . Drug use: No  . Sexual activity: Not on file  Lifestyle  . Physical activity:    Days per week: Not on file    Minutes per session: Not on file  . Stress: Not on file  Relationships  . Social connections:    Talks on phone: Not on file    Gets together: Not on file    Attends religious service: Not on file    Active member of club or organization: Not on file    Attends meetings of clubs or organizations: Not on file    Relationship status: Not on file  . Intimate partner violence:    Fear of current or ex partner: Not on file    Emotionally abused: Not on file    Physically abused: Not on file    Forced sexual activity: Not on file  Other Topics Concern  . Not on file  Social History Narrative  . Not on file   Review of Systems  Constitutional: Positive for chills, diaphoresis and fever (subjective).  HENT: Positive for congestion, postnasal drip and rhinorrhea.   Respiratory: Positive for cough and shortness of breath (with coughing spells).   Neurological: Positive for dizziness and headaches.      Objective:   Physical Exam  Constitutional: He is oriented to person, place, and time. He appears  well-developed and well-nourished.  HENT:  Head: Normocephalic and atraumatic.  Right Ear: Tympanic membrane, external ear and ear canal normal.  Left Ear: Tympanic membrane, external ear and ear canal normal.  Nose: No rhinorrhea. Right sinus exhibits no maxillary sinus tenderness and no frontal sinus tenderness. Left sinus exhibits no maxillary sinus tenderness and no frontal sinus tenderness.  Mouth/Throat: Oropharynx is clear and moist and mucous membranes are normal. No oropharyngeal exudate or posterior oropharyngeal erythema.  Eyes: Pupils are equal, round, and reactive to light. Conjunctivae are normal.  Neck: Neck supple.  Cardiovascular: Normal rate, regular rhythm, normal heart sounds and intact distal pulses.  No murmur heard. Pulmonary/Chest: Effort normal and breath  sounds normal. He has no wheezes. He has no rhonchi. He has no rales.  Lungs are clear.  Abdominal: Soft. There is no tenderness.  Lymphadenopathy:    He has no cervical adenopathy.  Neurological: He is alert and oriented to person, place, and time.  Skin: Skin is warm and dry. No rash noted.  Psychiatric: He has a normal mood and affect. His behavior is normal.  Vitals reviewed.  Vitals:   04/03/18 1012  BP: 114/76  Pulse: 71  Temp: 97.6 F (36.4 C)  TempSrc: Oral  SpO2: 96%  Weight: 183 lb 6.4 oz (83.2 kg)  Height: 6\' 3"  (1.905 m)   Results for orders placed or performed in visit on 04/03/18  POCT Influenza A/B  Result Value Ref Range   Influenza A, POC Negative Negative   Influenza B, POC Negative Negative      Assessment & Plan:  Reshawn Ostlund is a 52 y.o. male Cough - Plan: omeprazole (PRILOSEC OTC) 20 MG tablet, benzonatate (TESSALON) 100 MG capsule Flu-like symptoms - Plan: POCT Influenza A/B Acute upper respiratory infection - Plan: benzonatate (TESSALON) 100 MG capsule Heartburn - Plan: omeprazole (PRILOSEC OTC) 20 MG tablet PND (post-nasal drip) Pneumonitis  Suspected  chronic cough, with exercise-induced asthma versus pneumonitis versus laryngeal pharyngeal reflux with secondary illness from possible upper respiratory infection/virus versus combination of chemical pneumonitis from cleaning.  Lungs clear, vital signs reassuring.  -Stressed importance of Symbicort 2 puffs twice daily consistent use for cough variant asthma  -Stressed importance of Flonase daily to lessen postnasal drip as contributor to upper airway cough, and can add Zyrtec daily  -Restart proton pump inhibitor, prefers omeprazole tabs, for possible laryngeal pharyngeal reflux cough  -Symptomatic care discussed for likely current viral infection with Tessalon Perles, fluids, rest.  -Albuterol as needed, including for possible flare of cough from recent cleaning chemical as possible irritant.  Mask/avoidance maneuvers discussed.   -RTC/ER precautions if worsening. Meds ordered this encounter  Medications  . omeprazole (PRILOSEC OTC) 20 MG tablet    Sig: Take 1 tablet (20 mg total) by mouth daily.    Dispense:  30 tablet    Refill:  2  . benzonatate (TESSALON) 100 MG capsule    Sig: Take 1 capsule (100 mg total) by mouth 3 (three) times daily as needed for cough.    Dispense:  20 capsule    Refill:  0   Patient Instructions   For cough variant asthma or chronic cough- make sure you take symbicort 2 puffs twice per day (you should not be taking Dulera). Albuterol if needed for coughing fits with shortness of breath or wheezing (up to 2 puffs every 6 hours as needed).  Take flonase nasal spray every day for now to help with cough and drainage.  Zyrtec once per day for allergies.  Take omeprazole once per day for possible heartburn cause of cough. I have sent in the tabs as you preferred.   Tessalon perles if needed for cough during day.   Drink plenty of fluids, rest for cold symptoms.   Try to avoid cleaning chemical exposure and wear mask.   Return to the clinic or go to the nearest  emergency room if any of your symptoms worsen or new symptoms occur.   Upper Respiratory Infection, Adult Most upper respiratory infections (URIs) are caused by a virus. A URI affects the nose, throat, and upper air passages. The most common type of URI is often called "the common cold." Follow  these instructions at home:  Take medicines only as told by your doctor.  Gargle warm saltwater or take cough drops to comfort your throat as told by your doctor.  Use a warm mist humidifier or inhale steam from a shower to increase air moisture. This may make it easier to breathe.  Drink enough fluid to keep your pee (urine) clear or pale yellow.  Eat soups and other clear broths.  Have a healthy diet.  Rest as needed.  Go back to work when your fever is gone or your doctor says it is okay. ? You may need to stay home longer to avoid giving your URI to others. ? You can also wear a face mask and wash your hands often to prevent spread of the virus.  Use your inhaler more if you have asthma.  Do not use any tobacco products, including cigarettes, chewing tobacco, or electronic cigarettes. If you need help quitting, ask your doctor. Contact a doctor if:  You are getting worse, not better.  Your symptoms are not helped by medicine.  You have chills.  You are getting more short of breath.  You have brown or red mucus.  You have yellow or brown discharge from your nose.  You have pain in your face, especially when you bend forward.  You have a fever.  You have puffy (swollen) neck glands.  You have pain while swallowing.  You have white areas in the back of your throat. Get help right away if:  You have very bad or constant: ? Headache. ? Ear pain. ? Pain in your forehead, behind your eyes, and over your cheekbones (sinus pain). ? Chest pain.  You have long-lasting (chronic) lung disease and any of the following: ? Wheezing. ? Long-lasting cough. ? Coughing up  blood. ? A change in your usual mucus.  You have a stiff neck.  You have changes in your: ? Vision. ? Hearing. ? Thinking. ? Mood. This information is not intended to replace advice given to you by your health care provider. Make sure you discuss any questions you have with your health care provider. Document Released: 12/20/2007 Document Revised: 03/05/2016 Document Reviewed: 10/08/2013 Elsevier Interactive Patient Education  2018 [employer].  Cough, Adult Coughing is a reflex that clears your throat and your airways. Coughing helps to heal and protect your lungs. It is normal to cough occasionally, but a cough that happens with other symptoms or lasts a long time may be a sign of a condition that needs treatment. A cough may last only 2-3 weeks (acute), or it may last longer than 8 weeks (chronic). What are the causes? Coughing is commonly caused by:  Breathing in substances that irritate your lungs.  A viral or bacterial respiratory infection.  Allergies.  Asthma.  Postnasal drip.  Smoking.  Acid backing up from the stomach into the esophagus (gastroesophageal reflux).  Certain medicines.  Chronic lung problems, including COPD (or rarely, lung cancer).  Other medical conditions such as heart failure.  Follow these instructions at home: Pay attention to any changes in your symptoms. Take these actions to help with your discomfort:  Take medicines only as told by your health care provider. ? If you were prescribed an antibiotic medicine, take it as told by your health care provider. Do not stop taking the antibiotic even if you start to feel better. ? Talk with your health care provider before you take a cough suppressant medicine.  Drink enough fluid to keep your  urine clear or pale yellow.  If the air is dry, use a cold steam vaporizer or humidifier in your bedroom or your home to help loosen secretions.  Avoid anything that causes you to cough at work or at  home.  If your cough is worse at night, try sleeping in a semi-upright position.  Avoid cigarette smoke. If you smoke, quit smoking. If you need help quitting, ask your health care provider.  Avoid caffeine.  Avoid alcohol.  Rest as needed.  Contact a health care provider if:  You have new symptoms.  You cough up pus.  Your cough does not get better after 2-3 weeks, or your cough gets worse.  You cannot control your cough with suppressant medicines and you are losing sleep.  You develop pain that is getting worse or pain that is not controlled with pain medicines.  You have a fever.  You have unexplained weight loss.  You have night sweats. Get help right away if:  You cough up blood.  You have difficulty breathing.  Your heartbeat is very fast. This information is not intended to replace advice given to you by your health care provider. Make sure you discuss any questions you have with your health care provider. Document Released: 12/30/2010 Document Revised: 12/09/2015 Document Reviewed: 09/09/2014 Elsevier Interactive Patient Education  [employer].     If you have lab work done today you will be contacted with your lab results within the next 2 weeks.  If you have not heard from [place] then please contact us. The fastest way to get your results is to register for My Chart.   IF you received an x-ray today, you will receive an invoice from [facility]. Please contact [facility] at [phone] with questions or concerns regarding your invoice.   IF you received labwork today, you will receive an invoice from [place]. Please contact [facility] at [phone] with questions or concerns regarding your invoice.   Our billing staff will not be able to assist you with questions regarding bills from these companies.  You will be contacted with the lab results as soon as they are available. The fastest way to get your results is to activate your My  Chart account. Instructions are located on the last page of this paperwork. If you have not heard from [place] regarding the results in 2 weeks, please contact this office.       I personally performed the services described in this documentation, which was scribed in my presence. The recorded information has been reviewed and considered for accuracy and completeness, addended by me as needed, and agree with information above.  Signed,   Merri Ray, MD Primary [facility] at [place].  04/03/18 1:20 PM

## 2018-04-11 ENCOUNTER — Ambulatory Visit (INDEPENDENT_AMBULATORY_CARE_PROVIDER_SITE_OTHER): Payer: Self-pay | Admitting: Pulmonary Disease

## 2018-04-11 ENCOUNTER — Encounter: Payer: Self-pay | Admitting: Pulmonary Disease

## 2018-04-11 VITALS — BP 126/74 | HR 88 | Ht 73.0 in | Wt 183.0 lb

## 2018-04-11 DIAGNOSIS — J679 Hypersensitivity pneumonitis due to unspecified organic dust: Secondary | ICD-10-CM

## 2018-04-11 DIAGNOSIS — J45991 Cough variant asthma: Secondary | ICD-10-CM

## 2018-04-11 DIAGNOSIS — J301 Allergic rhinitis due to pollen: Secondary | ICD-10-CM

## 2018-04-11 DIAGNOSIS — J069 Acute upper respiratory infection, unspecified: Secondary | ICD-10-CM

## 2018-04-11 DIAGNOSIS — K219 Gastro-esophageal reflux disease without esophagitis: Secondary | ICD-10-CM

## 2018-04-11 MED ORDER — MONTELUKAST SODIUM 10 MG PO TABS: 10.0000 mg | ORAL_TABLET | Freq: Every day | ORAL | 1 refills | Status: DC

## 2018-04-11 MED ORDER — PREDNISONE 10 MG PO TABS: ORAL_TABLET | ORAL | 0 refills | Status: DC

## 2018-04-11 MED ORDER — BENZONATATE 200 MG PO CAPS: 200.0000 mg | ORAL_CAPSULE | Freq: Three times a day (TID) | ORAL | 1 refills | Status: DC | PRN

## 2018-04-11 MED ORDER — GUAIFENESIN-CODEINE 100-10 MG/5ML PO SYRP: 5.0000 mL | ORAL_SOLUTION | Freq: Three times a day (TID) | ORAL | 0 refills | Status: DC | PRN

## 2018-04-11 NOTE — Progress Notes (Signed)
Subjective:    Patient ID: Jerry Steele, male    DOB: Mar 22, 1966, 52 y.o.   MRN: [id number]  Synopsis: referred in 2018 for evaluation of cough.  He is a smoker and lung function testing showed no evidence of airflow obstruction but a CT scan did show very mild emphysema  HPI Chief Complaint  Patient presents with  . Follow-up    Pt has dry cough, chest tightness, SOB with exertion, dizzy, and very fatigued. Pt has post nosal drip, sinus pressure, and not feeling well in general.   Sahib ran out of his medicines.  He says he feels "very very bad".  He says that when he walks he feels "drunk".  He says his chest is congestion, he has a lot of dry cough.  He is drinking a lot of water to help clear the mucus.  He says he can't get it out of his chest.  He says his breahting is "down" compared to prior.  He says that he has a lot of body aches, sinus congestion.  His mucus production is constant most days.  He has been using vicks vapo rub.  His energy is down.  He says his house is old and has a lot of mold mildew in it.  He say it is like "dracula's house".  He says that the ceiling is full of mold.    Past Medical History:  Diagnosis Date  . Pneumonia       Review of Systems  Constitutional: Negative for fever and unexpected weight change.  HENT: Positive for congestion. Negative for dental problem, ear pain, nosebleeds, postnasal drip, rhinorrhea, sinus pressure, sneezing, sore throat and trouble swallowing.   Eyes: Negative for redness and itching.  Respiratory: Positive for cough and shortness of breath. Negative for chest tightness and wheezing.   Cardiovascular: Negative for palpitations and leg swelling.  Gastrointestinal: Negative for nausea and vomiting.  Genitourinary: Negative for dysuria.  Musculoskeletal: Negative for joint swelling.  Skin: Negative for rash.  Neurological: Negative for headaches.  Hematological: Does not bruise/bleed easily.    Psychiatric/Behavioral: Negative for dysphoric mood. The patient is not nervous/anxious.        Objective:   Physical Exam Vitals:   04/11/18 1441  BP: 126/74  Pulse: 88  SpO2: 100%  Weight: 183 lb (83 kg)  Height: 6\' 1"  (1.854 m)    Gen: frequent coughing, appearing HENT: OP clear, TM's clear, neck supple PULM: CTA B, normal percussion CV: RRR, no mgr, trace edema GI: BS+, soft, nontender Derm: no cyanosis or rash Psyche: normal mood and affect   Labs: Candida, Setomelanoma, Aureodasaid, Phoma IgE normal  Chest imaging: 03/2017 CXR reviewed, some bronchitis changes but otherwise normal, personally reviewed October 2018 CT chest: Normal pulmonary parenchyma, no evidence of lesion or mass, images independently reviewed  Spirometry: October 2018: Ratio 77%, FVC decreased, 53% predicted In November 2018 ratio 76%, FEV1 2.90 L 66% predicted, FVC 3.81 L 67% predicted, total lung capacity 5.84 L 75% predicted, DLCO 25.32 mL 67% predicted  Exhaled NO: 07/2017 on Dulera 11ppm  CBC    Component Value Date/Time   WBC 9.2 03/14/2018 1112   RBC 4.99 03/14/2018 1112   HGB 15.1 03/14/2018 1112   HGB 16.1 03/07/2017 1112   HCT 45.0 03/14/2018 1112   HCT 47.0 03/07/2017 1112   PLT 167.0 03/14/2018 1112   PLT 185 03/07/2017 1112   MCV 90.0 03/14/2018 1112   MCV 91 03/07/2017 1112   MCH 31.1  03/07/2017 1112   MCH 31.6 (A) 10/08/2015 1050   MCHC 33.6 03/14/2018 1112   RDW 13.3 03/14/2018 1112   RDW 13.7 03/07/2017 1112   LYMPHSABS 1.8 03/14/2018 1112   LYMPHSABS 2.1 03/07/2017 1112   MONOABS 0.6 03/14/2018 1112   EOSABS 0.3 03/14/2018 1112   EOSABS 0.1 03/07/2017 1112   BASOSABS 0.1 03/14/2018 1112   BASOSABS 0.0 03/07/2017 1112   Records from his visit with my partner reviewed where he was started on Dulera and given prednisone taper.      Assessment & Plan:   Viral upper respiratory illness  Cough variant asthma  Hypersensitivity pneumonitis  (HCC)  Gastroesophageal reflux disease, esophagitis presence not specified  Allergic rhinitis due to pollen, unspecified seasonality  Discussion: Aragon has been somewhat non-compliant with his medications lately and it sounds as if the mold in his home is making his allergic rhinitis worse.  He has allergic rhinitis, gastroesophageal reflux disease, and emphysema which all are exacerbated by viral infections and his allergies.  When he is not compliant with his medications and his symptoms worsen.  He swears to me that he has not touched a cigarette since the last time I asked.  Plan: Gastroesophageal reflux disease: Take omeprazole daily, you can purchase this from [employer]  Allergic rhinitis: Take Singulair 1 pill daily no matter how you feel Take cetirizine 10 mg daily no matter how you feel, you can purchase this medicine from [employer] Take fluticasone nose spray 2 sprays each nostril every day no matter how you feel, you can purchase this medicine from [employer] Saline sprays 2 sprays each nostril 2-3 times a day, you can purchase this medicine from [employer]  Cough: Take Tessalon 200 mg 3 times a day as needed for cough Take the codeine cough syrup 2-3 times a day as needed for cough  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  albuterol (PROVENTIL HFA;VENTOLIN HFA) 108 (90 Base) MCG/ACT inhaler, Inhale 2 puffs into the lungs every 6 (six) hours as needed for wheezing or shortness of breath., Disp: 1 Inhaler, Rfl: 0 .  budesonide-formoterol (SYMBICORT) 160-4.5 MCG/ACT inhaler, Inhale 2 puffs into the lungs every 12 (twelve) hours., Disp: 1  Inhaler, Rfl: 6 .  cyclobenzaprine (FLEXERIL) 10 MG tablet, Take 1 tablet (10 mg total) by mouth at bedtime. May take 2 additional times during day prn muscle spasms but will cause sedation, Disp: 60 tablet, Rfl: 0 .  fluticasone (FLONASE) 50 MCG/ACT nasal spray, Place 2 sprays into both nostrils at bedtime., Disp: 16 g, Rfl: 2 .  omeprazole (PRILOSEC OTC) 20 MG tablet, Take 1 tablet (20 mg total) by mouth daily., Disp: 30 tablet, Rfl: 2 .  sildenafil (REVATIO) 20 MG tablet, Use as instructed by physician. 1 to 3 tabs daily as needed, Disp: 60 tablet, Rfl: 5 .  benzonatate (TESSALON) 200 MG capsule, Take 1 capsule (200 mg total) by mouth 3 (three) times daily as needed for cough., Disp: 45 capsule, Rfl: 1 .  guaiFENesin-codeine (ROBITUSSIN AC) 100-10 MG/5ML syrup, Take 5 mLs by mouth 3 (three) times daily as needed for cough., Disp:  120 mL, Rfl: 0 .  montelukast (SINGULAIR) 10 MG tablet, Take 1 tablet (10 mg total) by mouth at bedtime., Disp: 30 tablet, Rfl: 1 .  predniSONE (DELTASONE) 10 MG tablet, Take 40mg  po daily for 3 days, then take 30mg  po daily for 3 days, then take 20mg  po daily for two days, then take 10mg  po daily for 2 days, Disp: 27 tablet, Rfl: 0

## 2018-04-11 NOTE — Patient Instructions (Signed)
Gastroesophageal reflux disease: Take omeprazole daily, you can purchase this from [employer]  Allergic rhinitis: Take Singulair 1 pill daily no matter how you feel Take cetirizine 10 mg daily no matter how you feel, you can purchase this medicine from [employer] Take fluticasone nose spray 2 sprays each nostril every day no matter how you feel, you can purchase this medicine from [employer] Saline sprays 2 sprays each nostril 2-3 times a day, you can purchase this medicine from [employer]  Cough: Take Tessalon 200 mg 3 times a day as needed for cough Take the codeine cough syrup 2-3 times a day as needed for cough  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

## 2018-04-12 ENCOUNTER — Telehealth: Payer: Self-pay | Admitting: Pulmonary Disease

## 2018-04-12 MED ORDER — GUAIFENESIN-CODEINE 100-10 MG/5ML PO SYRP: 5.0000 mL | ORAL_SOLUTION | Freq: Three times a day (TID) | ORAL | 0 refills | Status: DC | PRN

## 2018-04-12 NOTE — Telephone Encounter (Signed)
Spoke with pt. He states that he was supposed to get a cough syrup prescription yesterday. This was not given to him. This prescription has been reprinted and signed by Dr. Lake Bells. Prescription has been placed up front for pick up. Pt is aware. Nothing further was needed.

## 2018-04-28 ENCOUNTER — Other Ambulatory Visit: Payer: Self-pay | Admitting: Family Medicine

## 2018-04-29 NOTE — Telephone Encounter (Signed)
Requested medication (s) are due for refill today: Yes  Requested medication (s) are on the active medication list: Yes  Last refill:  04/09/17  Future visit scheduled: No  Notes to clinic:  Unable to refill per protocol, expired rx     Requested Prescriptions  Pending Prescriptions Disp Refills   sildenafil (REVATIO) 20 MG tablet [Pharmacy Med Name: SILDENAFIL 20MG  TAB] 30 tablet 11    Sig: TAKE 1-3 TABLETS BY MOUTH ONCE DAILY AS NEEDED     Urology: Erectile Dysfunction Agents Passed - 04/28/2018  1:30 PM      Passed - Last BP in normal range    BP Readings from Last 1 Encounters:  04/11/18 126/74         Passed - Valid encounter within last 12 months    Recent Outpatient Visits          3 weeks ago Cough   [facility], Ranell Patrick, MD   6 months ago Acute bilateral low back pain without sciatica   [facility], Laurey Arrow, MD   7 months ago Acute non-recurrent maxillary sinusitis   [facility], Lilia Argue, MD   11 months ago Chronic cough   [facility], [facility]   1 year ago Chronic cough   [facility], Laurey Arrow, MD      Future Appointments            In 3 days Juanito Doom, MD [facility]

## 2018-04-30 NOTE — Telephone Encounter (Signed)
Patient is requesting a refill of the following medications: Requested Prescriptions   Pending Prescriptions Disp Refills  . sildenafil (REVATIO) 20 MG tablet [Pharmacy Med Name: SILDENAFIL 20MG  TAB] 30 tablet 11    Sig: TAKE 1-3 TABLETS BY MOUTH ONCE DAILY AS NEEDED    Date of patient request: 04/28/2018 Last office visit: 04/03/2018 Date of last refill: 04/09/2018 Last refill amount: 60 Follow up time period per chart:

## 2018-05-02 ENCOUNTER — Ambulatory Visit: Payer: Self-pay | Admitting: Pulmonary Disease

## 2018-05-02 NOTE — Progress Notes (Deleted)
Subjective:    Patient ID: Jerry Steele, male    DOB: 11/25/1965, 52 y.o.   MRN: [id number]  Synopsis: referred in 2018 for evaluation of cough.  He is a smoker and lung function testing showed no evidence of airflow obstruction but a CT scan did show very mild emphysema  HPI No chief complaint on file.  ***  Past Medical History:  Diagnosis Date  . Pneumonia       Review of Systems  Constitutional: Negative for fever and unexpected weight change.  HENT: Positive for congestion. Negative for dental problem, ear pain, nosebleeds, postnasal drip, rhinorrhea, sinus pressure, sneezing, sore throat and trouble swallowing.   Eyes: Negative for redness and itching.  Respiratory: Positive for cough and shortness of breath. Negative for chest tightness and wheezing.   Cardiovascular: Negative for palpitations and leg swelling.  Gastrointestinal: Negative for nausea and vomiting.  Genitourinary: Negative for dysuria.  Musculoskeletal: Negative for joint swelling.  Skin: Negative for rash.  Neurological: Negative for headaches.  Hematological: Does not bruise/bleed easily.  Psychiatric/Behavioral: Negative for dysphoric mood. The patient is not nervous/anxious.        Objective:   Physical Exam There were no vitals filed for this visit.  Gen: frequent coughing, appearing HENT: OP clear, TM's clear, neck supple PULM: CTA B, normal percussion CV: RRR, no mgr, trace edema GI: BS+, soft, nontender Derm: no cyanosis or rash Psyche: normal mood and affect   Labs: Candida, Setomelanoma, Aureodasaid, Phoma IgE normal  Chest imaging: 03/2017 CXR reviewed, some bronchitis changes but otherwise normal, personally reviewed October 2018 CT chest: mild centrilobular emphysema, no evidence of lesion or mass, images independently reviewed  Spirometry: October 2018: Ratio 77%, FVC decreased, 53% predicted In November 2018 ratio 76%, FEV1 2.90 L 66% predicted, FVC 3.81 L 67%  predicted, total lung capacity 5.84 L 75% predicted, DLCO 25.32 mL 67% predicted  Exhaled NO: 07/2017 on Dulera 11ppm  CBC    Component Value Date/Time   WBC 9.2 03/14/2018 1112   RBC 4.99 03/14/2018 1112   HGB 15.1 03/14/2018 1112   HGB 16.1 03/07/2017 1112   HCT 45.0 03/14/2018 1112   HCT 47.0 03/07/2017 1112   PLT 167.0 03/14/2018 1112   PLT 185 03/07/2017 1112   MCV 90.0 03/14/2018 1112   MCV 91 03/07/2017 1112   MCH 31.1 03/07/2017 1112   MCH 31.6 (A) 10/08/2015 1050   MCHC 33.6 03/14/2018 1112   RDW 13.3 03/14/2018 1112   RDW 13.7 03/07/2017 1112   LYMPHSABS 1.8 03/14/2018 1112   LYMPHSABS 2.1 03/07/2017 1112   MONOABS 0.6 03/14/2018 1112   EOSABS 0.3 03/14/2018 1112   EOSABS 0.1 03/07/2017 1112   BASOSABS 0.1 03/14/2018 1112   BASOSABS 0.0 03/07/2017 1112       Assessment & Plan:   No diagnosis found.  Discussion: ***     Current Outpatient Medications:  .  albuterol (PROVENTIL HFA;VENTOLIN HFA) 108 (90 Base) MCG/ACT inhaler, Inhale 2 puffs into the lungs every 6 (six) hours as needed for wheezing or shortness of breath., Disp: 1 Inhaler, Rfl: 0 .  benzonatate (TESSALON) 200 MG capsule, Take 1 capsule (200 mg total) by mouth 3 (three) times daily as needed for cough., Disp: 45 capsule, Rfl: 1 .  budesonide-formoterol (SYMBICORT) 160-4.5 MCG/ACT inhaler, Inhale 2 puffs into the lungs every 12 (twelve) hours., Disp: 1 Inhaler, Rfl: 6 .  cyclobenzaprine (FLEXERIL) 10 MG tablet, Take 1 tablet (10 mg total) by mouth at bedtime. May  take 2 additional times during day prn muscle spasms but will cause sedation, Disp: 60 tablet, Rfl: 0 .  fluticasone (FLONASE) 50 MCG/ACT nasal spray, Place 2 sprays into both nostrils at bedtime., Disp: 16 g, Rfl: 2 .  guaiFENesin-codeine (ROBITUSSIN AC) 100-10 MG/5ML syrup, Take 5 mLs by mouth 3 (three) times daily as needed for cough., Disp: 120 mL, Rfl: 0 .  montelukast (SINGULAIR) 10 MG tablet, Take 1 tablet (10 mg total) by mouth at  bedtime., Disp: 30 tablet, Rfl: 1 .  omeprazole (PRILOSEC OTC) 20 MG tablet, Take 1 tablet (20 mg total) by mouth daily., Disp: 30 tablet, Rfl: 2 .  predniSONE (DELTASONE) 10 MG tablet, Take 40mg  po daily for 3 days, then take 30mg  po daily for 3 days, then take 20mg  po daily for two days, then take 10mg  po daily for 2 days, Disp: 27 tablet, Rfl: 0 .  sildenafil (REVATIO) 20 MG tablet, Use as instructed by physician. 1 to 3 tabs daily as needed, Disp: 60 tablet, Rfl: 5

## 2018-07-06 ENCOUNTER — Telehealth: Payer: Self-pay

## 2018-07-06 ENCOUNTER — Encounter: Payer: Self-pay | Admitting: Family Medicine

## 2018-07-06 ENCOUNTER — Ambulatory Visit: Payer: Self-pay | Admitting: Family Medicine

## 2018-07-06 ENCOUNTER — Ambulatory Visit (INDEPENDENT_AMBULATORY_CARE_PROVIDER_SITE_OTHER): Payer: Self-pay

## 2018-07-06 ENCOUNTER — Other Ambulatory Visit: Payer: Self-pay

## 2018-07-06 VITALS — BP 131/82 | HR 73 | Temp 97.8°F | Ht 73.0 in | Wt 186.2 lb

## 2018-07-06 DIAGNOSIS — J209 Acute bronchitis, unspecified: Secondary | ICD-10-CM

## 2018-07-06 DIAGNOSIS — J42 Unspecified chronic bronchitis: Secondary | ICD-10-CM

## 2018-07-06 DIAGNOSIS — R0981 Nasal congestion: Secondary | ICD-10-CM

## 2018-07-06 MED ORDER — BROMPHENIRAMINE-PSEUDOEPH 1-15 MG/5ML PO ELIX: 10.0000 mL | ORAL_SOLUTION | Freq: Four times a day (QID) | ORAL | 0 refills | Status: DC | PRN

## 2018-07-06 MED ORDER — PREDNISONE 20 MG PO TABS: 40.0000 mg | ORAL_TABLET | Freq: Every day | ORAL | 0 refills | Status: AC

## 2018-07-06 MED ORDER — IPRATROPIUM BROMIDE 0.03 % NA SOLN: 2.0000 | Freq: Two times a day (BID) | NASAL | 0 refills | Status: DC

## 2018-07-06 MED ORDER — AZITHROMYCIN 250 MG PO TABS: ORAL_TABLET | ORAL | 0 refills | Status: DC

## 2018-07-06 NOTE — Progress Notes (Signed)
Patient ID: Jerry Steele, male    DOB: Nov 30, 1965, 52 y.o.   MRN: [id number]  PCP: Shawnee Knapp, MD  Chief Complaint  Patient presents with  . Cough    for 2 wks now, pt says he has emphysema. Hard to breathe due to all the coughing. Coughing is not productive, just consistant  . Nasal Congestion    Subjective:  HPI Jerry Steele is a 52 y.o. male presents for evaluation of chest congestion and worsening cough.  Patient states that he has a history of emphysema and it is documented that he has a history of asthma coughing rent.  He reports that he is followed by pulmonology and has recently been started on Symbicort and takes [employer] regularly.  Over the course of the last 2 weeks he has had a worsening nonproductive persistent cough with nasal congestion and chest tightness.  He reports some unmeasurable fever during the previous night.  He has had no sick contacts.  Reports compliance with chronic medication therapy. Social History   Socioeconomic History  . Marital status: Single    Spouse name: Not on file  . Number of children: Not on file  . Years of education: Not on file  . Highest education level: Not on file  Occupational History  . Not on file  Social Needs  . Financial resource strain: Not on file  . Food insecurity:    Worry: Not on file    Inability: Not on file  . Transportation needs:    Medical: Not on file    Non-medical: Not on file  Tobacco Use  . Smoking status: Former Smoker    Packs/day: 0.25    Years: 30.00    Pack years: 7.50    Types: Cigarettes    Last attempt to quit: 04/17/2017    Years since quitting: 1.2  . Smokeless tobacco: Never Used  Substance and Sexual Activity  . Alcohol use: No    Alcohol/week: 0.0 standard drinks  . Drug use: No  . Sexual activity: Yes  Lifestyle  . Physical activity:    Days per week: Not on file    Minutes per session: Not on file  . Stress: Not on file  Relationships  . Social  connections:    Talks on phone: Not on file    Gets together: Not on file    Attends religious service: Not on file    Active member of club or organization: Not on file    Attends meetings of clubs or organizations: Not on file    Relationship status: Not on file  . Intimate partner violence:    Fear of current or ex partner: Not on file    Emotionally abused: Not on file    Physically abused: Not on file    Forced sexual activity: Not on file  Other Topics Concern  . Not on file  Social History Narrative  . Not on file    History reviewed. No pertinent family history.   Review of Systems Pertinent negatives listed in HPI  Patient Active Problem List   Diagnosis Date Noted  . Spell of dizziness 03/14/2018  . Viral upper respiratory illness 03/14/2018  . Cough variant asthma 11/27/2017  . 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 Cough 06/27/2017  . Rib pain 06/27/2017  . Tobacco use disorder 05/16/2017    No Known Allergies  Prior to Admission medications   Medication Sig Start Date End Date Taking? Authorizing Provider  albuterol (PROVENTIL  HFA;VENTOLIN HFA) 108 (90 Base) MCG/ACT inhaler Inhale 2 puffs into the lungs every 6 (six) hours as needed for wheezing or shortness of breath. 02/01/18  Yes Martyn Ehrich, NP  sildenafil (REVATIO) 20 MG tablet TAKE 1-3 TABLETS BY MOUTH ONCE DAILY AS NEEDED 05/06/18  Yes Shawnee Knapp, MD    Past Medical, Surgical Family and Social History reviewed and updated.    Objective:   Today's Vitals   07/06/18 0815  BP: 131/82  Pulse: 73  Temp: 97.8 F (36.6 C)  TempSrc: Oral  SpO2: 95%  Weight: 186 lb 3.2 oz (84.5 kg)  Height: 6\' 1"  (1.854 m)    Wt Readings from Last 3 Encounters:  07/06/18 186 lb 3.2 oz (84.5 kg)  04/11/18 183 lb (83 kg)  04/03/18 183 lb 6.4 oz (83.2 kg)     Physical Exam General appearance: alert, well developed, well nourished, cooperative and in no distress Head: Normocephalic, without  obvious abnormality, atraumatic Respiratory: Respirations even and unlabored, normal respiratory rate, dry persistent cough noted during exam. Negative for wheezing or accessory muscle usage.  Heart: rate and rhythm normal. No gallop or murmurs noted on exam  Extremities: No gross deformities Skin: Skin color, texture, turgor normal. No rashes seen  Psych: Appropriate mood and affect. Neurologic: Mental status: Alert, oriented to person, place, and time, thought content appropriate.  Assessment & Plan:  1. Chronic bronchitis with acute exacerbation (HCC) - DG Chest 2 View; unremarkable, no evidence of acute changes  Treating empirically with Azithromycin and prednisone. -Brompherarin-pseudoephedrine for management of cough -continue tessalon  -Continue chronic inhaler regimen. -Follow-up with pulmonology if symptoms worsen or do not improve.   2. Nasal congestion -will trial Atrovent nasal spray to relieve congestion    -The patient was given clear instructions to go to ER or return to medical center if symptoms do not improve, worsen or new problems develop. The patient verbalized understanding.     Carroll Sage. Kenton Kingfisher, FNP-C Nurse Practitioner (PRN Staff)  Primary Care at [facility] [address] [place], [place]

## 2018-07-06 NOTE — Telephone Encounter (Signed)
Due to tech issues going on at the office the day of pt appt, no telephone service was available. If pt calls xray is all clear. He is to continue the meds as NP Kim  rx'd.

## 2018-07-06 NOTE — Patient Instructions (Addendum)
Please schedule a follow-up with your Pulmonologist if symptoms worsen or do not improve.  Continue all prescribed medications.  I have placed you on another round of steroids, starting you an antibiotic, and prescribed you cough suppressant.   Review medication list attached and take all medications as prescribed today!   If you have lab work done today you will be contacted with your lab results within the next 2 weeks.  If you have not heard from [place] then please contact us. The fastest way to get your results is to register for My Chart.   IF you received an x-ray today, you will receive an invoice from [facility]. Please contact [facility] at [phone] with questions or concerns regarding your invoice.   IF you received labwork today, you will receive an invoice from [facility]. Please contact [facility] at [phone] with questions or concerns regarding your invoice.   Our billing staff will not be able to assist you with questions regarding bills from these companies.  You will be contacted with the lab results as soon as they are available. The fastest way to get your results is to activate your My Chart account. Instructions are located on the last page of this paperwork. If you have not heard from [place] regarding the results in 2 weeks, please contact this office.      Bronquitis crnica Chronic Bronchitis La bronquitis crnica es una inflamacin prolongada de los conductos que transportan aire a los pulmones (conductos bronquiales). Esta inflamacin ocurre:  [employer] de los das de la [place].  Durante al menos tres meses seguidos.  Durante un perodo de [employer]. Cuando los bronquios se inflaman, comienzan a producir mucosidad. La inflamacin y la acumulacin de mucosidad dificultan la respiracin. La bronquitis crnica normalmente es un problema pulmonar permanente. Es un tipo de enfermedad pulmonar obstructiva crnica (EPOC). Las personas  con bronquitis crnica son ms propensas a Museum/gallery curator resfros o infecciones respiratorias frecuentes. Cules son las causas? La bronquitis crnica ocurre con mayor frecuencia en personas que:  Tienen un asma crnico grave.  Tienen antecedentes de tabaquismo.  Tienen asma y fuman.  Tienen determinadas enfermedades pulmonares.  Han estado expuestas por mucho tiempo a ciertos vapores o sustancias qumicas irritantes. Cules son los signos o los sntomas? Los sntomas de la bronquitis crnica pueden incluir:  Tos que provoca mucosidad (tos productiva).  Falta de aire.  Respiracin ruidosa (sibilancias).  Malestar en el pecho.  Resfros o infecciones respiratorias frecuentes (recurrentes). Existen determinados factores que pueden desencadenar o empeorar los sntomas de la bronquitis crnica, por ejemplo:  Infecciones.  Dejar de tomar determinados medicamentos.  Fumar.  Tener exposicin a sustancias qumicas. Cmo se diagnostica? Esta afeccin se puede diagnosticar en funcin de lo siguiente:  Los sntomas y antecedentes mdicos.  Un examen fsico.  Una radiografa de trax.  Pruebas de la funcin de los pulmones (pulmonar). Cmo se trata? No hay cura para la bronquitis crnica, pero el tratamiento puede ayudar a [employer] sntomas. El tratamiento puede incluir lo siguiente:  Usar un humidificador o vaporizador de niebla fra para Museum/gallery exhibitions officer respiracin.  Beber ms lquidos. Beber ms cantidad de lquidos ayuda a diluir la mucosidad para Museum/gallery exhibitions officer respiracin.  Cambios en el estilo de vida, como seguir una dieta saludable y aumentar la actividad fsica.  Medicamentos, por ejemplo: ? Inhaladores para mejorar el flujo de aire que entra y Audiological scientist de los pulmones. ? Antibiticos para tratar las infecciones bacterianas que tenga, como:  Infeccin pulmonar (  neumona).  Infecciones de los senos paranasales.  Un episodio de bronquitis sbito y grave  (agudo).  Oxigenoterapia.  [place], incluyendo las vacunas contra la neumona y la gripe.  Rehabilitacin pulmonar. Este es un programa que ayuda a Chief Technology Officer los problemas respiratorios y a Engineer, manufacturing systems calidad de vida. Puede durar de 4 a 12 semanas, y puede incluir un programa de ejercicios, educacin, asesoramiento psicolgico y tratamiento de 19. Siga estas indicaciones en su casa: Medicamentos  [employer] de venta libre y los recetados solamente como se lo haya indicado el mdico.  Si le recetaron un antibitico, tmelo como se lo haya indicado el mdico. No deje de tomar el antibitico aunque comience a sentirse mejor. Prevencin de infecciones  Aplquese las vacunas segn se lo indique el mdico. Asegrese de vacunarse contra la gripe todos los aos.  Lvese las manos frecuentemente con agua y [place]. Use desinfectante para manos si no dispone de [place] y [place].  Evite el contacto con personas que tienen sntomas de resfro o gripe. Controlar los sntomas   No fume y evite el humo de otros fumadores. La exposicin al humo del cigarrillo o a irritantes qumicos har que la bronquitis empeore. Si fuma y necesita ayuda para dejar de fumar, consulte al mdico. Si deja de fumar, sus pulmones se curarn ms rpido.  [employer] Educational psychologist, un humidificador o un vaporizador de niebla fra, como se lo haya indicado el mdico.  Evite el polen, el polvo, la caspa de los [place], el moho, el humo y 38 factores que puedan causar falta de aire o sibilancias.  Use oxigenoterapia en su hogar segn las indicaciones. Siga las indicaciones del mdico acerca de cmo usar oxgeno de manera segura y tome precauciones para evitar incendios. Asegrese de no fumar mientras [place] oxgeno ni permita que otras personas fumen en su casa.  No espere a recibir atencin mdica si tiene algn sntoma que le genere preocupacin o dificultad para  respirar. Esperar podra causar una lesin permanente y poner en riesgo la vida. Instrucciones generales  Consulte al mdico sobre qu tipos de actividades son seguras para usted y sobre posibles rutinas de ejercicio. La actividad fsica regular es muy importante para que pueda sentirse mejor.  Beba suficiente lquido para Consulting civil engineer orina de color amarillo plido.  Concurra a todas las visitas de seguimiento como se lo haya indicado el mdico. Esto es importante. Comunquese con un mdico si:  Tiene tos y la falta de [place].  Tiene dolores musculares.  Siente dolor en el pecho.  La mucosidad parece ser ms espesa.  La mucosidad cambia de un color claro o blanco a un color amarillo, verde, gris o sanguinolento. Solicite ayuda de inmediato si:  Sus medicamentos habituales no detienen la sibilancia.  Tiene mucha dificultad para respirar. Estos sntomas pueden representar un problema grave que constituye Engineer, maintenance (IT). No espere hasta que los sntomas desaparezcan. Solicite atencin mdica de inmediato. Comunquese con el servicio de emergencias de su localidad (911 en los [place]). No conduzca por sus propios medios [employer]. Resumen  La bronquitis crnica es una inflamacin prolongada de los conductos que transportan aire a los pulmones (conductos bronquiales).  La bronquitis crnica normalmente es un problema pulmonar permanente. Es un tipo de enfermedad pulmonar obstructiva crnica (EPOC).  No hay cura para la bronquitis crnica, pero el tratamiento puede ayudar a [employer] sntomas.  No fume y evite el humo de otros  fumadores. La exposicin al humo del cigarrillo o a irritantes qumicos har que la bronquitis empeore. Esta informacin no tiene Marine scientist el consejo del mdico. Asegrese de hacerle al mdico cualquier pregunta que tenga. Document Released: 10/10/2007 Document Revised: 07/27/2017 Document Reviewed: 07/27/2017 Elsevier  Interactive Patient Education  [employer].

## 2018-07-08 ENCOUNTER — Ambulatory Visit: Payer: Self-pay | Admitting: Family Medicine

## 2018-08-01 ENCOUNTER — Encounter: Payer: Self-pay | Admitting: Adult Health

## 2018-08-02 ENCOUNTER — Ambulatory Visit (INDEPENDENT_AMBULATORY_CARE_PROVIDER_SITE_OTHER): Payer: BLUE CROSS/BLUE SHIELD | Admitting: Pulmonary Disease

## 2018-08-02 ENCOUNTER — Encounter: Payer: Self-pay | Admitting: Pulmonary Disease

## 2018-08-02 VITALS — BP 116/74 | HR 83 | Temp 97.8°F | Ht 73.0 in | Wt 187.0 lb

## 2018-08-02 DIAGNOSIS — J679 Hypersensitivity pneumonitis due to unspecified organic dust: Secondary | ICD-10-CM

## 2018-08-02 DIAGNOSIS — K219 Gastro-esophageal reflux disease without esophagitis: Secondary | ICD-10-CM

## 2018-08-02 DIAGNOSIS — J432 Centrilobular emphysema: Secondary | ICD-10-CM

## 2018-08-02 DIAGNOSIS — R05 Cough: Secondary | ICD-10-CM | POA: Diagnosis not present

## 2018-08-02 DIAGNOSIS — R059 Cough, unspecified: Secondary | ICD-10-CM

## 2018-08-02 DIAGNOSIS — J301 Allergic rhinitis due to pollen: Secondary | ICD-10-CM

## 2018-08-02 MED ORDER — PREDNISONE 20 MG PO TABS: 20.0000 mg | ORAL_TABLET | Freq: Every day | ORAL | 0 refills | Status: DC

## 2018-08-02 MED ORDER — BUDESONIDE-FORMOTEROL FUMARATE 160-4.5 MCG/ACT IN AERO: 2.0000 | INHALATION_SPRAY | Freq: Two times a day (BID) | RESPIRATORY_TRACT | 5 refills | Status: DC

## 2018-08-02 MED ORDER — GUAIFENESIN-CODEINE 100-10 MG/5ML PO SOLN: 5.0000 mL | Freq: Three times a day (TID) | ORAL | 0 refills | Status: DC | PRN

## 2018-08-02 NOTE — Patient Instructions (Signed)
Allergic rhinitis: Resume over-the-counter cetirizine you can buy this from [employer] Resume over-the-counter fluticasone nasal sprays, you can buy this from [employer].  Use 2 puffs twice a day  Gastroesophageal reflux disease: Resume omeprazole, you can buy this from [employer]  Hypersensitivity pneumonitis with emphysema and exacerbation due to living environment: You need to move out of your house Use Symbicort 2 puffs twice a day no matter how you feel Practice good hand hygiene Stay active  Chronic cough, currently exacerbated by all of the above: Use codeine-containing cough syrup as needed to help suppress the cough, do not take this and drive, do not take this with other sedating medicines.  Narcotic use: I reviewed his [place] registry today in clinic.  He has only filled 2 prescriptions of narcotic in the last 13 months, both of which were used for cough medicine.  Come back and see [place] in 2 to 3 months.

## 2018-08-02 NOTE — Progress Notes (Signed)
Subjective:    Patient ID: Jerry Steele, male    DOB: 08-Dec-1965, 53 y.o.   MRN: [id number]  Synopsis: referred in 2018 for evaluation of cough.  He is a smoker and lung function testing showed no evidence of airflow obstruction but a CT scan did show very mild emphysema  HPI Chief Complaint  Patient presents with  . Acute Visit    pt c/o increased prod cough with clear mucus X3 months.  pt had appt with TP yesterday but pt requested to only be evaluated by BQ.     He says that in November he was very sick with bad coughing episodes and dyspnea.  He says that he got sick again then.  He got rid of the birds but he lives in the same house.  He is really frustrated because he is sick again and now he is having a lot of hoarseness and he is short of breath and had a hard time breathing even with talking.  He says that has noticed that his hemorrhoids are back. He is trying to be carefull about his diet and is avoiding excessive caffeine.   He says that he is noticing some mucus production.  He has not noticed blood in his mucus.    Past Medical History:  Diagnosis Date  . Pneumonia       Review of Systems  Constitutional: Negative for fever and unexpected weight change.  HENT: Positive for congestion. Negative for dental problem, ear pain, nosebleeds, postnasal drip, rhinorrhea, sinus pressure, sneezing, sore throat and trouble swallowing.   Eyes: Negative for redness and itching.  Respiratory: Positive for cough and shortness of breath. Negative for chest tightness and wheezing.   Cardiovascular: Negative for palpitations and leg swelling.  Gastrointestinal: Negative for nausea and vomiting.  Genitourinary: Negative for dysuria.  Musculoskeletal: Negative for joint swelling.  Skin: Negative for rash.  Neurological: Negative for headaches.  Hematological: Does not bruise/bleed easily.  Psychiatric/Behavioral: Negative for dysphoric mood. The patient is not nervous/anxious.         Objective:   Physical Exam Vitals:   08/02/18 0857  BP: 116/74  Pulse: 83  Temp: 97.8 F (36.6 C)  TempSrc: Oral  SpO2: 96%  Weight: 187 lb (84.8 kg)  Height: 6\' 1"  (1.854 m)    Gen: well appearing HENT: OP clear, TM's clear, neck supple PULM: CTA B, normal percussion CV: RRR, no mgr, trace edema GI: BS+, soft, nontender Derm: no cyanosis or rash Psyche: normal mood and affect    Labs: Candida, Setomelanoma, Aureodasaid, Phoma IgE normal  Chest imaging: 03/2017 CXR reviewed, some bronchitis changes but otherwise normal, personally reviewed October 2018 CT chest: Normal pulmonary parenchyma, no evidence of lesion or mass, images independently reviewed  Spirometry: October 2018: Ratio 77%, FVC decreased, 53% predicted In November 2018 ratio 76%, FEV1 2.90 L 66% predicted, FVC 3.81 L 67% predicted, total lung capacity 5.84 L 75% predicted, DLCO 25.32 mL 67% predicted  Exhaled NO: 07/2017 on Dulera 11ppm  CBC    Component Value Date/Time   WBC 9.2 03/14/2018 1112   RBC 4.99 03/14/2018 1112   HGB 15.1 03/14/2018 1112   HGB 16.1 03/07/2017 1112   HCT 45.0 03/14/2018 1112   HCT 47.0 03/07/2017 1112   PLT 167.0 03/14/2018 1112   PLT 185 03/07/2017 1112   MCV 90.0 03/14/2018 1112   MCV 91 03/07/2017 1112   MCH 31.1 03/07/2017 1112   MCH 31.6 (A) 10/08/2015 1050   MCHC  33.6 03/14/2018 1112   RDW 13.3 03/14/2018 1112   RDW 13.7 03/07/2017 1112   LYMPHSABS 1.8 03/14/2018 1112   LYMPHSABS 2.1 03/07/2017 1112   MONOABS 0.6 03/14/2018 1112   EOSABS 0.3 03/14/2018 1112   EOSABS 0.1 03/07/2017 1112   BASOSABS 0.1 03/14/2018 1112   BASOSABS 0.0 03/07/2017 1112   .      Assessment & Plan:   Cough - Plan: DG Chest 2 View  Hypersensitivity pneumonitis (HCC)  Gastroesophageal reflux disease, esophagitis presence not specified  Allergic rhinitis due to pollen, unspecified seasonality  Centrilobular emphysema ([place])  Discussion: [name] is sick  once again because he is not taking any of his medicines.  In addition to this I believe that his hypersensitivity pneumonitis is exacerbated by his home environment.  Medication compliance is a major problem for him.  Plan: Allergic rhinitis: Resume over-the-counter cetirizine you can buy this from [employer] Resume over-the-counter fluticasone nasal sprays, you can buy this from [employer].  Use 2 puffs twice a day  Gastroesophageal reflux disease: Resume omeprazole, you can buy this from [employer]  Hypersensitivity pneumonitis with emphysema and exacerbation due to living environment: You need to move out of your house Use Symbicort 2 puffs twice a day no matter how you feel Practice good hand hygiene Stay active  Chronic cough, currently exacerbated by all of the above: Use codeine-containing cough syrup as needed to help suppress the cough, do not take this and drive, do not take this with other sedating medicines.  Narcotic use: I reviewed his [place] registry today in clinic.  He has only filled 2 prescriptions of narcotic in the last 13 months, both of which were used for cough medicine.  Come back and see [place] in 2 to 3 months.  Greater than 50% of this 30-minute visit was spent face-to-face     Current Outpatient Medications:  .  albuterol (PROVENTIL HFA;VENTOLIN HFA) 108 (90 Base) MCG/ACT inhaler, Inhale 2 puffs into the lungs every 6 (six) hours as needed for wheezing or shortness of breath., Disp: 1 Inhaler, Rfl: 0 .  ipratropium (ATROVENT) 0.03 % nasal spray, Place 2 sprays into both nostrils 2 (two) times daily., Disp: 30 mL, Rfl: 0 .  sildenafil (REVATIO) 20 MG tablet, TAKE 1-3 TABLETS BY MOUTH ONCE DAILY AS NEEDED, Disp: 30 tablet, Rfl: 11 .  budesonide-formoterol (SYMBICORT) 160-4.5 MCG/ACT inhaler, Inhale 2 puffs into the lungs 2 (two) times daily., Disp: 1 Inhaler, Rfl: 5 .  predniSONE (DELTASONE) 20 MG tablet, Take 1 tablet (20 mg total) by mouth  daily with breakfast., Disp: 5 tablet, Rfl: 0

## 2018-08-08 ENCOUNTER — Ambulatory Visit (INDEPENDENT_AMBULATORY_CARE_PROVIDER_SITE_OTHER)
Admission: RE | Admit: 2018-08-08 | Discharge: 2018-08-08 | Disposition: A | Discharge status: Home / Self Care | Payer: BLUE CROSS/BLUE SHIELD | Source: Ambulatory Visit | Attending: Pulmonary Disease | Admitting: Pulmonary Disease

## 2018-08-08 DIAGNOSIS — R05 Cough: Secondary | ICD-10-CM

## 2018-08-08 DIAGNOSIS — R059 Cough, unspecified: Secondary | ICD-10-CM

## 2018-08-20 ENCOUNTER — Encounter: Payer: Self-pay | Admitting: Physician Assistant

## 2018-08-20 ENCOUNTER — Other Ambulatory Visit: Payer: Self-pay

## 2018-08-20 ENCOUNTER — Ambulatory Visit: Payer: BLUE CROSS/BLUE SHIELD | Admitting: Physician Assistant

## 2018-08-20 VITALS — BP 122/84 | HR 73 | Temp 97.9°F | Resp 18 | Ht 73.0 in | Wt 187.4 lb

## 2018-08-20 DIAGNOSIS — J45991 Cough variant asthma: Secondary | ICD-10-CM

## 2018-08-20 DIAGNOSIS — J392 Other diseases of pharynx: Secondary | ICD-10-CM

## 2018-08-20 DIAGNOSIS — R05 Cough: Secondary | ICD-10-CM | POA: Diagnosis not present

## 2018-08-20 DIAGNOSIS — J679 Hypersensitivity pneumonitis due to unspecified organic dust: Secondary | ICD-10-CM | POA: Diagnosis not present

## 2018-08-20 DIAGNOSIS — R059 Cough, unspecified: Secondary | ICD-10-CM

## 2018-08-20 NOTE — Progress Notes (Signed)
Jerry Steele  MRN: [id number] DOB: December 26, 1965  PCP: Shawnee Knapp, MD  Subjective:  Pt is a 53 year old male who presents to clinic for sore throat and cough.  Cough x 2-3 months.   Jerry Steele was seen by pulmonology 08/02/2018. Note from that OV: "Jerry Steele is sick once again because Jerry Steele is not taking any of his medicines.  In addition to this I believe that his hypersensitivity pneumonitis is exacerbated by his home environment.  Medication compliance is a major problem for him." Jerry Steele was advised to resume otc cetirizine, flonase and omeprazole. For hypersensitivity pneumonitis Jerry Steele has been advised to move out of his home and con't Symbicort 2 puffs bid.  Asked to return for f/u appt in 2-3 months.   Chest x-ray 08/09/18 showed no acute cardiopulmonary disease.  Today c/o cough that won't go away and a dry throat that causes him to cough. Jerry Steele is asking for an antibiotic.  Jerry Steele was prescribed azithromycin in December for the same symptoms with no improvement.  Denies fever, chills, sweats, fatigue, hemoptysis.   Jerry Steele has been taking cold medicine.  Jerry Steele is not taking otc antihistamine.  Jerry Steele is taking Symbicort twice daily.  Jerry Steele does not use Albuterol often  Moved into his house about ten years ago. Jerry Steele started getting sick with cough after moving in. "When I go to the beach for a week, I stop coughing". Jerry Steele has 14 dogs.     Review of Systems  Constitutional: Negative for chills, diaphoresis, fatigue and fever.  HENT: Positive for sore throat. Negative for congestion, rhinorrhea, sinus pressure, sinus pain and voice change.   Respiratory: Positive for cough. Negative for chest tightness, shortness of breath and wheezing.     Patient Active Problem List   Diagnosis Date Noted  . Spell of dizziness 03/14/2018  . Viral upper respiratory illness 03/14/2018  . Cough variant asthma 11/27/2017  . Hypersensitivity pneumonitis ([place]) 09/06/2017  . Cough 06/27/2017  . Rib pain 06/27/2017  .  Tobacco use disorder 05/16/2017    Current Outpatient Medications on File Prior to Visit  Medication Sig Dispense Refill  . albuterol (PROVENTIL HFA;VENTOLIN HFA) 108 (90 Base) MCG/ACT inhaler Inhale 2 puffs into the lungs every 6 (six) hours as needed for wheezing or shortness of breath. (Patient not taking: Reported on 08/20/2018) 1 Inhaler 0  . budesonide-formoterol (SYMBICORT) 160-4.5 MCG/ACT inhaler Inhale 2 puffs into the lungs 2 (two) times daily. (Patient not taking: Reported on 08/20/2018) 1 Inhaler 5  . guaiFENesin-codeine 100-10 MG/5ML syrup Take 5 mLs by mouth 3 (three) times daily as needed for cough. (Patient not taking: Reported on 08/20/2018) 180 mL 0  . ipratropium (ATROVENT) 0.03 % nasal spray Place 2 sprays into both nostrils 2 (two) times daily. (Patient not taking: Reported on 08/20/2018) 30 mL 0  . sildenafil (REVATIO) 20 MG tablet TAKE 1-3 TABLETS BY MOUTH ONCE DAILY AS NEEDED (Patient not taking: Reported on 08/20/2018) 30 tablet 11   No current facility-administered medications on file prior to visit.     No Known Allergies   Objective:  BP 122/84   Pulse 73   Temp 97.9 F (36.6 C) (Oral)   Resp 18   Ht 6\' 1"  (1.854 m)   Wt 187 lb 6.4 oz (85 kg)   SpO2 96%   BMI 24.72 kg/m   Physical Exam Vitals signs reviewed.  Constitutional:      General: Jerry Steele is not in acute distress. HENT:  Right Ear: Tympanic membrane normal.     Left Ear: Tympanic membrane normal.     Nose: Mucosal edema present. No rhinorrhea.     Right Sinus: No maxillary sinus tenderness or frontal sinus tenderness.     Left Sinus: No maxillary sinus tenderness or frontal sinus tenderness.  Cardiovascular:     Rate and Rhythm: Normal rate and regular rhythm.     Heart sounds: Normal heart sounds.  Pulmonary:     Effort: Pulmonary effort is normal. No respiratory distress.     Breath sounds: Normal breath sounds. No wheezing or rales.  Skin:    General: Skin is warm and dry.  Neurological:      Mental Status: Jerry Steele is alert and oriented to person, place, and time.  Psychiatric:        Judgment: Judgment normal.     Assessment and Plan :  1. Hypersensitivity pneumonitis ([place]) 2. Cough variant asthma 3. Dry throat - Ambulatory referral to ENT  4. Cough - Pt presents c/o persistent cough x 3 months. No red flags. NAD. Recent check x-ray is negative. Jerry Steele was treated with azithromycin 6 weeks ago for the same problem. Jerry Steele has h/o medication noncompliance. Sees pulmonologist on a regular basis - suspect his home environment is significantly contributing to his condition. Pt is not willing to move due to his 14 dogs. Advised pt start certirizide, as directed by pulm. F/u with pulm in 2 months. Pt would like ENT referral, okay to refer at this time. Otherwise, stay well hydrated.    Mercer Pod, PA-C  [facility] 08/20/2018 10:41 AM  Please note: Portions of this report may have been transcribed using dragon voice recognition software. Every effort was made to ensure accuracy; however, inadvertent computerized transcription errors may be present.

## 2018-08-20 NOTE — Patient Instructions (Addendum)
Start taking over-the-counter cetirizine you can buy this from [employer] about moving out of your house.   Cetirizine tablets Qu es este medicamento? La CETIRIZINA es un antihistamnico. Este medicamento se [place] para tratar o prevenir los sntomas de [place]. Tambin puede reducir la picazn de la piel en aquellas personas con erupciones urticantes o ronchas. Este medicamento puede ser utilizado para otros usos; si tiene alguna pregunta consulte con su proveedor de atencin mdica o con su farmacutico. MARCAS COMUNES: All Day Allergy, Allergy Relief, Zyrtec, Zyrtec Hives Relief Qu le debo informar a mi profesional de la salud antes de tomar este medicamento? Necesita saber si usted presenta alguno de los [employer] o situaciones: -enfermedad heptica -enfermedad renal -una reaccin alrgica o inusual a la cetirizina, otros medicamentos, alimentos, colorantes o conservadores -si est embarazada o buscando quedar embarazada -si est amamantando a un beb Cmo debo utilizar este medicamento? Tome este medicamento por va oral con un vaso de agua. Siga las instrucciones de la etiqueta del [place]. Este medicamento se puede tomar con alimentos o con el [place] sus dosis a intervalos regulares. No lo tome con una frecuencia mayor que la indicada. Es posible que sea necesario que tome el medicamento durante varios das antes de que los sntomas mejoren. Hable con su pediatra para informarse acerca del uso de este medicamento en nios. Puede requerir atencin especial. Aunque este medicamento puede ser recetado a nios tan menores como de 6 aos de edad para condiciones selectivas, las precauciones se aplican. Sobredosis: Pngase en contacto inmediatamente con un centro toxicolgico o una sala de urgencia si usted cree que haya tomado demasiado medicamento. ATENCIN: [employer] es solo para usted. No comparta este medicamento con nadie. Qu sucede si  me olvido de una dosis? Si olvida una dosis, tmela lo antes posible. Si es casi la hora de la prxima dosis, tome slo esa dosis. No tome dosis adicionales o dobles. Qu puede interactuar con este medicamento? alcohol ciertos medicamentos para la ansiedad o para conciliar el sueo medicamentos narcticos para Conservation officer, historic buildings otros medicamentos para los resfros o las alergias Puede ser que esta lista no menciona todas las posibles interacciones. Informe a su profesional de [employer]	[place] de [employer] productos a base de hierbas, medicamentos de [place] o suplementos nutritivos que est tomando. Si usted fuma, consume bebidas alcohlicas o si utiliza drogas ilegales, indqueselo tambin a su profesional de [employer]	[place]. Algunas sustancias pueden interactuar con su medicamento. A qu debo estar atento al usar [employer]? Visite a su mdico o a su profesional de la salud para chequear su evolucin peridicamente. Consulte a su mdico si sus sntomas no mejoran. Puede experimentar mareos o somnolencia. No conduzca ni utilice maquinaria ni haga nada que Associate Professor en estado de alerta hasta que sepa cmo le afecta este medicamento. No se siente ni se ponga de pie con rapidez, especialmente si es un paciente de edad avanzada. Esto reduce el riesgo de mareos o [employer]. Se le podr secar la boca. Masticar chicle sin azcar, chupar caramelos duros y tomar agua en abundancia le ayudar a mantener la boca hmeda. Si el problema no desaparece o es severo, consulte a su mdico. Qu efectos secundarios puedo tener al [employer] este medicamento? Efectos secundarios que debe informar a su mdico o a Barrister's clerk de la salud tan pronto como sea posible: Chief of Staff, como erupcin cutnea, picazn o urticarias, hinchazn de la cara, los labios o la lengua cambios en la  visin o audicin pulso cardiaco rpido o irregular dificultad para orinar o cambios en el volumen de orina Efectos secundarios que, por lo  general, no requieren atencin mdica (debe informarlos a su mdico o a Barrister's clerk de la salud si persisten o si son molestos): mareos boca seca irritabilidad dolor de Press photographer estomacal cansancio Puede ser que esta lista no menciona todos los posibles efectos secundarios. Comunquese a su mdico por asesoramiento mdico [employer]. Usted puede informar los efectos secundarios a la FDA por telfono al 1-800-FDA-1088. Dnde debo guardar mi medicina? Mantngala fuera del alcance de los nios. [place]a a [employer], entre 15 y 23 grados C (76 y 23 grados F). Deseche todo el medicamento que no haya utilizado, despus de la fecha de vencimiento. ATENCIN: Este folleto es un resumen. Puede ser que no cubra toda la posible informacin. Si usted tiene preguntas acerca de esta medicina, consulte con su mdico, su farmacutico o su profesional de Technical sales engineer.  2019 Elsevier/Gold Standard (2016-08-03 00:00:00)

## 2018-08-30 ENCOUNTER — Encounter: Payer: Self-pay | Admitting: Adult Health

## 2018-08-30 ENCOUNTER — Ambulatory Visit (INDEPENDENT_AMBULATORY_CARE_PROVIDER_SITE_OTHER): Payer: BLUE CROSS/BLUE SHIELD | Admitting: Adult Health

## 2018-08-30 VITALS — BP 120/80 | HR 78 | Ht 75.0 in | Wt 187.6 lb

## 2018-08-30 DIAGNOSIS — J209 Acute bronchitis, unspecified: Secondary | ICD-10-CM

## 2018-08-30 DIAGNOSIS — R05 Cough: Secondary | ICD-10-CM | POA: Diagnosis not present

## 2018-08-30 DIAGNOSIS — R059 Cough, unspecified: Secondary | ICD-10-CM

## 2018-08-30 DIAGNOSIS — J679 Hypersensitivity pneumonitis due to unspecified organic dust: Secondary | ICD-10-CM

## 2018-08-30 MED ORDER — LEVALBUTEROL HCL 0.63 MG/3ML IN NEBU
0.6300 mg | INHALATION_SOLUTION | Freq: Once | RESPIRATORY_TRACT | Status: AC
  Administered 2018-08-30: 0.63 mg via RESPIRATORY_TRACT

## 2018-08-30 MED ORDER — BUDESONIDE-FORMOTEROL FUMARATE 160-4.5 MCG/ACT IN AERO: 2.0000 | INHALATION_SPRAY | Freq: Two times a day (BID) | RESPIRATORY_TRACT | 0 refills | Status: DC

## 2018-08-30 MED ORDER — BUDESONIDE-FORMOTEROL FUMARATE 160-4.5 MCG/ACT IN AERO: 2.0000 | INHALATION_SPRAY | Freq: Two times a day (BID) | RESPIRATORY_TRACT | 5 refills | Status: DC

## 2018-08-30 MED ORDER — AZITHROMYCIN 250 MG PO TABS: ORAL_TABLET | ORAL | 0 refills | Status: AC

## 2018-08-30 NOTE — Progress Notes (Signed)
$'@Patient'Y$  ID: Jerry Steele, male    DOB: 02/16/1966, 53 y.o.   MRN: [id number]  Chief Complaint  Patient presents with  . 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   TEST/EVENTS :  03/2017 CXR reviewed, some bronchitis changes but otherwise normal, personally reviewed October 2018 CT chest: Normal pulmonary parenchyma, no evidence of lesion or mass, images independently reviewed  Spirometry: October 2018: Ratio 77%, FVC decreased, 53% predicted In November 2018 ratio 76%, FEV1 2.90 L 66% predicted, FVC 3.81 L 67% predicted, total lung capacity 5.84 L 75% predicted, DLCO 25.32 mL 67% predicted     +BD response  Labs :  Eosinophils ok. ESR ok . Mold allergen profile neg. Exhaled nitric oxide 11 , nicotine testing neg.  HIV neg 2010 ,2016   HSP - pigeon serum abx POSITIVE   08/30/2018 Acute OV : Cough  Patient presents for an acute office visit.  He complains of 3 weeks of increased cough congestion difficult to cough up thick mucus intermittent wheezing and sinus drainage. Patient says he has not been taking his Symbicort or medications as recommended.  Discussed the importance of medication compliance.  Patient denies any fever rash nausea vomiting or diarrhea. He denies any recent travel.  No fever or body aches. Has not been using any over-the-counter medications for treatment.  Chest xray 07/2018 showed NAD .   No Known Allergies   There is no immunization history on file for this patient.  Past Medical History:  Diagnosis Date  . Pneumonia     Tobacco History: Social History   Tobacco Use  Smoking Status Former Smoker  . Packs/day: 0.25  . Years: 30.00  . Pack years: 7.50  . Types:  Cigarettes  . Last attempt to quit: 12/16/2016  . Years since quitting: 1.7  Smokeless Tobacco Never Used   Counseling given: Not Answered   Outpatient Medications Prior to Visit  Medication Sig Dispense Refill  . albuterol (PROVENTIL HFA;VENTOLIN HFA) 108 (90 Base) MCG/ACT inhaler Inhale 2 puffs into the lungs every 6 (six) hours as needed for wheezing or shortness of breath. (Patient not taking: Reported on 08/20/2018) 1 Inhaler 0  . guaiFENesin-codeine 100-10 MG/5ML syrup Take 5 mLs by mouth 3 (three) times daily as needed for cough. (Patient not taking: Reported on 08/20/2018) 180 mL 0  . ipratropium (ATROVENT) 0.03 % nasal spray Place 2 sprays into both nostrils 2 (two) times daily. (Patient not taking: Reported on 08/20/2018) 30 mL 0  . sildenafil (REVATIO) 20 MG tablet TAKE 1-3 TABLETS BY MOUTH ONCE DAILY AS NEEDED (Patient not taking: Reported on 08/20/2018) 30 tablet 11  . budesonide-formoterol (SYMBICORT) 160-4.5 MCG/ACT inhaler Inhale 2 puffs into the lungs 2 (two) times daily. (Patient not taking: Reported on 08/20/2018) 1 Inhaler 5   No facility-administered medications prior to visit.      Review of Systems:   Constitutional:   No  weight loss, night sweats,  Fevers, chills, fatigue, or  lassitude.  HEENT:   No headaches,  Difficulty swallowing,  Tooth/dental problems, or  Sore throat,                No sneezing, itching, ear ache,  +nasal congestion, post nasal drip,   CV:  No chest  pain,  Orthopnea, PND, swelling in lower extremities, anasarca, dizziness, palpitations, syncope.   GI  No heartburn, indigestion, abdominal pain, nausea, vomiting, diarrhea, change in bowel habits, loss of appetite, bloody stools.   Resp:   No chest wall deformity  Skin: no rash or lesions.  GU: no dysuria, change in color of urine, no urgency or frequency.  No flank pain, no hematuria   MS:  No joint pain or swelling.  No decreased range of motion.  No back pain.    Physical Exam  BP  120/80 (BP Location: Left Arm, Cuff Size: Normal)   Pulse 78   Ht 6' 3"  (1.905 m)   Wt 187 lb 9.6 oz (85.1 kg)   SpO2 97%   BMI 23.45 kg/m   GEN: A/Ox3; pleasant , NAD, well nourished    HEENT:  Basin/AT,  EACs-clear, TMs-wnl, NOSE-clear drainage  THROAT-clear, no lesions, no postnasal drip or exudate noted. Poor dentition   NECK:  Supple w/ fair ROM; no JVD; normal carotid impulses w/o bruits; no thyromegaly or nodules palpated; no lymphadenopathy.    RESP  Few trace rhonchi   no accessory muscle use, no dullness to percussion  CARD:  RRR, no m/r/g, no peripheral edema, pulses intact, no cyanosis or clubbing.  GI:   Soft & nt; nml bowel sounds; no organomegaly or masses detected.   Musco: Warm bil, no deformities or joint swelling noted.   Neuro: alert, no focal deficits noted.    Skin: Warm, no lesions or rashes    Lab Results:  CBC  No results found for: BNP  ProBNP No results found for: PROBNP  Imaging: Dg Chest 2 View  Result Date: 08/09/2018 CLINICAL DATA:  Evaluate cough. EXAM: CHEST - 2 VIEW COMPARISON:  07/06/2018. 02/01/2018. 08/02/2017. 06/20/2018. CT 05/07/2017. FINDINGS: Mediastinum and hilar structures normal. No acute infiltrate. Stable symmetric nodular opacities over both lung bases seen only on PA view and consistent with nipple shadows. No pleural effusion or pneumothorax. Heart size normal. Degenerative changes scoliosis thoracic spine. IMPRESSION: No acute cardiopulmonary disease. Electronically Signed   By: Marcello Moores  Register   On: 08/09/2018 06:39    levalbuterol Penne Lash) nebulizer solution 0.63 mg    Date Action Dose Route User   08/30/2018 1203 Given 0.63 mg Nebulization Annie Paras D, LPN      PFT Results Latest Ref Rng & Units 05/23/2017  FVC-Pre L 3.51  FVC-Predicted Pre % 61  FVC-Post L 3.81  FVC-Predicted Post % 67  Pre FEV1/FVC % % 74  Post FEV1/FCV % % 76  FEV1-Pre L 2.60  FEV1-Predicted Pre % 59  FEV1-Post L 2.90  DLCO UNC% % 67    DLCO COR %Predicted % 92  TLC L 5.84  TLC % Predicted % 75  RV % Predicted % 90    No results found for: NITRICOXIDE      Assessment & Plan:   Acute bronchitis URI/Flare along with medication noncompliance and suspected Hypersensitivty pneumonitis (bird/pigeon exposure)  Home environment discussed  Medication compliance   Plan  Patient Instructions  Restart Symbicort 2 puffs Twice daily  , rinse after use. Sample given  Contact your BCBS to see what your formulary covers.  Use Good RX card to see if this helps.  Begin Zyrtec 57m At bedtime   Begin Flonase 2 puffs daily .  Zpack take as directed  Mucinex DM Twice daily  As needed  Cough/congestion  Follow up with Dr. MLake Bellsin 3-4 months and As needed  Please contact office for sooner follow up if symptoms do not improve or worsen or seek emergency care       Hypersensitivity pneumonitis ([place]) Avoidance of pigeon/bird and home environment .   Plan  Patient Instructions  Restart Symbicort 2 puffs Twice daily  , rinse after use. Sample given  Contact your BCBS to see what your formulary covers.  Use Good RX card to see if this helps.  Begin Zyrtec 68m At bedtime   Begin Flonase 2 puffs daily .  Zpack take as directed  Mucinex DM Twice daily  As needed  Cough/congestion  Follow up with Dr. MLake Bellsin 3-4 months and As needed   Please contact office for sooner follow up if symptoms do not improve or worsen or seek emergency care          TRexene Edison NP 08/30/2018

## 2018-08-30 NOTE — Patient Instructions (Addendum)
Restart Symbicort 2 puffs Twice daily  , rinse after use. Sample given  Contact your BCBS to see what your formulary covers.  Use Good RX card to see if this helps.  Begin Zyrtec 10mg  At bedtime   Begin Flonase 2 puffs daily .  Zpack take as directed  Mucinex DM Twice daily  As needed  Cough/congestion  Follow up with Dr. Lake Bells in 3-4 months and As needed   Please contact office for sooner follow up if symptoms do not improve or worsen or seek emergency care

## 2018-08-30 NOTE — Assessment & Plan Note (Signed)
Avoidance of pigeon/bird and home environment .   Plan  Patient Instructions  Restart Symbicort 2 puffs Twice daily  , rinse after use. Sample given  Contact your [employer] to see what your formulary covers.  Use Good RX card to see if this helps.  Begin Zyrtec 10mg  At bedtime   Begin Flonase 2 puffs daily .  Zpack take as directed  Mucinex DM Twice daily  As needed  Cough/congestion  Follow up with Dr. Lake Bells in 3-4 months and As needed   Please contact office for sooner follow up if symptoms do not improve or worsen or seek emergency care

## 2018-08-30 NOTE — Assessment & Plan Note (Signed)
URI/Flare along with medication noncompliance and suspected Hypersensitivty pneumonitis (bird/pigeon exposure)  Home environment discussed  Medication compliance   Plan  Patient Instructions  Restart Symbicort 2 puffs Twice daily  , rinse after use. Sample given  Contact your BCBS to see what your formulary covers.  Use Good RX card to see if this helps.  Begin Zyrtec 10mg  At bedtime   Begin Flonase 2 puffs daily .  Zpack take as directed  Mucinex DM Twice daily  As needed  Cough/congestion  Follow up with Dr. Lake Bells in 3-4 months and As needed   Please contact office for sooner follow up if symptoms do not improve or worsen or seek emergency care

## 2018-09-02 NOTE — Progress Notes (Signed)
Reviewed, agree 

## 2018-10-03 ENCOUNTER — Other Ambulatory Visit: Payer: Self-pay

## 2018-10-03 ENCOUNTER — Encounter: Payer: Self-pay | Admitting: Pulmonary Disease

## 2018-10-03 ENCOUNTER — Ambulatory Visit: Payer: BLUE CROSS/BLUE SHIELD | Admitting: Pulmonary Disease

## 2018-10-03 VITALS — BP 120/80 | HR 88 | Ht 75.0 in | Wt 186.0 lb

## 2018-10-03 DIAGNOSIS — K219 Gastro-esophageal reflux disease without esophagitis: Secondary | ICD-10-CM | POA: Diagnosis not present

## 2018-10-03 DIAGNOSIS — R05 Cough: Secondary | ICD-10-CM | POA: Diagnosis not present

## 2018-10-03 DIAGNOSIS — J679 Hypersensitivity pneumonitis due to unspecified organic dust: Secondary | ICD-10-CM

## 2018-10-03 DIAGNOSIS — J432 Centrilobular emphysema: Secondary | ICD-10-CM

## 2018-10-03 DIAGNOSIS — R059 Cough, unspecified: Secondary | ICD-10-CM

## 2018-10-03 DIAGNOSIS — J301 Allergic rhinitis due to pollen: Secondary | ICD-10-CM | POA: Diagnosis not present

## 2018-10-03 MED ORDER — BUDESONIDE-FORMOTEROL FUMARATE 160-4.5 MCG/ACT IN AERO: 2.0000 | INHALATION_SPRAY | Freq: Two times a day (BID) | RESPIRATORY_TRACT | 0 refills | Status: DC

## 2018-10-03 NOTE — Patient Instructions (Signed)
Allergic rhinitis Take cetirizine 10 mg daily no matter how you feel, you can buy this from [employer] Take fluticasone 2 sprays each nostril daily Use saline salt water rinses in your nose to help clear the mucus out  Recurrent cough with hypersensitivity pneumonitis and asthma: Take Symbicort 2 puffs twice a day no matter how you feel, sample given today, you need to pick up the prescription for this or let us know if there is a more affordable option from your insurance  Gastroesophageal reflux disease: Take omeprazole 20 mg daily, you can buy this from [employer]  Recurrent cough: Take the medications above these should help  Follow-up 6 months or sooner if needed

## 2018-10-03 NOTE — Progress Notes (Signed)
Subjective:    Patient ID: Jerry Steele, male    DOB: 1965-10-15, 53 y.o.   MRN: [id number]  Synopsis: referred in 2018 for evaluation of cough.  He is a smoker and lung function testing showed no evidence of airflow obstruction but a CT scan did show very mild emphysema  HPI Chief Complaint  Patient presents with  . Follow-up    Nasal and chest congestion increased for the past week. Productive cough with white mucus. Denies any fever.    For de returns to clinic today complaining of cough.  He says that his sinuses have been stopped up, he has itchy eyes scratchy throat.  No sick contacts, no travel, no fever.  He has been coughing up a bit more mucus.  In regards to the medicines he is supposed to be taking he is not taking Symbicort, omeprazole, or cetirizine.  He is asking for cough medicine.  Past Medical History:  Diagnosis Date  . Pneumonia       Review of Systems  Constitutional: Negative for fever and unexpected weight change.  HENT: Positive for congestion. Negative for dental problem, ear pain, nosebleeds, postnasal drip, rhinorrhea, sinus pressure, sneezing, sore throat and trouble swallowing.   Eyes: Negative for redness and itching.  Respiratory: Positive for cough and shortness of breath. Negative for chest tightness and wheezing.   Cardiovascular: Negative for palpitations and leg swelling.  Gastrointestinal: Negative for nausea and vomiting.  Genitourinary: Negative for dysuria.  Musculoskeletal: Negative for joint swelling.  Skin: Negative for rash.  Neurological: Negative for headaches.  Hematological: Does not bruise/bleed easily.  Psychiatric/Behavioral: Negative for dysphoric mood. The patient is not nervous/anxious.        Objective:   Physical Exam Vitals:   10/03/18 0859  BP: 120/80  Pulse: 88  SpO2: 95%  Weight: 186 lb (84.4 kg)  Height: 6\' 3"  (1.905 m)    Gen: well appearing HENT: OP clear, TM's clear, neck supple PULM: CTA B,  normal percussion CV: RRR, no mgr, trace edema GI: BS+, soft, nontender Derm: no cyanosis or rash Psyche: normal mood and affect   Labs: Candida, Setomelanoma, Aureodasaid, Phoma IgE normal  Chest imaging: 03/2017 CXR reviewed, some bronchitis changes but otherwise normal, personally reviewed October 2018 CT chest: Normal pulmonary parenchyma, no evidence of lesion or mass, images independently reviewed  Spirometry: October 2018: Ratio 77%, FVC decreased, 53% predicted In November 2018 ratio 76%, FEV1 2.90 L 66% predicted, FVC 3.81 L 67% predicted, total lung capacity 5.84 L 75% predicted, DLCO 25.32 mL 67% predicted  Exhaled NO: 07/2017 on Dulera 11ppm  CBC    Component Value Date/Time   WBC 9.2 03/14/2018 1112   RBC 4.99 03/14/2018 1112   HGB 15.1 03/14/2018 1112   HGB 16.1 03/07/2017 1112   HCT 45.0 03/14/2018 1112   HCT 47.0 03/07/2017 1112   PLT 167.0 03/14/2018 1112   PLT 185 03/07/2017 1112   MCV 90.0 03/14/2018 1112   MCV 91 03/07/2017 1112   MCH 31.1 03/07/2017 1112   MCH 31.6 (A) 10/08/2015 1050   MCHC 33.6 03/14/2018 1112   RDW 13.3 03/14/2018 1112   RDW 13.7 03/07/2017 1112   LYMPHSABS 1.8 03/14/2018 1112   LYMPHSABS 2.1 03/07/2017 1112   MONOABS 0.6 03/14/2018 1112   EOSABS 0.3 03/14/2018 1112   EOSABS 0.1 03/07/2017 1112   BASOSABS 0.1 03/14/2018 1112   BASOSABS 0.0 03/07/2017 1112   .      Assessment & Plan:  Cough  Hypersensitivity pneumonitis (HCC)  Gastroesophageal reflux disease, esophagitis presence not specified  Allergic rhinitis due to pollen, unspecified seasonality  Centrilobular emphysema ([place])  Discussion: Jerry Steele returns to clinic yet again with cough due to poorly controlled allergic rhinitis and hypersensitivity pneumonitis and gastroesophageal reflux disease.  It is becoming a recurrent theme here: Every visit he shows up coughing because he is not taking the omeprazole, cetirizine, or Symbicort I tell him he supposed  to take.  He asked for a narcotic today and I said no because he is not taking any of the medications that I have asked him to take repeatedly.  Plan: Allergic rhinitis Take cetirizine 10 mg daily no matter how you feel, you can buy this from [employer] Take fluticasone 2 sprays each nostril daily Use saline salt water rinses in your nose to help clear the mucus out  Recurrent cough with hypersensitivity pneumonitis and asthma: Take Symbicort 2 puffs twice a day no matter how you feel, sample given today, you need to pick up the prescription for this or let us know if there is a more affordable option from your insurance  Gastroesophageal reflux disease: Take omeprazole 20 mg daily, you can buy this from [employer]  Recurrent cough: Take the medications above these should help  Follow-up 6 months or sooner if needed     Current Outpatient Medications:  .  albuterol (PROVENTIL HFA;VENTOLIN HFA) 108 (90 Base) MCG/ACT inhaler, Inhale 2 puffs into the lungs every 6 (six) hours as needed for wheezing or shortness of breath., Disp: 1 Inhaler, Rfl: 0 .  budesonide-formoterol (SYMBICORT) 160-4.5 MCG/ACT inhaler, Inhale 2 puffs into the lungs 2 (two) times daily., Disp: 1 Inhaler, Rfl: 5 .  sildenafil (REVATIO) 20 MG tablet, TAKE 1-3 TABLETS BY MOUTH ONCE DAILY AS NEEDED, Disp: 30 tablet, Rfl: 11

## 2018-10-04 ENCOUNTER — Ambulatory Visit: Payer: BLUE CROSS/BLUE SHIELD | Admitting: Pulmonary Disease

## 2018-10-21 ENCOUNTER — Telehealth: Payer: Self-pay | Admitting: Adult Health

## 2018-10-21 NOTE — Telephone Encounter (Signed)
Patient came to office complaining of cough.  Patient stated he has a chronic, non productive cough.  Patient stated he has acid reflux, stuffy nose, chest soreness from cough, and non productive cough. Patient stated BQ had told him if he was not better to call him for cough medication. No cough med on current med list or last note. Patient stated he has taken zyrtec and flonase for his allergies, with little relief.  He has not taken his omeprazole.  Explained it was OTC, so he did not need a prescription for it.  Patient stated he is using his inhaler everyday. Due to time,  offered tele visit.  Patient accepted tele visit with Alvira Monday, NP, for 10/22/18, at 2pm.  Call cell phone [phone].  Patient understands TP will call him.

## 2018-10-22 ENCOUNTER — Encounter: Payer: Self-pay | Admitting: Adult Health

## 2018-10-22 ENCOUNTER — Ambulatory Visit (INDEPENDENT_AMBULATORY_CARE_PROVIDER_SITE_OTHER): Payer: BLUE CROSS/BLUE SHIELD | Admitting: Adult Health

## 2018-10-22 ENCOUNTER — Other Ambulatory Visit: Payer: Self-pay

## 2018-10-22 DIAGNOSIS — R05 Cough: Secondary | ICD-10-CM | POA: Diagnosis not present

## 2018-10-22 DIAGNOSIS — J301 Allergic rhinitis due to pollen: Secondary | ICD-10-CM | POA: Diagnosis not present

## 2018-10-22 DIAGNOSIS — K219 Gastro-esophageal reflux disease without esophagitis: Secondary | ICD-10-CM

## 2018-10-22 DIAGNOSIS — J679 Hypersensitivity pneumonitis due to unspecified organic dust: Secondary | ICD-10-CM | POA: Diagnosis not present

## 2018-10-22 DIAGNOSIS — R053 Chronic cough: Secondary | ICD-10-CM

## 2018-10-22 MED ORDER — PROMETHAZINE-CODEINE 6.25-10 MG/5ML PO SYRP: 5.0000 mL | ORAL_SOLUTION | Freq: Four times a day (QID) | ORAL | 0 refills | Status: DC | PRN

## 2018-10-22 NOTE — Patient Instructions (Signed)
Allergic rhinitis Take cetirizine 10 mg daily no matter how you feel, you can buy this from [employer], this is over this is over the counter.  Take fluticasone 2 sprays each nostril daily, this is over the counter.  Use saline salt water rinses in your nose to help clear the mucus out  Recurrent cough with hypersensitivity pneumonitis and asthma: Take Symbicort 2 puffs twice a day no matter how you feel, sample given today, you need to pick up the prescription for this or let us know if there is a more affordable option from your insurance Will need to pick this up each month .   Gastroesophageal reflux disease: Take omeprazole 20 mg daily, you can buy this from [employer]  Recurrent cough: Take the medications above these should help May use Phenergan with codeine briefly as this is addictive in nature and can cause you to be sleepy . We do not want to continue you this medication on regular basis so use cautiously .   Follow-up 4- 6 months with Dr. Lake Bells or sooner if needed

## 2018-10-22 NOTE — Progress Notes (Signed)
Virtual Visit via Video Note  I connected with Jerry Steele on 10/22/18 at  2:00 PM EDT by a video enabled telemedicine application and verified that I am speaking with the correct person using two identifiers.   I discussed the limitations of evaluation and management by telemedicine and the availability of in person appointments. The patient expressed understanding and agreed to proceed.  History of Present Illness: Today tele-visit is for a follow up for cough.  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  He was reminded to take his maintenance medication with Symbicort, Zyrtec, Prilosec and Flonase.  He says that his cough is some better however it continues to cause him trouble and keeps him up at night.  He is requesting his codeine cough syrup to be called and to help with this.  We had a long conversation regarding potential addictive qualities of codeine and potential side effects with sedation.  Advised that this is not a long-term medication and will be used very cautiously.  He verbalized understanding.  PMP was reviewed. Patient says cough is mainly dry in nature.  Has noticed that with the pollen this he has had more nasal symptoms with sinus drainage and nasal congestion.  He denies any fever, discolored mucus, chest pain or wheezing.  Says breathing is at baseline.  We discussed limit exposure to his birds.  Patient education given. We discussed coronavirus precautions.  He says he is home isolating and social distancing.  He does wear a mask if he is to go out into the public.       Observations/Objective: 03/2017 CXR reviewed, some bronchitis changes but otherwise normal, personally reviewed October 2018 CT chest: Normal pulmonary parenchyma, no evidence of lesion or mass, images independently reviewed CXR 07/2018 no acute process.    Spirometry: October 2018: Ratio 77%, FVC decreased, 53% predicted In November 2018 ratio 76%, FEV1 2.90 L 66% predicted, FVC 3.81 L 67% predicted, total lung capacity 5.84 L 75% predicted, DLCO 25.32 mL 67% predicted     +BD response  Labs :  Eosinophils ok. ESR ok . Mold allergen profile neg. Exhaled nitric oxide 11 , nicotine testing neg.  HIV neg 2010 ,2016   HSP - pigeon serum abx POSITIVE  Assessment and Plan: Flare of cyclical cough secondary to hypersensitivity pneumonitis, allergic rhinitis and emphysema-patient will continue on preventative regimen.  Decrease pigeon exposure as well.  Continue on trigger prevention.  We will give small amount of Phenergan with codeine cough syrup to help with severe coughing.  Patient education given on controlled substance.  Patient is aware we will not be given this on a regular basis.  And verbalizes understanding of this reason  Plan Patient Instructions  Allergic rhinitis Take cetirizine 10 mg daily no matter how you feel, you can buy this from [employer], this is over this is over the counter.  Take fluticasone 2 sprays each nostril daily, this is over the counter.  Use saline salt water rinses in your nose to help clear the mucus out  Recurrent cough with hypersensitivity pneumonitis and asthma: Take Symbicort 2 puffs twice a day no matter  how you feel, sample given today, you need to pick up the prescription for this or let us know if there is a more affordable option from your insurance Will need to pick this up each month .   Gastroesophageal reflux disease: Take omeprazole 20 mg daily, you can buy this from [employer]  Recurrent cough: Take the medications above these  should help May use Phenergan with codeine briefly as this is addictive in nature and can cause you to be sleepy . We do not want to continue you this medication on regular basis so use cautiously .   Follow-up 4- 6 months with Dr. Lake Bells or sooner if needed     Follow Up Instructions: Follow-up with Dr. Lake Bells in 4 to 6 months and as needed   I discussed the assessment and treatment plan with the patient. The patient was provided an opportunity to ask questions and all were answered. The patient agreed with the plan and demonstrated an understanding of the instructions.   The patient was advised to call back or seek an in-person evaluation if the symptoms worsen or if the condition fails to improve as anticipated.  I provided 23 minutes of non-face-to-face time during this encounter.   Rexene Edison, NP

## 2018-10-24 NOTE — Progress Notes (Signed)
Reviewed, agree 

## 2018-11-12 ENCOUNTER — Encounter: Payer: Self-pay | Admitting: Adult Health

## 2018-11-12 ENCOUNTER — Ambulatory Visit (INDEPENDENT_AMBULATORY_CARE_PROVIDER_SITE_OTHER): Payer: BLUE CROSS/BLUE SHIELD | Admitting: Adult Health

## 2018-11-12 ENCOUNTER — Other Ambulatory Visit: Payer: Self-pay

## 2018-11-12 DIAGNOSIS — J209 Acute bronchitis, unspecified: Secondary | ICD-10-CM

## 2018-11-12 DIAGNOSIS — J45901 Unspecified asthma with (acute) exacerbation: Secondary | ICD-10-CM | POA: Diagnosis not present

## 2018-11-12 DIAGNOSIS — J45909 Unspecified asthma, uncomplicated: Secondary | ICD-10-CM

## 2018-11-12 MED ORDER — PREDNISONE 10 MG PO TABS: ORAL_TABLET | ORAL | 0 refills | Status: DC

## 2018-11-12 MED ORDER — DOXYCYCLINE HYCLATE 100 MG PO TABS: 100.0000 mg | ORAL_TABLET | Freq: Two times a day (BID) | ORAL | 0 refills | Status: DC

## 2018-11-12 NOTE — Patient Instructions (Addendum)
Doxycycline 100mg  Twice daily for 1 week, take with food, wear sunscreen if going out in sun .  Prednisone taper over next week. Take with food.  Mucinex DM Twice daily  As needed  Cough/congestion  Continue on Symbicort 2 puffs Twice daily  , rinse after use.  Follow up with Dr. Lake Bells in 6-8 weeks and As needed   Please contact office for sooner follow up if symptoms do not improve or worsen or seek emergency care

## 2018-11-12 NOTE — Progress Notes (Signed)
Virtual Visit via Telephone Note  I connected with Jerry Steele on 11/12/18 at 11:30 AM EDT by telephone and verified that I am speaking with the correct person using two identifiers.   I discussed the limitations, risks, security and privacy concerns of performing an evaluation and management service by telephone and the availability of in person appointments. I also discussed with the patient that there may be a patient responsible charge related to this service. The patient expressed understanding and agreed to proceed.   History of Present Illness: Today's tele-visit is for an acute visit for cough Patient is present at home, myself is present at pulmonary clinic  Patient is a 53 year old male former smoker followed for asthma, chronic cough felt secondary to hypersensitivity pneumonitis (chronic in home multiple pigeon exposure with positive HSP panel), emphysema, allergic rhinitis, GERD.  Patient complains of 1 week of increased cough, congestion with thick green mucus . Has nasal congestion and drainage. Has intermittent wheezing .  Patient says he had no fever.  Has had no hemoptysis chest pain orthopnea PND or leg swelling.  He has had no recent travel.  Appetite is good with no nausea vomiting or diarrhea.  Patient remains on Symbicort twice daily.  Zyrtec 10 mg daily.  Prilosec daily.  Has been using Tessalon and codeine cough syrup for cough control.      Observations/Objective:  03/2017 CXR reviewed, some bronchitis changes but otherwise normal, personally reviewed October 2018 CT chest: Normal pulmonary parenchyma, no evidence of lesion or mass, images independently reviewed CXR 07/2018 no acute process.    Spirometry: October 2018: Ratio 77%, FVC decreased, 53% predicted In November 2018 ratio 76%, FEV1 2.90 L 66% predicted, FVC 3.81 L 67% predicted, total lung capacity 5.84 L 75% predicted, DLCO 25.32 mL 67% predicted+BD response  Labs :  Eosinophils ok.  ESR ok . Mold allergen profile neg. Exhaled nitric oxide 11 , nicotine testing neg.  HIV neg 2010 ,2016   HSP - pigeon serum abx POSITIVE   Assessment and Plan: Acute asthmatic bronchitis-flare   Plan  Patient Instructions  Doxycycline 170m Twice daily for 1 week, take with food, wear sunscreen if going out in sun .  Prednisone taper over next week. Take with food.  Mucinex DM Twice daily  As needed  Cough/congestion  Continue on Symbicort 2 puffs Twice daily  , rinse after use.  Follow up with Dr. MLake Bellsin 6-8 weeks and As needed   Please contact office for sooner follow up if symptoms do not improve or worsen or seek emergency care    '   Follow Up Instructions: Follow-up in 6 to 8 weeks and as needed   I discussed the assessment and treatment plan with the patient. The patient was provided an opportunity to ask questions and all were answered. The patient agreed with the plan and demonstrated an understanding of the instructions.   The patient was advised to call back or seek an in-person evaluation if the symptoms worsen or if the condition fails to improve as anticipated.  I provided 22  minutes of non-face-to-face time during this encounter.   TRexene Edison NP

## 2018-11-14 NOTE — Progress Notes (Signed)
Reviewed, agree 

## 2019-03-06 ENCOUNTER — Ambulatory Visit: Payer: BLUE CROSS/BLUE SHIELD | Admitting: Family Medicine

## 2019-03-07 ENCOUNTER — Encounter: Payer: Self-pay | Admitting: Family Medicine

## 2019-04-01 ENCOUNTER — Ambulatory Visit: Payer: BLUE CROSS/BLUE SHIELD | Admitting: Pulmonary Disease

## 2019-06-19 ENCOUNTER — Telehealth: Payer: Self-pay

## 2019-06-19 NOTE — Telephone Encounter (Signed)
Pt . Came into the office to request refill of sildenafil.

## 2019-06-24 ENCOUNTER — Other Ambulatory Visit: Payer: Self-pay | Admitting: Registered Nurse

## 2019-06-24 DIAGNOSIS — N529 Male erectile dysfunction, unspecified: Secondary | ICD-10-CM

## 2019-06-24 MED ORDER — SILDENAFIL CITRATE 20 MG PO TABS: ORAL_TABLET | ORAL | 11 refills | Status: DC

## 2019-06-24 NOTE — Telephone Encounter (Signed)
I have sent a refill over  Thank you  Denice Paradise

## 2019-06-24 NOTE — Telephone Encounter (Signed)
Pt has upcoming appt with you o 06/27/2019 and is requesitng sildenafil refill.  Please advise.

## 2019-06-26 NOTE — Telephone Encounter (Signed)
Interpreter # [phone] called pt an advised sildenafil medication sent over to pharmacy.  Pt verbalized understanding and appreciative.

## 2019-06-27 ENCOUNTER — Encounter: Payer: BLUE CROSS/BLUE SHIELD | Admitting: Registered Nurse

## 2019-09-03 ENCOUNTER — Encounter: Payer: Self-pay | Admitting: Registered Nurse

## 2019-09-09 ENCOUNTER — Encounter: Payer: Self-pay | Admitting: Registered Nurse

## 2019-12-08 ENCOUNTER — Ambulatory Visit: Payer: Self-pay | Admitting: Adult Health

## 2019-12-23 ENCOUNTER — Encounter: Payer: Self-pay | Admitting: Registered Nurse

## 2019-12-24 ENCOUNTER — Encounter: Payer: Self-pay | Admitting: Registered Nurse

## 2019-12-25 ENCOUNTER — Encounter: Payer: Self-pay | Admitting: General Practice

## 2020-01-06 ENCOUNTER — Ambulatory Visit: Payer: Self-pay | Admitting: Registered Nurse

## 2020-01-07 ENCOUNTER — Encounter: Payer: Self-pay | Admitting: Registered Nurse

## 2020-01-12 ENCOUNTER — Ambulatory Visit: Payer: Self-pay | Admitting: Registered Nurse

## 2020-01-13 ENCOUNTER — Encounter: Payer: Self-pay | Admitting: Registered Nurse

## 2020-07-09 ENCOUNTER — Other Ambulatory Visit: Payer: Self-pay | Admitting: Registered Nurse

## 2020-07-09 DIAGNOSIS — N529 Male erectile dysfunction, unspecified: Secondary | ICD-10-CM

## 2020-07-19 ENCOUNTER — Ambulatory Visit
Admission: EM | Admit: 2020-07-19 | Discharge: 2020-07-19 | Disposition: A | Discharge status: Home / Self Care | Payer: Self-pay | Attending: Emergency Medicine | Admitting: Emergency Medicine

## 2020-07-19 DIAGNOSIS — S161XXA Strain of muscle, fascia and tendon at neck level, initial encounter: Secondary | ICD-10-CM

## 2020-07-19 DIAGNOSIS — J441 Chronic obstructive pulmonary disease with (acute) exacerbation: Secondary | ICD-10-CM

## 2020-07-19 DIAGNOSIS — R059 Cough, unspecified: Secondary | ICD-10-CM

## 2020-07-19 HISTORY — DX: Emphysema, unspecified: J43.9

## 2020-07-19 MED ORDER — TIZANIDINE HCL 4 MG PO TABS: 4.0000 mg | ORAL_TABLET | Freq: Four times a day (QID) | ORAL | 0 refills | Status: DC | PRN

## 2020-07-19 MED ORDER — ALBUTEROL SULFATE HFA 108 (90 BASE) MCG/ACT IN AERS: 1.0000 | INHALATION_SPRAY | Freq: Four times a day (QID) | RESPIRATORY_TRACT | 0 refills | Status: DC | PRN

## 2020-07-19 MED ORDER — PREDNISONE 10 MG PO TABS: ORAL_TABLET | ORAL | 0 refills | Status: DC

## 2020-07-19 MED ORDER — DOXYCYCLINE HYCLATE 100 MG PO CAPS: 100.0000 mg | ORAL_CAPSULE | Freq: Two times a day (BID) | ORAL | 0 refills | Status: AC

## 2020-07-19 MED ORDER — DM-GUAIFENESIN ER 30-600 MG PO TB12: 1.0000 | ORAL_TABLET | Freq: Two times a day (BID) | ORAL | 0 refills | Status: DC

## 2020-07-19 MED ORDER — BUDESONIDE-FORMOTEROL FUMARATE 160-4.5 MCG/ACT IN AERO: 2.0000 | INHALATION_SPRAY | Freq: Two times a day (BID) | RESPIRATORY_TRACT | 5 refills | Status: DC

## 2020-07-19 MED ORDER — BENZONATATE 200 MG PO CAPS: 200.0000 mg | ORAL_CAPSULE | Freq: Three times a day (TID) | ORAL | 0 refills | Status: AC | PRN

## 2020-07-19 NOTE — ED Triage Notes (Signed)
Pt c/o cough x2-3 wks, worse at night. Hx of emphysema. States doesn't have a PCP anymore.

## 2020-07-19 NOTE — ED Notes (Signed)
Called pt, was not on premise, said he was on his way back.

## 2020-07-19 NOTE — Discharge Instructions (Signed)
Begin doxycycline twice daily for 1 week Prednisone taper over the next 6 days Tessalon for cough every 8 hours Mucinex DM twice daily for congestion/cough Continue albuterol inhaler as needed for shortness of breath chest tightness and wheezing Continue Symbicort as prescribed Rest and fluids  Continue Tylenol for neck pain, add in tizanidine which is a muscle relaxer as needed at home/bedtime, may cause some drowsiness Gentle stretching of neck Alternate ice and heat  Return if not improving or worsening Follow-up with primary care/pulmonology/urology for other concerns

## 2020-07-19 NOTE — ED Provider Notes (Signed)
[name]    CSN: [id number] Arrival date & time: 07/19/20  1353      History   Chief Complaint Chief Complaint  Patient presents with  . Cough    HPI Jerry Steele is a 55 y.o. male presenting today for evaluation of cough and neck pain.  Patient reports that he has had a cough for approximately 2 to 3 weeks.  Reports he has a history of emphysema feels as if he has had a flare of bronchitis.  Reports some wheezing and shortness of breath.  Previously seeing pulmonology and has inhalers at home.  He denies any fevers.  Does not currently have a PCP and has been between care.  Also reports history of prostate cancer and has not had follow-up in a while.  Expressing concerns regarding need for refills of medicines, including medicine for erectile dysfunction.  Also reports that he went to a chiropractor approximately 2 weeks ago.  Had manipulation in for 3 days after had neck stiffness.  Continues to have some tension and discomfort in his right neck.  HPI  Past Medical History:  Diagnosis Date  . Emphysema lung ([place])   . Pneumonia     Patient Active Problem List   Diagnosis Date Noted  . Acute bronchitis 08/30/2018  . Spell of dizziness 03/14/2018  . Viral upper respiratory illness 03/14/2018  . Cough variant asthma 11/27/2017  . Hypersensitivity pneumonitis ([place]) 09/06/2017  . Cough 06/27/2017  . Rib pain 06/27/2017  . Tobacco use disorder 05/16/2017    History reviewed. No pertinent surgical history.     Home Medications    Prior to Admission medications   Medication Sig Start Date End Date Taking? Authorizing Provider  albuterol (VENTOLIN HFA) 108 (90 Base) MCG/ACT inhaler Inhale 1-2 puffs into the lungs every 6 (six) hours as needed for wheezing or shortness of breath. 07/19/20  Yes Cheral Cappucci C, PA-C  benzonatate (TESSALON) 200 MG capsule Take 1 capsule (200 mg total) by mouth 3 (three) times daily as needed for up to 7 days for  cough. 07/19/20 07/26/20 Yes Stephanee Barcomb C, PA-C  dextromethorphan-guaiFENesin (MUCINEX DM) 30-600 MG 12hr tablet Take 1 tablet by mouth 2 (two) times daily. 07/19/20  Yes Hermione Havlicek C, PA-C  doxycycline (VIBRAMYCIN) 100 MG capsule Take 1 capsule (100 mg total) by mouth 2 (two) times daily for 7 days. 07/19/20 07/26/20 Yes Aaryana Betke C, PA-C  predniSONE (DELTASONE) 10 MG tablet Begin with 6 tabs on day 1, 5 tab on day 2, 4 tab on day 3, 3 tab on day 4, 2 tab on day 5, 1 tab on day 6-take with food 07/19/20  Yes Gurvir Schrom C, PA-C  tiZANidine (ZANAFLEX) 4 MG tablet Take 1 tablet (4 mg total) by mouth every 6 (six) hours as needed for muscle spasms (neck pain). 07/19/20  Yes Eliot Bencivenga C, PA-C  budesonide-formoterol (SYMBICORT) 160-4.5 MCG/ACT inhaler Inhale 2 puffs into the lungs 2 (two) times daily. 07/19/20   Nnenna Meador C, PA-C  omeprazole (PRILOSEC) 40 MG capsule Take 40 mg by mouth daily.    [provider]  sildenafil (REVATIO) 20 MG tablet TAKE 1-3 TABLETS BY MOUTH ONCE DAILY AS NEEDED 06/24/19   Maximiano Coss, NP    Family History History reviewed. No pertinent family history.  Social History Social History   Tobacco Use  . Smoking status: Former Smoker    Packs/day: 0.25    Years: 30.00    Pack years: 7.50  Types: Cigarettes    Quit date: 12/16/2016    Years since quitting: 3.5  . Smokeless tobacco: Never Used  Vaping Use  . Vaping Use: Never used  Substance Use Topics  . Alcohol use: No    Alcohol/week: 0.0 standard drinks  . Drug use: No     Allergies   Patient has no known allergies.   Review of Systems Review of Systems  Constitutional: Negative for activity change, appetite change, chills, fatigue and fever.  HENT: Negative for congestion, ear pain, rhinorrhea, sinus pressure, sore throat and trouble swallowing.   Eyes: Negative for discharge and redness.  Respiratory: Positive for cough, shortness of breath and wheezing. Negative for  chest tightness.   Cardiovascular: Negative for chest pain.  Gastrointestinal: Negative for abdominal pain, diarrhea, nausea and vomiting.  Musculoskeletal: Positive for back pain and myalgias.  Skin: Negative for rash.  Neurological: Negative for dizziness, light-headedness and headaches.     Physical Exam Triage Vital Signs ED Triage Vitals  Enc Vitals Group     BP      Pulse      Resp      Temp      Temp src      SpO2      Weight      Height      Head Circumference      Peak Flow      Pain Score      Pain Loc      Pain Edu?      Excl. in [place]?    No data found.  Updated Vital Signs BP 123/81 (BP Location: Left Arm)   Pulse 80   Temp 97.9 F (36.6 C) (Oral)   Resp 18   SpO2 96%   Visual Acuity Right Eye Distance:   Left Eye Distance:   Bilateral Distance:    Right Eye Near:   Left Eye Near:    Bilateral Near:     Physical Exam Vitals and nursing note reviewed.  Constitutional:      Appearance: He is well-developed and well-nourished.     Comments: No acute distress  HENT:     Head: Normocephalic and atraumatic.     Ears:     Comments: Bilateral ears without tenderness to palpation of external auricle, tragus and mastoid, EAC's without erythema or swelling, TM's with good bony landmarks and cone of light. Non erythematous.     Nose: Nose normal.     Mouth/Throat:     Comments: Oral mucosa pink and moist, no tonsillar enlargement or exudate. Posterior pharynx patent and nonerythematous, no uvula deviation or swelling. Normal phonation. Eyes:     Conjunctiva/sclera: Conjunctivae normal.  Neck:     Comments: Full active range of motion of the neck Cardiovascular:     Rate and Rhythm: Normal rate.  Pulmonary:     Effort: Pulmonary effort is normal. No respiratory distress.     Comments: Breathing comfortably at rest, CTABL, no wheezing, rales or other adventitious sounds auscultated Abdominal:     General: There is no distension.  Musculoskeletal:         General: Normal range of motion.     Cervical back: Neck supple.     Comments: Nontender to palpation of cervical spine midline, no palpable deformity or step-off, diffuse tenderness throughout right cervical musculature extending into superior trapezius area  Skin:    General: Skin is warm and dry.  Neurological:     Mental Status: He is  alert and oriented to person, place, and time.  Psychiatric:        Mood and Affect: Mood and affect normal.      UC Treatments / Results  Labs (all labs ordered are listed, but only abnormal results are displayed) Labs Reviewed - No data to display  EKG   Radiology No results found.  Procedures Procedures (including critical care time)  Medications Ordered in UC Medications - No data to display  Initial Impression / Assessment and Plan / UC Course  I have reviewed the triage vital signs and the nursing notes.  Pertinent labs & imaging results that were available during my care of the patient were reviewed by me and considered in my medical decision making (see chart for details).     1.  COPD exacerbation/emphysema-initiated on doxycycline, prednisone, Tessalon and Mucinex, continue inhalers  2.  Cervical strain-continue anti-inflammatories, will supplement with tizanidine.  3.  Med refills-stressed importance of establishing care with PCP for further concerns regarding history of prostate cancer, erectile dysfunction, etc.   Discussed strict return precautions. Patient verbalized understanding and is agreeable with plan.  Final Clinical Impressions(s) / UC Diagnoses   Final diagnoses:  COPD exacerbation (HCC)  Cough  Strain of neck muscle, initial encounter     Discharge Instructions     Begin doxycycline twice daily for 1 week Prednisone taper over the next 6 days Tessalon for cough every 8 hours Mucinex DM twice daily for congestion/cough Continue albuterol inhaler as needed for shortness of breath chest tightness  and wheezing Continue Symbicort as prescribed Rest and fluids  Continue Tylenol for neck pain, add in tizanidine which is a muscle relaxer as needed at home/bedtime, may cause some drowsiness Gentle stretching of neck Alternate ice and heat  Return if not improving or worsening Follow-up with primary care/pulmonology/urology for other concerns      ED Prescriptions    Medication Sig Dispense Auth. Provider   doxycycline (VIBRAMYCIN) 100 MG capsule Take 1 capsule (100 mg total) by mouth 2 (two) times daily for 7 days. 14 capsule Amoy Steeves C, PA-C   benzonatate (TESSALON) 200 MG capsule Take 1 capsule (200 mg total) by mouth 3 (three) times daily as needed for up to 7 days for cough. 28 capsule Ervie Mccard C, PA-C   predniSONE (DELTASONE) 10 MG tablet Begin with 6 tabs on day 1, 5 tab on day 2, 4 tab on day 3, 3 tab on day 4, 2 tab on day 5, 1 tab on day 6-take with food 21 tablet Kaily Wragg C, PA-C   dextromethorphan-guaiFENesin (MUCINEX DM) 30-600 MG 12hr tablet Take 1 tablet by mouth 2 (two) times daily. 20 tablet Jase Reep C, PA-C   albuterol (VENTOLIN HFA) 108 (90 Base) MCG/ACT inhaler Inhale 1-2 puffs into the lungs every 6 (six) hours as needed for wheezing or shortness of breath. 18 g Lerae Langham C, PA-C   budesonide-formoterol (SYMBICORT) 160-4.5 MCG/ACT inhaler Inhale 2 puffs into the lungs 2 (two) times daily. 1 each Nyzaiah Kai C, PA-C   tiZANidine (ZANAFLEX) 4 MG tablet Take 1 tablet (4 mg total) by mouth every 6 (six) hours as needed for muscle spasms (neck pain). 30 tablet Corneilus Heggie, [place] C, PA-C     PDMP not reviewed this encounter.   Lew Dawes, [place] 07/19/20 1745

## 2020-09-26 ENCOUNTER — Ambulatory Visit
Admission: EM | Admit: 2020-09-26 | Discharge: 2020-09-26 | Disposition: A | Discharge status: Home / Self Care | Payer: Self-pay | Attending: Emergency Medicine | Admitting: Emergency Medicine

## 2020-09-26 ENCOUNTER — Other Ambulatory Visit: Payer: Self-pay

## 2020-09-26 ENCOUNTER — Encounter: Payer: Self-pay | Admitting: *Deleted

## 2020-09-26 DIAGNOSIS — J441 Chronic obstructive pulmonary disease with (acute) exacerbation: Secondary | ICD-10-CM

## 2020-09-26 DIAGNOSIS — J019 Acute sinusitis, unspecified: Secondary | ICD-10-CM

## 2020-09-26 MED ORDER — PREDNISONE 20 MG PO TABS: 40.0000 mg | ORAL_TABLET | Freq: Every day | ORAL | 0 refills | Status: AC

## 2020-09-26 MED ORDER — AMOXICILLIN-POT CLAVULANATE 875-125 MG PO TABS: 1.0000 | ORAL_TABLET | Freq: Two times a day (BID) | ORAL | 0 refills | Status: DC

## 2020-09-26 NOTE — Discharge Instructions (Signed)
Use of over the counter medications such as mucinex d as needed.  Use of your inhalers as needed.  Prednisone as prescribed as well as course of augmentin.  Please establish with a primary care provider for long term management of your emphysema.

## 2020-09-26 NOTE — ED Provider Notes (Signed)
[name]    CSN: [id number] Arrival date & time: 09/26/20  1430      History   Chief Complaint Chief Complaint  Patient presents with  . Fever  . Cough  . Shortness of Breath    HPI Jerry Steele is a 55 y.o. male.   Jerry Steele presents with complaints of cough, shortness of breath, facial pressure and congestion and wheezing. Started around 2 weeks ago and has been worsening. Difficulty with sleeping at night due to symptoms. No known fevers. He states he has a history of emphysema and has recurrence of similar issues. Negative home covid testing. No gi symptoms. His son was ill initially. Doesn't have a PCP. States he still does have inhalers which he uses. Shortness of breath with exertion. No chest pain.     ROS per HPI, negative if not otherwise mentioned.      Past Medical History:  Diagnosis Date  . Emphysema lung ([place])   . Pneumonia     Patient Active Problem List   Diagnosis Date Noted  . Acute bronchitis 08/30/2018  . Spell of dizziness 03/14/2018  . Viral upper respiratory illness 03/14/2018  . Cough variant asthma 11/27/2017  . Hypersensitivity pneumonitis ([place]) 09/06/2017  . Cough 06/27/2017  . Rib pain 06/27/2017  . Tobacco use disorder 05/16/2017    History reviewed. No pertinent surgical history.     Home Medications    Prior to Admission medications   Medication Sig Start Date End Date Taking? Authorizing Provider  albuterol (VENTOLIN HFA) 108 (90 Base) MCG/ACT inhaler Inhale 1-2 puffs into the lungs every 6 (six) hours as needed for wheezing or shortness of breath. 07/19/20  Yes Wieters, Hallie C, PA-C  amoxicillin-clavulanate (AUGMENTIN) 875-125 MG tablet Take 1 tablet by mouth every 12 (twelve) hours. 09/26/20  Yes Augusto Gamble B, NP  omeprazole (PRILOSEC) 40 MG capsule Take 40 mg by mouth daily.   Yes [provider]  predniSONE (DELTASONE) 20 MG tablet Take 2 tablets (40 mg total) by  mouth daily with breakfast for 5 days. 09/26/20 10/01/20 Yes Naketa Daddario, Malachy Moan, NP  budesonide-formoterol (SYMBICORT) 160-4.5 MCG/ACT inhaler Inhale 2 puffs into the lungs 2 (two) times daily. 07/19/20   Wieters, Hallie C, PA-C  dextromethorphan-guaiFENesin (MUCINEX DM) 30-600 MG 12hr tablet Take 1 tablet by mouth 2 (two) times daily. 07/19/20   Wieters, Hallie C, PA-C  sildenafil (REVATIO) 20 MG tablet TAKE 1-3 TABLETS BY MOUTH ONCE DAILY AS NEEDED 06/24/19   Maximiano Coss, NP  tiZANidine (ZANAFLEX) 4 MG tablet Take 1 tablet (4 mg total) by mouth every 6 (six) hours as needed for muscle spasms (neck pain). 07/19/20   Wieters, Elesa Hacker, PA-C    Family History History reviewed. No pertinent family history.  Social History Social History   Tobacco Use  . Smoking status: Former Smoker    Packs/day: 0.25    Years: 30.00    Pack years: 7.50    Types: Cigarettes    Quit date: 12/16/2016    Years since quitting: 3.7  . Smokeless tobacco: Never Used  Vaping Use  . Vaping Use: Never used  Substance Use Topics  . Alcohol use: No    Alcohol/week: 0.0 standard drinks  . Drug use: No     Allergies   Patient has no known allergies.   Review of Systems Review of Systems   Physical Exam Triage Vital Signs ED Triage Vitals  Enc Vitals Group     BP 09/26/20 1440  129/85     Pulse Rate 09/26/20 1440 84     Resp 09/26/20 1440 (!) 22     Temp 09/26/20 1440 97.8 F (36.6 C)     Temp src --      SpO2 09/26/20 1440 96 %     Weight --      Height --      Head Circumference --      Peak Flow --      Pain Score 09/26/20 1442 0     Pain Loc --      Pain Edu? --      Excl. in [place]? --    No data found.  Updated Vital Signs BP 129/85   Pulse 84   Temp 97.8 F (36.6 C)   Resp (!) 22   SpO2 96%    Physical Exam Constitutional:      Appearance: He is well-developed.  HENT:     Head: Normocephalic and atraumatic.     Nose:     Right Sinus: Maxillary sinus tenderness and frontal sinus  tenderness present.     Left Sinus: Maxillary sinus tenderness and frontal sinus tenderness present.     Mouth/Throat:     Tonsils: No tonsillar exudate.  Cardiovascular:     Rate and Rhythm: Normal rate and regular rhythm.  Pulmonary:     Effort: Pulmonary effort is normal.     Breath sounds: Decreased breath sounds present.  Skin:    General: Skin is warm and dry.  Neurological:     Mental Status: He is alert and oriented to person, place, and time.      UC Treatments / Results  Labs (all labs ordered are listed, but only abnormal results are displayed) Labs Reviewed - No data to display  EKG   Radiology No results found.  Procedures Procedures (including critical care time)  Medications Ordered in UC Medications - No data to display  Initial Impression / Assessment and Plan / UC Course  I have reviewed the triage vital signs and the nursing notes.  Pertinent labs & imaging results that were available during my care of the patient were reviewed by me and considered in my medical decision making (see chart for details).     Two weeks of symptoms, facial pressure, decreased lung sounds. Opted to cover with augmentin as well as prednisone. Imaging deferred- afebrile, no work of breathing currently. Encouraged establish with pcp. Return precautions provided. Patient verbalized understanding and agreeable to plan.   Final Clinical Impressions(s) / UC Diagnoses   Final diagnoses:  COPD exacerbation ([place])  Acute sinusitis, recurrence not specified, unspecified location     Discharge Instructions     Use of over the counter medications such as mucinex d as needed.  Use of your inhalers as needed.  Prednisone as prescribed as well as course of augmentin.  Please establish with a primary care provider for long term management of your emphysema.    ED Prescriptions    Medication Sig Dispense Auth. Provider   amoxicillin-clavulanate (AUGMENTIN) 875-125 MG tablet  Take 1 tablet by mouth every 12 (twelve) hours. 14 tablet Augusto Gamble B, NP   predniSONE (DELTASONE) 20 MG tablet Take 2 tablets (40 mg total) by mouth daily with breakfast for 5 days. 10 tablet Zigmund Gottron, NP     PDMP not reviewed this encounter.   Zigmund Gottron, NP 09/26/20 [phone]

## 2020-09-26 NOTE — ED Triage Notes (Signed)
C/O cough, SOB, fevers, very runny nose x approx 2 weeks.  States has had negative Covid test.

## 2020-09-27 ENCOUNTER — Other Ambulatory Visit: Payer: Self-pay | Admitting: Registered Nurse

## 2020-09-27 DIAGNOSIS — N529 Male erectile dysfunction, unspecified: Secondary | ICD-10-CM

## 2020-10-11 ENCOUNTER — Ambulatory Visit (INDEPENDENT_AMBULATORY_CARE_PROVIDER_SITE_OTHER): Payer: Self-pay | Admitting: Family

## 2020-10-11 ENCOUNTER — Encounter: Payer: Self-pay | Admitting: Family

## 2020-10-11 ENCOUNTER — Other Ambulatory Visit: Payer: Self-pay

## 2020-10-11 VITALS — BP 156/97 | HR 69 | Ht 73.15 in | Wt 190.6 lb

## 2020-10-11 DIAGNOSIS — M25561 Pain in right knee: Secondary | ICD-10-CM

## 2020-10-11 DIAGNOSIS — J441 Chronic obstructive pulmonary disease with (acute) exacerbation: Secondary | ICD-10-CM

## 2020-10-11 DIAGNOSIS — M79642 Pain in left hand: Secondary | ICD-10-CM

## 2020-10-11 DIAGNOSIS — F32A Depression, unspecified: Secondary | ICD-10-CM

## 2020-10-11 DIAGNOSIS — Z7689 Persons encountering health services in other specified circumstances: Secondary | ICD-10-CM

## 2020-10-11 DIAGNOSIS — Z603 Acculturation difficulty: Secondary | ICD-10-CM

## 2020-10-11 DIAGNOSIS — J019 Acute sinusitis, unspecified: Secondary | ICD-10-CM

## 2020-10-11 DIAGNOSIS — M79672 Pain in left foot: Secondary | ICD-10-CM

## 2020-10-11 DIAGNOSIS — Z789 Other specified health status: Secondary | ICD-10-CM

## 2020-10-11 DIAGNOSIS — F419 Anxiety disorder, unspecified: Secondary | ICD-10-CM

## 2020-10-11 DIAGNOSIS — G8929 Other chronic pain: Secondary | ICD-10-CM

## 2020-10-11 MED ORDER — MOXIFLOXACIN HCL 400 MG PO TABS: 400.0000 mg | ORAL_TABLET | Freq: Every day | ORAL | 0 refills | Status: AC

## 2020-10-11 NOTE — Patient Instructions (Signed)
Return for annual physical examination, labs, and health maintenance. Arrive fasting meaning having had no food and/or nothing to drink for at least 8 hours prior to appointment.  Please take scheduled medications as normal. Thank you for choosing [facility] at [facility] for your medical home!    Jerry Steele was seen by Camillia Herter, NP today.   Jerry Steele's primary care provider is Camillia Herter, NP.   For the best care possible,  you should try to see Durene Fruits, NP whenever you come to clinic.   We look forward to seeing you again soon!  If you have any questions about your visit today,  please call us at [phone]  Or feel free to reach your provider via [place].     Dolor agudo de [employer] adultos Acute Knee Pain, Adult El dolor de rodilla puede tener muchas causas. A veces, el dolor de rodilla es repentino (agudo) y puede deberse a dao, hinchazn o irritacin de los msculos y tejidos que sostienen la rodilla. A menudo, el dolor desaparece solo con el tiempo y con reposo. Si el dolor no desaparece, pueden hacerse pruebas para hallar su causa. Siga estas instrucciones en su casa: Si tiene una rodillera o un dispositivo ortopdico:  Use la rodillera o el dispositivo ortopdico como se lo haya indicado el mdico. Quteselos solamente como se lo haya indicado el mdico.  Afljeselos si los dedos del pie: ? Hormiguean. ? Se adormecen. ? Se tornan fros y de [employer].  Mantngalos limpios.  Si la rodillera o el dispositivo ortopdico no son impermeables: ? No deje que se mojen. ? Cbralos con un envoltorio hermtico cuando tome un bao de inmersin o una ducha.   Actividad  Descanse la rodilla.  No haga cosas que le causen dolor o que lo intensifiquen.  Evite las actividades en las que ambos pies no estn en contacto con el piso al mismo tiempo (actividades de alto impacto). Algunos ejemplos son correr, Art therapist soga y  hacer saltos de tijera.  Trabaje con un fisioterapeuta para crear un programa de ejercicios seguros, como le haya indicado el mdico. Control del dolor, la rigidez y la hinchazn  Si se lo indican, aplique hielo sobre la rodilla. Para hacer esto: ? Si tiene una rodillera o un dispositivo ortopdico que se puede quitar, proceda como se lo haya indicado el mdico. ? Ponga el hielo en una bolsa plstica. ? Coloque una [employer] piel y Therapist, nutritional. ? Aplique el hielo durante 1minutos, 2 o 3veces por da. ? Retire el hielo si la piel se le pone de color rojo brillante. Esto es [employer]. Si no puede sentir dolor, calor o fro, tiene un mayor riesgo de que se dae la zona.  Si se lo indican, use una venda elstica para ejercer presin (compresin) en la rodilla lesionada.  Cuando est sentado o acostado, eleve la rodilla por encima del nivel del corazn.  Duerma con una almohada debajo de la rodilla.   Indicaciones generales  Use los medicamentos de venta libre y los recetados solamente como se lo haya indicado el mdico.  No fume ni consuma ningn producto que contenga nicotina o tabaco. Si necesita ayuda para dejar de consumir estos productos, consulte al mdico.  Si tiene sobrepeso, trabaje con su mdico y un experto en alimentos (nutricionista) para establecer metas para bajar de peso. El sobrepeso puede aumentar el dolor de rodilla.  Controle si hay algn cambio en  sus sntomas.  Cumpla con todas las visitas de seguimiento. Comunquese con un mdico si:  El dolor de rodilla no desaparece.  El dolor de rodilla cambia o [place].  Tiene fiebre junto con dolor de rodilla.  La rodilla est enrojecida o se siente caliente al tacto.  La rodilla le falla o se le queda trabada. Solicite ayuda de inmediato si:  La rodilla se le hincha, y la Counselling psychologist.  No puede mover la rodilla.  Tiene un dolor muy intenso en la rodilla que no se alivia con  analgsicos. Resumen  El dolor de rodilla puede tener muchas causas. A menudo, el dolor desaparece solo con el tiempo y con reposo.  El mdico puede hacerle estudios para Neurosurgeon la causa del dolor.  Controle si hay algn cambio en sus sntomas. [employer] dolor con descanso, medicamentos, actividad de poca intensidad y [place] de hielo.  Solicite ayuda de inmediato si no puede mover la rodilla o si el dolor de rodilla es muy intenso. Esta informacin no tiene Marine scientist el consejo del mdico. Asegrese de hacerle al mdico cualquier pregunta que tenga. Document Revised: 02/03/2020 Document Reviewed: 02/03/2020 Elsevier Patient Education  [place].

## 2020-10-11 NOTE — Progress Notes (Signed)
Subjective:    Jerry Steele - 55 y.o. male MRN [id number]  Date of birth: [birth year]  HPI  Jerry Steele is to establish care and hospital follow-up. Patient has a PMH significant for hypertensive pneumonitis, cough variant asthma, viral upper respiratory illness, acute bronchitis, tobacco use disorder, cough, rib pain, and spell of dizziness.   Current issues and/or concerns: 1. URGENT CARE FOLLOW-UP: Visit 09/26/2020 at [facility] per NP note: Two weeks of symptoms, facial pressure, decreased lung sounds. Opted to cover with augmentin as well as prednisone. Imaging deferred- afebrile, no work of breathing currently. Encouraged establish with pcp. Return precautions provided. Patient verbalized understanding and agreeable to plan.   10/11/2020: Time since discharge: 15 days Hospital/facility: [facility] at [facility] Diagnosis: COPD exacerbation, acute sinusitis  Procedures/tests: none Consultants: none New medications: Amoxicillin-Clavulanate, Prednisone Discharge instructions: follow-up with PCP    Status: Today reports sinus issues are continuing, pain in the head, mucus, phlegm, nose spray and saline does not help. Coughing is all day. Has to breathe with mouth which creates dry mouth. At nighttime difficult to lay and sleep related to congestion. Using deep breathing to help. COPD iis stable and being followed by Pulmonology.  2. 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 Reports has seen Orthopedics before and advised to take Ibuprofen.    3. LEFT HEEL PAIN: Began 6 months ago. Lasts all day. Denies injury and trauma.  4. ANXIETY: Anxiety primarily related to concerns about his son. Reports his son is not caring difficult to speak with. Declines medication and counseling sessions at this time. Reports his family and  friends are people he discusses personal matters with. Denies thoughts of self-harm, suicidal ideations, homicidal ideations, and does not have any plans.  Depression screen [facility] 2/9 10/11/2020 08/20/2018 07/06/2018 04/03/2018 10/11/2017  Decreased Interest 1 0 0 0 0  Down, Depressed, Hopeless 3 0 0 0 0  PHQ - 2 Score 4 0 0 0 0  Altered sleeping 3 - - - -  Tired, decreased energy 1 - - - -  Change in appetite 0 - - - -  Feeling bad or failure about yourself  1 - - - -  Trouble concentrating 1 - - - -  Moving slowly or fidgety/restless 0 - - - -  Suicidal thoughts 1 - - - -  PHQ-9 Score 11 - - - -  Difficult doing work/chores Somewhat difficult - - - -     ROS per HPI    Health Maintenance:  Health Maintenance Due  Topic Date Due  . TETANUS/TDAP  Never done  . COLONOSCOPY (Pts 45-12yrs Insurance coverage will need to be confirmed)  Never done  . INFLUENZA VACCINE  Never done  . COVID-19 Vaccine (3 - Booster for [employer] series) 09/09/2020    Past Medical History: Patient Active Problem List   Diagnosis Date Noted  . Acute bronchitis 08/30/2018  . Spell of dizziness 03/14/2018  . Viral upper respiratory illness 03/14/2018  . Cough variant asthma 11/27/2017  . Hypersensitivity pneumonitis ([place]) 09/06/2017  . Cough 06/27/2017  . Rib pain 06/27/2017  . Tobacco use disorder 05/16/2017    Social History   reports that he quit smoking about 3 years ago. His smoking use included cigarettes. He has a 7.50 pack-year smoking history. He has never used smokeless tobacco. He reports that  he does not drink alcohol and does not use drugs.   Family History  family history is not on file.   Medications: reviewed and updated   Objective:   Physical Exam BP (!) 156/97 (BP Location: Left Arm, Patient Position: Sitting)   Pulse 69   Ht 6' 1.15" (1.858 m)   Wt 190 lb 9.6 oz (86.5 kg)   SpO2 98%   BMI 25.04 kg/m  Physical Exam HENT:     Head: Normocephalic and atraumatic.  Eyes:      Extraocular Movements: Extraocular movements intact.     Conjunctiva/sclera: Conjunctivae normal.     Pupils: Pupils are equal, round, and reactive to light.  Cardiovascular:     Rate and Rhythm: Normal rate and regular rhythm.     Pulses: Normal pulses.     Heart sounds: Normal heart sounds.  Pulmonary:     Effort: Pulmonary effort is normal.     Breath sounds: Normal breath sounds.  Neurological:     General: No focal deficit present.     Mental Status: He is alert and oriented to person, place, and time.  Psychiatric:        Mood and Affect: Mood normal.        Behavior: Behavior normal.        Assessment & Plan:  1. Encounter to establish care: - Patient presents today to establish care.  - Return for annual physical examination, labs, and health maintenance. Arrive fasting meaning having had no food and/or nothing to drink for at least 8 hours prior to appointment.  Please take scheduled medications as normal.  2. COPD exacerbation ([place]): - Stable.  - Continue Albuterol and Symbicort as prescribed.  - Follow-up with Pulmonology as scheduled.  3. Acute sinusitis, recurrence not specified, unspecified location: - Moxifloxacin as prescribed. - Follow-up with primary provider in 7 to 10 days if symptoms do not improve or worsen. Patient verbalized understanding.  - moxifloxacin (AVELOX) 400 MG tablet; Take 1 tablet (400 mg total) by mouth daily at 8 pm for 7 days.  Dispense: 7 tablet; Refill: 0  4. Hand pain, left: - Chronic. - Referral to Orthopedic Surgery for further evaluation and management.  - Ambulatory referral to Orthopedic Surgery  5. Chronic pain of right knee: - Chronic. - Referral to Orthopedic Surgery for further evaluation and management. - Ambulatory referral to Orthopedic Surgery  6. Chronic heel pain, left: - Referral to Podiatry for further evaluation and management. - Ambulatory referral to Podiatry  7. Anxiety and depression: - Stable.  - Denies  thoughts of self-harm, suicidal ideations, and homicidal ideations.  - Declined pharmacological and counseling services. - Follow-up with primary provider as scheduled.   8. Language barrier: - Patient declined interpretation services during today's visit.    Patient was given clear instructions to go to Emergency Department or return to medical center if symptoms don't improve, worsen, or new problems develop.The patient verbalized understanding.  I discussed the assessment and treatment plan with the patient. The patient was provided an opportunity to ask questions and all were answered. The patient agreed with the plan and demonstrated an understanding of the instructions.   The patient was advised to call back or seek an in-person evaluation if the symptoms worsen or if the condition fails to improve as anticipated.    Durene Fruits, NP 10/11/2020, 8:38 PM Primary Care at [facility]

## 2020-10-11 NOTE — Progress Notes (Signed)
Establish care Follow up from ED on 3/13 -SOB Phelgm still present Right knee pain  Left heel pain makes it hard to walk

## 2020-10-18 ENCOUNTER — Other Ambulatory Visit: Payer: Self-pay | Admitting: Registered Nurse

## 2020-10-18 ENCOUNTER — Telehealth (INDEPENDENT_AMBULATORY_CARE_PROVIDER_SITE_OTHER): Payer: Self-pay | Admitting: Family

## 2020-10-18 VITALS — BP 127/79 | HR 83 | Ht 73.15 in | Wt 183.6 lb

## 2020-10-18 DIAGNOSIS — Z1211 Encounter for screening for malignant neoplasm of colon: Secondary | ICD-10-CM

## 2020-10-18 DIAGNOSIS — K921 Melena: Secondary | ICD-10-CM

## 2020-10-18 DIAGNOSIS — N529 Male erectile dysfunction, unspecified: Secondary | ICD-10-CM

## 2020-10-18 NOTE — Patient Instructions (Signed)
   Referral to GI.  Rectal Bleeding  Rectal bleeding is when blood passes out of the opening between the buttocks (anus). People with rectal bleeding may notice bright red blood in their underwear or in the toilet after having a bowel movement. They may also have blood mixed with their stool (feces), or dark red or black stools. Rectal bleeding is usually a sign that something is wrong. Many things can cause rectal bleeding, including:  Diverticulosis. This is a condition in which pockets or sacs project from the bowel.  Hemorrhoids. These are blood vessels around the anus or inside the rectum that are larger than normal.  Anal fissures. This is a tear in the anus.  Proctitis and colitis. These are conditions in which the rectum, colon, or anus become inflamed.  Polyps. These are growths that can be cancerous (malignant) or noncancerous (benign).  Infections of the intestines.  Fistulas. These are abnormal openings in the rectum and anus.  Rectal prolapse. This is when a part of the rectum sticks out from the anus. Follow these instructions at home: Pay attention to any changes in your symptoms. Take these actions to help reduce bleeding and discomfort: Medicines  Take over-the-counter and prescription medicines only as told by your health care provider.  Ask your health care provider about changing or stopping your regular medicines or supplements. This is especially important if you are taking blood thinners. Medicines that thin the blood can make rectal bleeding worse. Managing constipation Your condition may cause constipation. To prevent or treat constipation, or to help make your stools soft, you may need to:  Drink enough fluid to keep your urine pale yellow.  Take over-the-counter or prescription medicines.  Eat foods that are high in fiber, such as beans, whole grains, and fresh fruits and vegetables. Ask your health care provider if you need a supplement to give you more  fiber.  Limit foods that are high in fat and processed sugars, such as fried or sweet foods.   General instructions  Try not to strain when having a bowel movement.  Try taking a warm bath. This may help to soothe any pain in your rectum.  Keep all follow-up visits as told by your health care provider. This is important. Contact a health care provider if you:  Have pain or tenderness in your abdomen.  Have a fever.  Have weakness.  Have nausea.  Cannot have a bowel movement. Get help right away if you have:  New or increased rectal bleeding.  Black or dark red stools.  Vomit with blood or something that looks like coffee grounds.  A fainting episode.  Severe pain in your rectum. Summary  Rectal bleeding is usually a sign that something is wrong. This condition should be evaluated by a health care provider.  Eat a diet that is high in fiber. This will help keep your stools soft, making it easier to pass stools without straining.  Medicines that thin the blood can make rectal bleeding worse.  Get help right away if you have new or increased rectal bleeding, black or dark red stools, blood in your vomit, an episode of fainting, or severe pain in your rectum. This information is not intended to replace advice given to you by your health care provider. Make sure you discuss any questions you have with your health care provider. Document Revised: 06/04/2019 Document Reviewed: 06/04/2019 Elsevier Patient Education  2021 [employer].

## 2020-10-18 NOTE — Progress Notes (Signed)
Patient ID: Jerry Steele, male    DOB: Jun 18, 1966  MRN: [id number]  CC: Blood In Stools  Subjective: Jerry Steele is a 55 y.o. male who presents for blood in stool. His concerns today include:   1. BLOOD IN STOOL: Reports minimal blood in stool began 2 days ago. Has not had any blood in the stool since yesterday. Denies constipation and straining during bowel movements. Had a similar occurrence years ago and was not evaluated at that time. Normal bowel movement for him is every other day.  2. ERECTILE DYSFUNCTION FOLLOW-UP: Requesting refills of Sildenafil.  Patient Active Problem List   Diagnosis Date Noted  . Acute bronchitis 08/30/2018  . Spell of dizziness 03/14/2018  . Viral upper respiratory illness 03/14/2018  . Cough variant asthma 11/27/2017  . Hypersensitivity pneumonitis ([place]) 09/06/2017  . Cough 06/27/2017  . Rib pain 06/27/2017  . Tobacco use disorder 05/16/2017     Current Outpatient Medications on File Prior to Visit  Medication Sig Dispense Refill  . albuterol (VENTOLIN HFA) 108 (90 Base) MCG/ACT inhaler Inhale 1-2 puffs into the lungs every 6 (six) hours as needed for wheezing or shortness of breath. 18 g 0  . budesonide-formoterol (SYMBICORT) 160-4.5 MCG/ACT inhaler Inhale 2 puffs into the lungs 2 (two) times daily. 1 each 5  . moxifloxacin (AVELOX) 400 MG tablet Take 1 tablet (400 mg total) by mouth daily at 8 pm for 7 days. 7 tablet 0  . sildenafil (REVATIO) 20 MG tablet TAKE 1-3 TABLETS BY MOUTH ONCE DAILY AS NEEDED 30 tablet 11   No current facility-administered medications on file prior to visit.    No Known Allergies  Social History   Socioeconomic History  . Marital status: Single    Spouse name: Not on file  . Number of children: Not on file  . Years of education: Not on file  . Highest education level: Not on file  Occupational History  . Not on file  Tobacco Use  . Smoking status: Former Smoker    Packs/day: 0.25     Years: 30.00    Pack years: 7.50    Types: Cigarettes    Quit date: 12/16/2016    Years since quitting: 3.8  . Smokeless tobacco: Never Used  Vaping Use  . Vaping Use: Never used  Substance and Sexual Activity  . Alcohol use: No    Alcohol/week: 0.0 standard drinks  . Drug use: No  . Sexual activity: Yes  Other Topics Concern  . Not on file  Social History Narrative  . Not on file   Social Determinants of Health   Financial Resource Strain: Not on file  Food Insecurity: Not on file  Transportation Needs: Not on file  Physical Activity: Not on file  Stress: Not on file  Social Connections: Not on file  Intimate Partner Violence: Not on file    No family history on file.  No past surgical history on file.  ROS: Review of Systems Negative except as stated above  PHYSICAL EXAM: BP 127/79 (BP Location: Left Arm, Patient Position: Sitting)   Pulse 83   Ht 6' 1.15" (1.858 m)   Wt 183 lb 9.6 oz (83.3 kg)   SpO2 95%   BMI 24.12 kg/m   Physical Exam Eyes:     Extraocular Movements: Extraocular movements intact.     Conjunctiva/sclera: Conjunctivae normal.     Pupils: Pupils are equal, round, and reactive to light.  Cardiovascular:     Rate and  Rhythm: Normal rate and regular rhythm.     Pulses: Normal pulses.     Heart sounds: Normal heart sounds.  Pulmonary:     Effort: Pulmonary effort is normal.     Breath sounds: Normal breath sounds.  Abdominal:     General: Bowel sounds are normal.     Palpations: Abdomen is soft.  Musculoskeletal:     Cervical back: Normal range of motion and neck supple.  Neurological:     General: No focal deficit present.     Mental Status: He is alert and oriented to person, place, and time.  Psychiatric:        Mood and Affect: Mood normal.        Behavior: Behavior normal.     ASSESSMENT AND PLAN: 1. Hematochezia: 2. Colon cancer screening: - Referral to Gastroenterology for colon cancer screening by colonoscopy. -  Ambulatory referral to Gastroenterology  3. Erectile dysfunction, unspecified erectile dysfunction type: - Continue Sildenafil as prescribed.  - Follow-up with primary provider as scheduled.  - sildenafil (REVATIO) 20 MG tablet; Take 1 tablet 1/2 hour to 1 hour prior to intercourse as needed. Limit use to 1/2 tablet or 1 tablet per 24 hours.  Dispense: 30 tablet; Refill: 2   Patient was given the opportunity to ask questions.  Patient verbalized understanding of the plan and was able to repeat key elements of the plan. Patient was given clear instructions to go to Emergency Department or return to medical center if symptoms don't improve, worsen, or new problems develop.The patient verbalized understanding.   No orders of the defined types were placed in this encounter.    Requested Prescriptions    No prescriptions requested or ordered in this encounter    Follow-up with primary provider as scheduled.    Camillia Herter, NP

## 2020-10-18 NOTE — Progress Notes (Signed)
Blood in stool for 2 days

## 2020-10-19 ENCOUNTER — Telehealth: Payer: Self-pay | Admitting: Family

## 2020-10-19 MED ORDER — SILDENAFIL CITRATE 20 MG PO TABS: ORAL_TABLET | ORAL | 2 refills | Status: DC

## 2020-10-19 NOTE — Telephone Encounter (Signed)
Pt request SILDENAFIL sent to [facility] on [address]. Pt states labs were OK to prescribe again. Pt ph [phone]

## 2020-10-19 NOTE — Telephone Encounter (Signed)
Sildenafil refilled per patient request.

## 2020-10-25 NOTE — Progress Notes (Deleted)
Patient ID: Jerry Steele, male    DOB: Nov 13, 1965  MRN: [id number]  CC: No chief complaint on file.   Subjective: Jerry Steele is a 55 y.o. male who presents for His concerns today include: ***  Patient Active Problem List   Diagnosis Date Noted  . Acute bronchitis 08/30/2018  . Spell of dizziness 03/14/2018  . Viral upper respiratory illness 03/14/2018  . Cough variant asthma 11/27/2017  . Hypersensitivity pneumonitis ([place]) 09/06/2017  . Cough 06/27/2017  . Rib pain 06/27/2017  . Tobacco use disorder 05/16/2017     Current Outpatient Medications on File Prior to Visit  Medication Sig Dispense Refill  . albuterol (VENTOLIN HFA) 108 (90 Base) MCG/ACT inhaler Inhale 1-2 puffs into the lungs every 6 (six) hours as needed for wheezing or shortness of breath. 18 g 0  . budesonide-formoterol (SYMBICORT) 160-4.5 MCG/ACT inhaler Inhale 2 puffs into the lungs 2 (two) times daily. 1 each 5  . sildenafil (REVATIO) 20 MG tablet Take 1 tablet 1/2 hour to 1 hour prior to intercourse as needed. Limit use to 1/2 tablet or 1 tablet per 24 hours. 30 tablet 2   No current facility-administered medications on file prior to visit.    No Known Allergies  Social History   Socioeconomic History  . Marital status: Single    Spouse name: Not on file  . Number of children: Not on file  . Years of education: Not on file  . Highest education level: Not on file  Occupational History  . Not on file  Tobacco Use  . Smoking status: Former Smoker    Packs/day: 0.25    Years: 30.00    Pack years: 7.50    Types: Cigarettes    Quit date: 12/16/2016    Years since quitting: 3.8  . Smokeless tobacco: Never Used  Vaping Use  . Vaping Use: Never used  Substance and Sexual Activity  . Alcohol use: No    Alcohol/week: 0.0 standard drinks  . Drug use: No  . Sexual activity: Yes  Other Topics Concern  . Not on file  Social History Narrative  . Not on file   Social  Determinants of Health   Financial Resource Strain: Not on file  Food Insecurity: Not on file  Transportation Needs: Not on file  Physical Activity: Not on file  Stress: Not on file  Social Connections: Not on file  Intimate Partner Violence: Not on file    No family history on file.  No past surgical history on file.  ROS: Review of Systems Negative except as stated above  PHYSICAL EXAM: There were no vitals taken for this visit.  Physical Exam  {male adult master:310786} {male adult master:310785}  CMP Latest Ref Rng & Units 03/14/2018 03/07/2017 06/13/2015  Glucose 70 - 99 mg/dL 93 91 79  BUN 6 - 23 mg/dL 19 19 12   Creatinine 0.40 - 1.50 mg/dL 0.98 1.01 0.88  Sodium 135 - 145 mEq/L 138 139 140  Potassium 3.5 - 5.1 mEq/L 4.0 4.7 4.1  Chloride 96 - 112 mEq/L 101 100 105  CO2 19 - 32 mEq/L 33(H) 25 29  Calcium 8.4 - 10.5 mg/dL 9.3 9.5 8.6  Total Protein 6.0 - 8.5 g/dL - 7.3 7.1  Total Bilirubin 0.0 - 1.2 mg/dL - 0.3 0.6  Alkaline Phos 39 - 117 IU/L - 80 75  AST 0 - 40 IU/L - 18 15  ALT 0 - 44 IU/L - 17 14   Lipid Panel  Component Value Date/Time   CHOL 206 (H) 03/31/2016 1417   TRIG 210 (H) 03/31/2016 1417   HDL 40 03/31/2016 1417   CHOLHDL 5.2 (H) 03/31/2016 1417   VLDL 42 (H) 03/31/2016 1417   LDLCALC 124 03/31/2016 1417    CBC    Component Value Date/Time   WBC 9.2 03/14/2018 1112   RBC 4.99 03/14/2018 1112   HGB 15.1 03/14/2018 1112   HGB 16.1 03/07/2017 1112   HCT 45.0 03/14/2018 1112   HCT 47.0 03/07/2017 1112   PLT 167.0 03/14/2018 1112   PLT 185 03/07/2017 1112   MCV 90.0 03/14/2018 1112   MCV 91 03/07/2017 1112   MCH 31.1 03/07/2017 1112   MCH 31.6 (A) 10/08/2015 1050   MCHC 33.6 03/14/2018 1112   RDW 13.3 03/14/2018 1112   RDW 13.7 03/07/2017 1112   LYMPHSABS 1.8 03/14/2018 1112   LYMPHSABS 2.1 03/07/2017 1112   MONOABS 0.6 03/14/2018 1112   EOSABS 0.3 03/14/2018 1112   EOSABS 0.1 03/07/2017 1112   BASOSABS 0.1 03/14/2018 1112    BASOSABS 0.0 03/07/2017 1112    ASSESSMENT AND PLAN:  There are no diagnoses linked to this encounter.   Patient was given the opportunity to ask questions.  Patient verbalized understanding of the plan and was able to repeat key elements of the plan. Patient was given clear instructions to go to Emergency Department or return to medical center if symptoms don't improve, worsen, or new problems develop.The patient verbalized understanding.   No orders of the defined types were placed in this encounter.    Requested Prescriptions    No prescriptions requested or ordered in this encounter    No follow-ups on file.  Camillia Herter, NP

## 2020-10-26 ENCOUNTER — Encounter: Payer: Self-pay | Admitting: Gastroenterology

## 2020-10-26 ENCOUNTER — Encounter: Payer: Self-pay | Admitting: Family

## 2020-11-04 ENCOUNTER — Inpatient Hospital Stay: Payer: Self-pay | Admitting: Physician Assistant

## 2020-11-05 ENCOUNTER — Ambulatory Visit: Payer: Self-pay | Admitting: Gastroenterology

## 2020-11-11 ENCOUNTER — Ambulatory Visit
Admission: EM | Admit: 2020-11-11 | Discharge: 2020-11-11 | Disposition: A | Discharge status: Home / Self Care | Payer: Self-pay | Attending: Internal Medicine | Admitting: Internal Medicine

## 2020-11-11 ENCOUNTER — Other Ambulatory Visit: Payer: Self-pay

## 2020-11-11 DIAGNOSIS — K529 Noninfective gastroenteritis and colitis, unspecified: Secondary | ICD-10-CM

## 2020-11-11 DIAGNOSIS — K625 Hemorrhage of anus and rectum: Secondary | ICD-10-CM

## 2020-11-11 LAB — POCT URINALYSIS DIP (MANUAL ENTRY)
Bilirubin, UA: NEGATIVE
Blood, UA: NEGATIVE
Glucose, UA: NEGATIVE mg/dL
Ketones, POC UA: NEGATIVE mg/dL
Leukocytes, UA: NEGATIVE
Nitrite, UA: NEGATIVE
Protein Ur, POC: NEGATIVE mg/dL
Spec Grav, UA: >=1.03 — AB (ref 1.010–1.025)
Urobilinogen, UA: 0.2 E.U./dL
pH, UA: 5.5 (ref 5.0–8.0)

## 2020-11-11 MED ORDER — HYDROCORTISONE ACETATE 25 MG RE SUPP: 25.0000 mg | Freq: Two times a day (BID) | RECTAL | 0 refills | Status: DC

## 2020-11-11 MED ORDER — METRONIDAZOLE 500 MG PO TABS: 500.0000 mg | ORAL_TABLET | Freq: Three times a day (TID) | ORAL | 0 refills | Status: AC

## 2020-11-11 MED ORDER — LIDOCAINE 5 % EX OINT: 1.0000 "application " | TOPICAL_OINTMENT | CUTANEOUS | 0 refills | Status: DC | PRN

## 2020-11-11 MED ORDER — CIPROFLOXACIN HCL 500 MG PO TABS: 500.0000 mg | ORAL_TABLET | Freq: Two times a day (BID) | ORAL | 0 refills | Status: DC

## 2020-11-11 NOTE — Discharge Instructions (Addendum)
Please use medications as prescribed If you have worsening abdominal pain, please go to the emergency room to be evaluated febrile We will call you with recommendations if your labs are abnormal Increase fiber intake Increase oral fluid intake.

## 2020-11-11 NOTE — ED Triage Notes (Signed)
Seen by provider prior to triage 

## 2020-11-12 LAB — CBC WITH DIFFERENTIAL/PLATELET
Basophils Absolute: 0.1 10*3/uL (ref 0.0–0.2)
Basos: 1 %
EOS (ABSOLUTE): 0.3 10*3/uL (ref 0.0–0.4)
Eos: 3 %
Hematocrit: 44.8 % (ref 37.5–51.0)
Hemoglobin: 15.2 g/dL (ref 13.0–17.7)
Immature Grans (Abs): 0 10*3/uL (ref 0.0–0.1)
Immature Granulocytes: 0 %
Lymphocytes Absolute: 1.9 10*3/uL (ref 0.7–3.1)
Lymphs: 20 %
MCH: 30.5 pg (ref 26.6–33.0)
MCHC: 33.9 g/dL (ref 31.5–35.7)
MCV: 90 fL (ref 79–97)
Monocytes Absolute: 0.7 10*3/uL (ref 0.1–0.9)
Monocytes: 7 %
Neutrophils Absolute: 6.7 10*3/uL (ref 1.4–7.0)
Neutrophils: 69 %
Platelets: 221 10*3/uL (ref 150–450)
RBC: 4.98 x10E6/uL (ref 4.14–5.80)
RDW: 12.8 % (ref 11.6–15.4)
WBC: 9.7 10*3/uL (ref 3.4–10.8)

## 2020-11-12 NOTE — ED Provider Notes (Signed)
[facility]    CSN: [id number] Arrival date & time: 11/11/20  1557      History   Chief Complaint Chief Complaint  Patient presents with  . Rectal Bleeding    HPI Jerry Steele is a 55 y.o. male to the urgent care with complaints of lower abdominal pain and rectal pain over the past week.  Patient says the abdominal pain is crampy, mainly in the lower abdomen. Patient denies any fever or chills.  He started a couple of bloody bowel movements over the past week.  Patient has abdominal bloating but no abdominal distention.  No nausea or vomiting.  No history of constipation. No history of diverticulitis or diverticulosis.  Patient is scheduled for colonoscopy in the next couple of weeks.  He denies any weight loss.  HPI  Past Medical History:  Diagnosis Date  . Emphysema lung ([place])   . Pneumonia     Patient Active Problem List   Diagnosis Date Noted  . Acute bronchitis 08/30/2018  . Spell of dizziness 03/14/2018  . Viral upper respiratory illness 03/14/2018  . Cough variant asthma 11/27/2017  . Hypersensitivity pneumonitis ([place]) 09/06/2017  . Cough 06/27/2017  . Rib pain 06/27/2017  . Tobacco use disorder 05/16/2017    History reviewed. No pertinent surgical history.     Home Medications    Prior to Admission medications   Medication Sig Start Date End Date Taking? Authorizing Provider  ciprofloxacin (CIPRO) 500 MG tablet Take 1 tablet (500 mg total) by mouth every 12 (twelve) hours. 11/11/20  Yes Ambert Virrueta, Myrene Galas, MD  hydrocortisone (ANUSOL-HC) 25 MG suppository Place 1 suppository (25 mg total) rectally 2 (two) times daily. 11/11/20  Yes Krislyn Donnan, Myrene Galas, MD  lidocaine (XYLOCAINE) 5 % ointment Apply 1 application topically as needed. 11/11/20  Yes Adelayde Minney, Myrene Galas, MD  metroNIDAZOLE (FLAGYL) 500 MG tablet Take 1 tablet (500 mg total) by mouth 3 (three) times daily for 7 days. 11/11/20 11/18/20 Yes Gadiel John, Myrene Galas, MD  albuterol (VENTOLIN HFA)  108 (90 Base) MCG/ACT inhaler Inhale 1-2 puffs into the lungs every 6 (six) hours as needed for wheezing or shortness of breath. 07/19/20   Wieters, Hallie C, PA-C  budesonide-formoterol (SYMBICORT) 160-4.5 MCG/ACT inhaler Inhale 2 puffs into the lungs 2 (two) times daily. 07/19/20   Wieters, Hallie C, PA-C  sildenafil (REVATIO) 20 MG tablet Take 1 tablet 1/2 hour to 1 hour prior to intercourse as needed. Limit use to 1/2 tablet or 1 tablet per 24 hours. 10/19/20   Camillia Herter, NP    Family History History reviewed. No pertinent family history.  Social History Social History   Tobacco Use  . Smoking status: Former Smoker    Packs/day: 0.25    Years: 30.00    Pack years: 7.50    Types: Cigarettes    Quit date: 12/16/2016    Years since quitting: 3.9  . Smokeless tobacco: Never Used  Vaping Use  . Vaping Use: Never used  Substance Use Topics  . Alcohol use: No    Alcohol/week: 0.0 standard drinks  . Drug use: No     Allergies   Patient has no known allergies.   Review of Systems Review of Systems  Constitutional: Negative for chills, fever and unexpected weight change.  Respiratory: Negative.   Gastrointestinal: Positive for abdominal pain, blood in stool and rectal pain. Negative for constipation, nausea and vomiting.  Genitourinary: Negative.   Musculoskeletal: Negative.   Neurological: Negative.  Physical Exam Triage Vital Signs ED Triage Vitals  Enc Vitals Group     BP 11/11/20 1810 126/80     Pulse Rate 11/11/20 1810 64     Resp 11/11/20 1810 20     Temp 11/11/20 1810 98.1 F (36.7 C)     Temp Source 11/11/20 1810 Oral     SpO2 11/11/20 1810 98 %     Weight --      Height --      Head Circumference --      Peak Flow --      Pain Score 11/11/20 1837 0     Pain Loc --      Pain Edu? --      Excl. in [place]? --    No data found.  Updated Vital Signs BP 126/80 (BP Location: Left Arm)   Pulse 64   Temp 98.1 F (36.7 C) (Oral)   Resp 20   SpO2 98%    Visual Acuity Right Eye Distance:   Left Eye Distance:   Bilateral Distance:    Right Eye Near:   Left Eye Near:    Bilateral Near:     Physical Exam Vitals and nursing note reviewed.  Constitutional:      General: He is not in acute distress.    Appearance: He is not ill-appearing.  Cardiovascular:     Rate and Rhythm: Normal rate and regular rhythm.     Pulses: Normal pulses.     Heart sounds: Normal heart sounds.  Pulmonary:     Effort: Pulmonary effort is normal.     Breath sounds: Normal breath sounds.  Abdominal:     General: Bowel sounds are normal.     Palpations: Abdomen is soft.     Tenderness: There is no abdominal tenderness. There is no guarding or rebound.     Hernia: No hernia is present.  Musculoskeletal:        General: Normal range of motion.  Neurological:     Mental Status: He is alert.      UC Treatments / Results  Labs (all labs ordered are listed, but only abnormal results are displayed) Labs Reviewed  POCT URINALYSIS DIP (MANUAL ENTRY) - Abnormal; Notable for the following components:      Result Value   Spec Grav, UA >=1.030 (*)    All other components within normal limits  CBC WITH DIFFERENTIAL/PLATELET   Narrative:    Performed at:  [address], [place], [place]  [id number] Lab Director: Rush Farmer MD, Phone:  [phone]    EKG   Radiology No results found.  Procedures Procedures (including critical care time)  Medications Ordered in UC Medications - No data to display  Initial Impression / Assessment and Plan / UC Course  I have reviewed the triage vital signs and the nursing notes.  Pertinent labs & imaging results that were available during my care of the patient were reviewed by me and considered in my medical decision making (see chart for details).     1.  Lower abdominal pain likely proctocolitis: CBC Ciprofloxacin 500 mg twice daily for 7 days Flagyl 500 mg 3 times daily for 7  days Anusol suppositories Lidocaine gel as needed for anal pain Increase fiber intake Increase oral fluid intake. If your symptoms worsen please go to the emergency department to have abdominal imaging done. Final Clinical Impressions(s) / UC Diagnoses   Final diagnoses:  Colitis  Proctocolitis with rectal bleeding  Discharge Instructions     Please use medications as prescribed If you have worsening abdominal pain, please go to the emergency room to be evaluated febrile We will call you with recommendations if your labs are abnormal Increase fiber intake Increase oral fluid intake.   ED Prescriptions    Medication Sig Dispense Auth. Provider   ciprofloxacin (CIPRO) 500 MG tablet Take 1 tablet (500 mg total) by mouth every 12 (twelve) hours. 10 tablet Trigo Winterbottom, Myrene Galas, MD   metroNIDAZOLE (FLAGYL) 500 MG tablet Take 1 tablet (500 mg total) by mouth 3 (three) times daily for 7 days. 21 tablet Cadince Hilscher, Myrene Galas, MD   lidocaine (XYLOCAINE) 5 % ointment Apply 1 application topically as needed. 35.44 g Chase Picket, MD   hydrocortisone (ANUSOL-HC) 25 MG suppository Place 1 suppository (25 mg total) rectally 2 (two) times daily. 12 suppository Jahayra Mazo, Myrene Galas, MD     PDMP not reviewed this encounter.   Chase Picket, MD 11/12/20 1450

## 2020-11-14 NOTE — Progress Notes (Signed)
11/14/2020 Jerry Steele [id number] July 08, 1966   CHIEF COMPLAINT: Rectal bleeding   HISTORY OF PRESENT ILLNESS:  Jerry Steele is a 55 year old male with a past medical history of emphysema. S/P hemorrhoidectomy 30 years ago.  He speaks [place] well, he provided his own history.  He presents to our office today as referred by Durene Fruits NP for further evaluation regarding rectal bleeding.  He passed a hard difficult bowel movement with straining which resulted in rectal bleeding for 3 consecutive days.  He describes seeing a small to moderate amount of bright red blood on the toilet tissue and in the toilet water once daily with a BM for 3 days.  He also developed central lower cramping type pain and he felt like he had to go to the bathroom every 5 minutes for several hours at a time.  He presented to the urgent care clinic on 11/11/2020 for further evaluation.  Laboratory studies were done which showed a WBC 9.7.  Hemoglobin 15.2.  He was prescribed Cipro 500 mg p.o. twice daily and Flagyl 500 mg p.o. 3 times daily for possible colitis and Anusol suppositories for presumed hemorrhoids.  He completed the prescribed course of antibiotics and Anusol suppositories and his lower abdominal pain abated.  He continues to have mild rectal discomfort without any further bleeding.  He is passing a normal formed brown bowel movement daily.  No diarrhea.  No NSAID use.  Possibly had a flexible sigmoidoscopy prior to his hemorrhoidectomy surgery 30 years ago.  He denies ever having a screening colonoscopy.  No known family history of colorectal cancer.  CBC Latest Ref Rng & Units 11/11/2020 03/14/2018 08/15/2017  WBC 3.4 - 10.8 x10E3/uL 9.7 9.2 8.6  Hemoglobin 13.0 - 17.7 g/dL 15.2 15.1 15.3  Hematocrit 37.5 - 51.0 % 44.8 45.0 44.9  Platelets 150 - 450 x10E3/uL 221 167.0 165.0   CMP Latest Ref Rng & Units 03/14/2018 03/07/2017 06/13/2015  Glucose 70 - 99 mg/dL 93 91 79  BUN 6 - 23 mg/dL 19  19 12   Creatinine 0.40 - 1.50 mg/dL 0.98 1.01 0.88  Sodium 135 - 145 mEq/L 138 139 140  Potassium 3.5 - 5.1 mEq/L 4.0 4.7 4.1  Chloride 96 - 112 mEq/L 101 100 105  CO2 19 - 32 mEq/L 33(H) 25 29  Calcium 8.4 - 10.5 mg/dL 9.3 9.5 8.6  Total Protein 6.0 - 8.5 g/dL - 7.3 7.1  Total Bilirubin 0.0 - 1.2 mg/dL - 0.3 0.6  Alkaline Phos 39 - 117 IU/L - 80 75  AST 0 - 40 IU/L - 18 15  ALT 0 - 44 IU/L - 17 14     Past Medical History:  Diagnosis Date  . Emphysema lung ([place])   . Pneumonia    Social History: Born in [place] raised in [place].  He quit smoking cigarettes 3 years ago.  No alcohol use.  No drug use.  Family History: Mother age 9 smokers. Father 21 arthritis. No known family history of esophageal, gastric or colon cancer. Maternal uncle died MI.   No Known Allergies    Outpatient Encounter Medications as of 11/15/2020  Medication Sig  . albuterol (VENTOLIN HFA) 108 (90 Base) MCG/ACT inhaler Inhale 1-2 puffs into the lungs every 6 (six) hours as needed for wheezing or shortness of breath.  . budesonide-formoterol (SYMBICORT) 160-4.5 MCG/ACT inhaler Inhale 2 puffs into the lungs 2 (two) times daily.  . ciprofloxacin (CIPRO) 500 MG tablet Take 1 tablet (500 mg  total) by mouth every 12 (twelve) hours.  . hydrocortisone (ANUSOL-HC) 25 MG suppository Place 1 suppository (25 mg total) rectally 2 (two) times daily.  Marland Kitchen lidocaine (XYLOCAINE) 5 % ointment Apply 1 application topically as needed.  . metroNIDAZOLE (FLAGYL) 500 MG tablet Take 1 tablet (500 mg total) by mouth 3 (three) times daily for 7 days.  . sildenafil (REVATIO) 20 MG tablet Take 1 tablet 1/2 hour to 1 hour prior to intercourse as needed. Limit use to 1/2 tablet or 1 tablet per 24 hours.   No facility-administered encounter medications on file as of 11/15/2020.    REVIEW OF SYSTEMS: Gen: Denies fever, sweats or chills. No weight loss.  CV: Denies chest pain, palpitations or edema. Resp: Denies cough, shortness of breath of  hemoptysis.  GI: See HPI.  Denies heartburn, dysphagia, stomach or lower abdominal pain. .  GU : Denies urinary burning, blood in urine, increased urinary frequency or incontinence. MS: Denies joint pain, muscles aches or weakness. Derm: Denies rash, itchiness, skin lesions or unhealing ulcers. Psych: Denies depression, anxiety or memory loss. Heme: Denies bruising, bleeding. Neuro:  Denies headaches, dizziness or paresthesias. Endo:  Denies any problems with DM, thyroid or adrenal function.  PHYSICAL EXAM: BP 110/66   Pulse 76   Ht 6' 1.15" (1.858 m)   Wt 185 lb (83.9 kg)   BMI 24.31 kg/m   General: 55 year old male in no acute distress. Head: Normocephalic and atraumatic. Eyes:  Sclerae non-icteric, conjunctive pink. Ears: Normal auditory acuity. Mouth: Dentition intact. No ulcers or lesions.  Neck: Supple, no lymphadenopathy or thyromegaly.  Lungs: Clear bilaterally to auscultation without wheezes, crackles or rhonchi. Heart: Regular rate and rhythm. No murmur, rub or gallop appreciated.  Abdomen: Soft, nontender, non distended. No masses. No hepatosplenomegaly. Normoactive bowel sounds x 4 quadrants.  Rectal: No external hemorrhoids or fissure.  Internal hemorrhoids palpated without prolapse.  No blood or stool in the rectal vault.  Limited rectal exam.  Melissa CMA present during exam. Musculoskeletal: Symmetrical with no gross deformities. Skin: Warm and dry. No rash or lesions on visible extremities.  Multiple tattoos. Extremities: No edema. Neurological: Alert oriented x 4, no focal deficits.  Psychological:  Alert and cooperative. Normal mood and affect.  ASSESSMENT AND PLAN:  29.  55 year old male with rectal pain and rectal bleeding. -Benefiber 1 tablespoon day -MiraLAX nightly as needed to avoid straining -Patient to call our office if rectal bleeding recurs -Colonoscopy benefits and risks discussed including risk with sedation, risk of bleeding, perforation and  infection   2.  Central lower abdominal pain in the setting of rectal bleeding as noted above.  Patient was prescribed a Cipro 5 mg p.o. twice daily and Flagyl 5 mg 1 p.o. 3 times daily for 7 days by the urgent care physician.  No further lower abdominal pain. -Patient to call our office if lower abdominal pain recurs, would recommend abdominal/pelvic CT if abdominal pain recurs -Colonoscopy as ordered above   3.  History of emphysema, stable           CC:  Camillia Herter, NP

## 2020-11-15 ENCOUNTER — Encounter: Payer: Self-pay | Admitting: Nurse Practitioner

## 2020-11-15 ENCOUNTER — Ambulatory Visit (INDEPENDENT_AMBULATORY_CARE_PROVIDER_SITE_OTHER): Payer: Self-pay | Admitting: Nurse Practitioner

## 2020-11-15 VITALS — BP 110/66 | HR 76 | Ht 73.15 in | Wt 185.0 lb

## 2020-11-15 DIAGNOSIS — K625 Hemorrhage of anus and rectum: Secondary | ICD-10-CM

## 2020-11-15 NOTE — Patient Instructions (Addendum)
If you are age 57 or younger, your body mass index should be between 19-25. Your Body mass index is 24.31 kg/m. If this is out of the aformentioned range listed, please consider follow up with your Primary Care Provider.   PROCEDURES: You have been scheduled for a colonoscopy. Please follow the written instructions given to you at your visit today. If you use inhalers (even only as needed), please bring them with you on the day of your procedure.  RECOMMENDATIONS: Benefiber- 1 tablespoon daily. Miralax- Dissolve one capful in 8 ounces of water and drink before bed.  Please call our office if your symptoms worsen.  It was great seeing you today! Thank you for entrusting me with your care and choosing [facility].  Noralyn Pick, CRNP

## 2020-11-22 NOTE — Progress Notes (Signed)
Patient did not show for appointment.   

## 2020-11-23 ENCOUNTER — Encounter: Payer: Self-pay | Admitting: Family

## 2020-11-23 ENCOUNTER — Ambulatory Visit: Payer: Self-pay | Admitting: Physician Assistant

## 2020-11-23 DIAGNOSIS — Z13220 Encounter for screening for lipoid disorders: Secondary | ICD-10-CM

## 2020-11-23 DIAGNOSIS — Z13228 Encounter for screening for other metabolic disorders: Secondary | ICD-10-CM

## 2020-11-23 DIAGNOSIS — Z1329 Encounter for screening for other suspected endocrine disorder: Secondary | ICD-10-CM

## 2020-11-23 DIAGNOSIS — Z130 Encounter for screening for diseases of the blood and blood-forming organs and certain disorders involving the immune mechanism: Secondary | ICD-10-CM

## 2020-11-23 DIAGNOSIS — Z0000 Encounter for general adult medical examination without abnormal findings: Secondary | ICD-10-CM

## 2020-11-23 DIAGNOSIS — Z131 Encounter for screening for diabetes mellitus: Secondary | ICD-10-CM

## 2020-11-29 ENCOUNTER — Other Ambulatory Visit: Payer: Self-pay

## 2020-11-29 ENCOUNTER — Ambulatory Visit (AMBULATORY_SURGERY_CENTER): Payer: Self-pay | Admitting: Internal Medicine

## 2020-11-29 ENCOUNTER — Encounter: Payer: Self-pay | Admitting: Internal Medicine

## 2020-11-29 VITALS — BP 97/65 | HR 60 | Temp 98.6°F | Resp 19 | Ht 73.5 in | Wt 185.0 lb

## 2020-11-29 DIAGNOSIS — K625 Hemorrhage of anus and rectum: Secondary | ICD-10-CM

## 2020-11-29 DIAGNOSIS — K621 Rectal polyp: Secondary | ICD-10-CM

## 2020-11-29 DIAGNOSIS — K626 Ulcer of anus and rectum: Secondary | ICD-10-CM

## 2020-11-29 MED ORDER — SODIUM CHLORIDE 0.9 % IV SOLN
500.0000 mL | Freq: Once | INTRAVENOUS | Status: DC
Start: 2020-11-29 — End: 2020-11-29

## 2020-11-29 NOTE — Patient Instructions (Addendum)
There was a tiny ulcer in the rectum. It could be from the prep but I took biopsies to see if it is from an inflammatory disease but I doubt that.  I will let you know.  All else ok - no polyps, cancer, etc.  I appreciate the opportunity to care for you. Gatha Mayer, MD, [facility]   Discharge instructions given. Biopsies taken. Resume previous medications. YOU HAD AN ENDOSCOPIC PROCEDURE TODAY AT [facility]:   Refer to the procedure report that was given to you for any specific questions about what was found during the examination.  If the procedure report does not answer your questions, please call your gastroenterologist to clarify.  If you requested that your care partner not be given the details of your procedure findings, then the procedure report has been included in a sealed envelope for you to review at your convenience later.  YOU SHOULD EXPECT: Some feelings of bloating in the abdomen. Passage of more gas than usual.  Walking can help get rid of the air that was put into your GI tract during the procedure and reduce the bloating. If you had a lower endoscopy (such as a colonoscopy or flexible sigmoidoscopy) you may notice spotting of blood in your stool or on the toilet paper. If you underwent a bowel prep for your procedure, you may not have a normal bowel movement for a few days.  Please Note:  You might notice some irritation and congestion in your nose or some drainage.  This is from the oxygen used during your procedure.  There is no need for concern and it should clear up in a day or so.  SYMPTOMS TO REPORT IMMEDIATELY:   Following lower endoscopy (colonoscopy or flexible sigmoidoscopy):  Excessive amounts of blood in the stool  Significant tenderness or worsening of abdominal pains  Swelling of the abdomen that is new, acute  Fever of 100F or higher   For urgent or emergent issues, a gastroenterologist can be reached at any hour by calling [phone]  [phone]. Do not use MyChart messaging for urgent concerns.    DIET:  We do recommend a small meal at first, but then you may proceed to your regular diet.  Drink plenty of fluids but you should avoid alcoholic beverages for 24 hours.  ACTIVITY:  You should plan to take it easy for the rest of today and you should NOT DRIVE or use heavy machinery until tomorrow (because of the sedation medicines used during the test).    FOLLOW UP: Our staff will call the number listed on your records 48-72 hours following your procedure to check on you and address any questions or concerns that you may have regarding the information given to you following your procedure. If we do not reach you, we will leave a message.  We will attempt to reach you two times.  During this call, we will ask if you have developed any symptoms of COVID 19. If you develop any symptoms (ie: fever, flu-like symptoms, shortness of breath, cough etc.) before then, please call [phone].  If you test positive for Covid 19 in the 2 weeks post procedure, please call and report this information to [place].    If any biopsies were taken you will be contacted by phone or by letter within the next 1-3 weeks.  Please call us at [phone] if you have not heard about the biopsies in 3 weeks.    SIGNATURES/CONFIDENTIALITY: You and/or your care  partner have signed paperwork which will be entered into your electronic medical record.  These signatures attest to the fact that that the information above on your After Visit Summary has been reviewed and is understood.  Full responsibility of the confidentiality of this discharge information lies with you and/or your care-partner.

## 2020-11-29 NOTE — Progress Notes (Signed)
Called to room to assist during endoscopic procedure.  Patient ID and intended procedure confirmed with present staff. Received instructions for my participation in the procedure from the performing physician.  

## 2020-11-29 NOTE — Op Note (Signed)
[facility] Patient Name: Kristen Bushway Procedure Date: 11/29/2020 8:34 AM MRN: [id number] Endoscopist: Gatha Mayer , MD Age: 55 Referring MD:  Date of Birth: Mar 07, 1966 Gender: Male Account #: [id number] Procedure:                Colonoscopy Indications:              Rectal bleeding Medicines:                Propofol per Anesthesia, Monitored Anesthesia Care Procedure:                Pre-Anesthesia Assessment:                           - Prior to the procedure, a History and Physical                            was performed, and patient medications and                            allergies were reviewed. The patient's tolerance of                            previous anesthesia was also reviewed. The risks                            and benefits of the procedure and the sedation                            options and risks were discussed with the patient.                            All questions were answered, and informed consent                            was obtained. Prior Anticoagulants: The patient has                            taken no previous anticoagulant or antiplatelet                            agents. ASA Grade Assessment: II - A patient with                            mild systemic disease. After reviewing the risks                            and benefits, the patient was deemed in                            satisfactory condition to undergo the procedure.                           After obtaining informed consent, the colonoscope  was passed under direct vision. Throughout the                            procedure, the patient's blood pressure, pulse, and                            oxygen saturations were monitored continuously. The                            Olympus CF-HQ190L ([id number]) Colonoscope was                            introduced through the anus and advanced to the the                            cecum,  identified by appendiceal orifice and                            ileocecal valve. The colonoscopy was performed                            without difficulty. The patient tolerated the                            procedure well. The quality of the bowel                            preparation was good. The ileocecal valve,                            appendiceal orifice, and rectum were photographed.                            The bowel preparation used was Miralax via split                            dose instruction. Scope In: 8:42:06 AM Scope Out: 8:54:33 AM Scope Withdrawal Time: 0 hours 9 minutes 48 seconds  Total Procedure Duration: 0 hours 12 minutes 27 seconds  Findings:                 The perianal and digital rectal examinations were                            normal. Pertinent negatives include normal prostate                            (size, shape, and consistency).                           A single (solitary) three mm ulcer was found in the                            rectum. Biopsies were taken with a cold forceps for  histology. Verification of patient identification                            for the specimen was done. Estimated blood loss was                            minimal.                           The exam was otherwise without abnormality on                            direct and retroflexion views. Complications:            No immediate complications. Estimated Blood Loss:     Estimated blood loss was minimal. Impression:               - A single (solitary) ulcer in the rectum.                            Biopsied. Seems unlikely to be IBD but he did have                            an episode of abdominal pain, diarrghea and rectal                            bleeding.                           - The examination was otherwise normal on direct                            and retroflexion views. Recommendation:           - Patient has a contact  number available for                            emergencies. The signs and symptoms of potential                            delayed complications were discussed with the                            patient. Return to normal activities tomorrow.                            Written discharge instructions were provided to the                            patient.                           - Resume previous diet.                           - Continue present medications.                           -  Await pathology results.                           - Repeat colonoscopy is recommended. The                            colonoscopy date will be determined after pathology                            results from today's exam become available for                            review. Gatha Mayer, MD 11/29/2020 9:03:56 AM This report has been signed electronically.

## 2020-11-29 NOTE — Progress Notes (Signed)
Check-in-AER  Vital Signs-CW

## 2020-11-29 NOTE — Progress Notes (Signed)
Report to PACU, RN, vss, BBS= Clear.  

## 2020-12-01 ENCOUNTER — Telehealth: Payer: Self-pay | Admitting: *Deleted

## 2020-12-01 NOTE — Telephone Encounter (Signed)
1. Have you developed a fever since your procedure? no  2.   Have you had an respiratory symptoms (SOB or cough) since your procedure? no 3.   Have you tested positive for COVID 19 since your procedure no 4.   Have you had any family members/close contacts diagnosed with the COVID 19 since your procedure?  no   If yes to any of these questions please route to Joylene John, RN and Joella Prince, RN Follow up Call-  Call back number 11/29/2020  Post procedure Call Back phone  # [phone]  Permission to leave phone message Yes  Some recent data might be hidden     Patient questions:  Do you have a fever, pain , or abdominal swelling? No. Pain Score  0 *  Have you tolerated food without any problems? Yes.    Have you been able to return to your normal activities? Yes.    Do you have any questions about your discharge instructions: Diet   No. Medications  No. Follow up visit  No.  Do you have questions or concerns about your Care? No.  Actions: * If pain score is 4 or above: No action needed, pain <4.

## 2020-12-03 ENCOUNTER — Telehealth: Payer: Self-pay | Admitting: *Deleted

## 2020-12-03 NOTE — Telephone Encounter (Signed)
Returned call to pt to advise pt that no refills needed on medications until Dr Carlean Purl sees pathology results the patient understood

## 2020-12-11 ENCOUNTER — Encounter: Payer: Self-pay | Admitting: Internal Medicine

## 2020-12-14 NOTE — Progress Notes (Signed)
Patient did not show for appointment.   

## 2020-12-15 ENCOUNTER — Encounter: Payer: Self-pay | Admitting: Family

## 2020-12-15 DIAGNOSIS — Z131 Encounter for screening for diabetes mellitus: Secondary | ICD-10-CM

## 2020-12-15 DIAGNOSIS — Z0000 Encounter for general adult medical examination without abnormal findings: Secondary | ICD-10-CM

## 2020-12-15 DIAGNOSIS — Z13220 Encounter for screening for lipoid disorders: Secondary | ICD-10-CM

## 2020-12-15 DIAGNOSIS — Z130 Encounter for screening for diseases of the blood and blood-forming organs and certain disorders involving the immune mechanism: Secondary | ICD-10-CM

## 2020-12-15 DIAGNOSIS — Z13228 Encounter for screening for other metabolic disorders: Secondary | ICD-10-CM

## 2020-12-15 DIAGNOSIS — Z1329 Encounter for screening for other suspected endocrine disorder: Secondary | ICD-10-CM

## 2021-01-02 NOTE — Progress Notes (Deleted)
Patient ID: Jerry Steele, male    DOB: 09-02-1965  MRN: [id number]  CC: Annual Physical Exam  Subjective: Jerry Steele is a 55 y.o. male who presents for annual physical exam.   His concerns today include: ***  Patient Active Problem List   Diagnosis Date Noted   Rectal bleeding 11/15/2020   Acute bronchitis 08/30/2018   Spell of dizziness 03/14/2018   Viral upper respiratory illness 03/14/2018   Cough variant asthma 11/27/2017   Hypersensitivity pneumonitis ([place]) 09/06/2017   Cough 06/27/2017   Rib pain 06/27/2017   Tobacco use disorder 05/16/2017     Current Outpatient Medications on File Prior to Visit  Medication Sig Dispense Refill   albuterol (VENTOLIN HFA) 108 (90 Base) MCG/ACT inhaler Inhale 1-2 puffs into the lungs every 6 (six) hours as needed for wheezing or shortness of breath. 18 g 0   budesonide-formoterol (SYMBICORT) 160-4.5 MCG/ACT inhaler Inhale 2 puffs into the lungs 2 (two) times daily. 1 each 5   hydrocortisone (ANUSOL-HC) 25 MG suppository Place 1 suppository (25 mg total) rectally 2 (two) times daily. 12 suppository 0   lidocaine (XYLOCAINE) 5 % ointment Apply 1 application topically as needed. 35.44 g 0   sildenafil (REVATIO) 20 MG tablet Take 1 tablet 1/2 hour to 1 hour prior to intercourse as needed. Limit use to 1/2 tablet or 1 tablet per 24 hours. 30 tablet 2   No current facility-administered medications on file prior to visit.    No Known Allergies  Social History   Socioeconomic History   Marital status: Single    Spouse name: Not on file   Number of children: Not on file   Years of education: Not on file   Highest education level: Not on file  Occupational History   Not on file  Tobacco Use   Smoking status: Former    Packs/day: 0.25    Years: 30.00    Pack years: 7.50    Types: Cigarettes    Quit date: 12/16/2016    Years since quitting: 4.0   Smokeless tobacco: Never  Vaping Use   Vaping Use: Never used   Substance and Sexual Activity   Alcohol use: No    Alcohol/week: 0.0 standard drinks   Drug use: No   Sexual activity: Yes  Other Topics Concern   Not on file  Social History Narrative   Not on file   Social Determinants of Health   Financial Resource Strain: Not on file  Food Insecurity: Not on file  Transportation Needs: Not on file  Physical Activity: Not on file  Stress: Not on file  Social Connections: Not on file  Intimate Partner Violence: Not on file    Family History  Problem Relation Age of Onset   Colon cancer Neg Hx    Colon polyps Neg Hx    Esophageal cancer Neg Hx    Rectal cancer Neg Hx    Stomach cancer Neg Hx     Past Surgical History:  Procedure Laterality Date   HEMORRHOID SURGERY     25 years ago    ROS: Review of Systems Negative except as stated above  PHYSICAL EXAM: There were no vitals taken for this visit.  Physical Exam  {male adult master:310786} {male adult master:310785}  CMP Latest Ref Rng & Units 03/14/2018 03/07/2017 06/13/2015  Glucose 70 - 99 mg/dL 93 91 79  BUN 6 - 23 mg/dL 19 19 12   Creatinine 0.40 - 1.50 mg/dL 0.98 1.01 0.88  Sodium 135 - 145 mEq/L 138 139 140  Potassium 3.5 - 5.1 mEq/L 4.0 4.7 4.1  Chloride 96 - 112 mEq/L 101 100 105  CO2 19 - 32 mEq/L 33(H) 25 29  Calcium 8.4 - 10.5 mg/dL 9.3 9.5 8.6  Total Protein 6.0 - 8.5 g/dL - 7.3 7.1  Total Bilirubin 0.0 - 1.2 mg/dL - 0.3 0.6  Alkaline Phos 39 - 117 IU/L - 80 75  AST 0 - 40 IU/L - 18 15  ALT 0 - 44 IU/L - 17 14   Lipid Panel     Component Value Date/Time   CHOL 206 (H) 03/31/2016 1417   TRIG 210 (H) 03/31/2016 1417   HDL 40 03/31/2016 1417   CHOLHDL 5.2 (H) 03/31/2016 1417   VLDL 42 (H) 03/31/2016 1417   LDLCALC 124 03/31/2016 1417    CBC    Component Value Date/Time   WBC 9.7 11/11/2020 1842   WBC 9.2 03/14/2018 1112   RBC 4.98 11/11/2020 1842   RBC 4.99 03/14/2018 1112   HGB 15.2 11/11/2020 1842   HCT 44.8 11/11/2020 1842   PLT 221  11/11/2020 1842   MCV 90 11/11/2020 1842   MCH 30.5 11/11/2020 1842   MCH 31.6 (A) 10/08/2015 1050   MCHC 33.9 11/11/2020 1842   MCHC 33.6 03/14/2018 1112   RDW 12.8 11/11/2020 1842   LYMPHSABS 1.9 11/11/2020 1842   MONOABS 0.6 03/14/2018 1112   EOSABS 0.3 11/11/2020 1842   BASOSABS 0.1 11/11/2020 1842    ASSESSMENT AND PLAN:  There are no diagnoses linked to this encounter.   Patient was given the opportunity to ask questions.  Patient verbalized understanding of the plan and was able to repeat key elements of the plan. Patient was given clear instructions to go to Emergency Department or return to medical center if symptoms don't improve, worsen, or new problems develop.The patient verbalized understanding.   No orders of the defined types were placed in this encounter.    Requested Prescriptions    No prescriptions requested or ordered in this encounter    No follow-ups on file.  Camillia Herter, NP

## 2021-01-03 ENCOUNTER — Encounter: Payer: Self-pay | Admitting: Family

## 2021-01-03 NOTE — Progress Notes (Signed)
Patient ID: Jerry Steele, male    DOB: 01/18/1966  MRN: [id number]  CC: Annual Physical Exam   Subjective: Jerry Steele is a 55 y.o. male who presents for annual physical exam.   His concerns today include:   LEFT HAND PAIN: Located laterally. Comes and goes. Taking Ibuprofen. He is a Hydrographic surveyor.   2. THROAT CONCERN: Reports he can taste a metallic taste every morning with awakening and then resolves. Denies shortness of breath, chest pain, or difficulty breathing. Reports doing his own research and saw where doctors can but a device down the throat and take out what he describes as white balls that look like cheese. Reports he believes he has this. Would like referral to see if he needs a surgery.  3. ERECTILE DYSFUNCTION FOLLOW-UP: Reports taking 3 pills once daily about 3 times per week. Reports he has been doing this for at least 5 years. Requesting refills of Sildenafil.  Patient Active Problem List   Diagnosis Date Noted   Rectal bleeding 11/15/2020   Acute bronchitis 08/30/2018   Spell of dizziness 03/14/2018   Viral upper respiratory illness 03/14/2018   Cough variant asthma 11/27/2017   Hypersensitivity pneumonitis ([place]) 09/06/2017   Cough 06/27/2017   Rib pain 06/27/2017     Current Outpatient Medications on File Prior to Visit  Medication Sig Dispense Refill   albuterol (VENTOLIN HFA) 108 (90 Base) MCG/ACT inhaler Inhale 1-2 puffs into the lungs every 6 (six) hours as needed for wheezing or shortness of breath. 18 g 0   budesonide-formoterol (SYMBICORT) 160-4.5 MCG/ACT inhaler Inhale 2 puffs into the lungs 2 (two) times daily. 1 each 5   hydrocortisone (ANUSOL-HC) 25 MG suppository Place 1 suppository (25 mg total) rectally 2 (two) times daily. 12 suppository 0   lidocaine (XYLOCAINE) 5 % ointment Apply 1 application topically as needed. 35.44 g 0   No current facility-administered medications on file prior to visit.    No Known  Allergies  Social History   Socioeconomic History   Marital status: Single    Spouse name: Not on file   Number of children: Not on file   Years of education: Not on file   Highest education level: Not on file  Occupational History   Not on file  Tobacco Use   Smoking status: Former    Packs/day: 0.25    Years: 30.00    Pack years: 7.50    Types: Cigarettes    Quit date: 12/16/2016    Years since quitting: 4.0   Smokeless tobacco: Never  Vaping Use   Vaping Use: Never used  Substance and Sexual Activity   Alcohol use: No    Alcohol/week: 0.0 standard drinks   Drug use: No   Sexual activity: Yes  Other Topics Concern   Not on file  Social History Narrative   Not on file   Social Determinants of Health   Financial Resource Strain: Not on file  Food Insecurity: Not on file  Transportation Needs: Not on file  Physical Activity: Not on file  Stress: Not on file  Social Connections: Not on file  Intimate Partner Violence: Not on file    Family History  Problem Relation Age of Onset   Colon cancer Neg Hx    Colon polyps Neg Hx    Esophageal cancer Neg Hx    Rectal cancer Neg Hx    Stomach cancer Neg Hx     Past Surgical History:  Procedure Laterality Date  HEMORRHOID SURGERY     25 years ago    ROS: Review of Systems Negative except as stated above  PHYSICAL EXAM: BP 119/75 (BP Location: Left Arm, Patient Position: Sitting, Cuff Size: Normal)   Pulse 79   Temp 98.1 F (36.7 C)   Resp 15   Ht 6' 1.15" (1.858 m)   Wt 187 lb 6.4 oz (85 kg)   SpO2 96%   BMI 24.62 kg/m   Physical Exam General appearance - alert, well appearing, and in no distress and oriented to person, place, and time Mental status - alert, oriented to person, place, and time, normal mood, behavior, speech, dress, motor activity, and thought processes Eyes - pupils equal and reactive, extraocular eye movements intact Ears - bilateral TM's and external ear canals normal Neck -  supple, no significant adenopathy Lymphatics - no palpable lymphadenopathy, no hepatosplenomegaly Chest - clear to auscultation, no wheezes, rales or rhonchi, symmetric air entry, no tachypnea, retractions or cyanosis Heart - normal rate, regular rhythm, normal S1, S2, no murmurs, rubs, clicks or gallops Abdomen - soft, nontender, nondistended, no masses or organomegaly GU Male - patient declined exam Neurological - alert, oriented, normal speech, no focal findings or movement disorder noted, cranial nerves II through XII intact, funduscopic exam normal, discs flat and sharp, DTR's normal and symmetric, motor and sensory grossly normal bilaterally, normal muscle tone, no tremors, strength 5/5 Musculoskeletal - no joint tenderness, deformity or swelling Extremities - peripheral pulses normal, no pedal edema, no clubbing or cyanosis Skin - normal coloration and turgor, no rashes, no suspicious skin lesions noted   ASSESSMENT AND PLAN: 1. Annual physical exam: - Counseled on 150 minutes of exercise per week as tolerated, healthy eating (including decreased daily intake of saturated fats, cholesterol, added sugars, sodium), STI prevention, and routine healthcare maintenance.  2. Screening for metabolic disorder: - CMP to check kidney function, liver function, and electrolyte balance.  - CMP14+EGFR  3. Screening for deficiency anemia: - CBC to screen for anemia. - CBC  4. Diabetes mellitus screening: - Hemoglobin A1c to screen for pre-diabetes/diabetes. - Hemoglobin A1c  5. Screening cholesterol level: - Lipid panel to screen for high cholesterol.  - Lipid panel  6. Thyroid disorder screen: - TSH to check thyroid function.  - TSH  7. Need for Tdap vaccination: - Administered today in office. - Tdap vaccine greater than or equal to 7yo IM  8. Need for shingles vaccine: - Administered today in office.  - Varicella-zoster vaccine IM  9. Neck discomfort: - Patient reports he can  taste a metallic taste every morning with awakening and then resolves. Denies shortness of breath, chest pain, or difficulty breathing. Reports doing his own research and saw where doctors can but a device down the throat and take out what he describes as white balls that look like cheese. Reports he believes he has this. Would like referral to see if he needs a surgery. - Per patient request referral to ENT for further evaluation and management. - Ambulatory referral to ENT  10. Carpal tunnel syndrome of left wrist: - Begin Gabapentin as prescribed. May cause drowsiness. Counseled patient to not consume if operating heavy machinery or driving. Counseled patient to not consume with alcohol or illicit substances. Patient verbalized understanding.  - Follow-up with primary provider in 4 weeks or sooner if needed.  - gabapentin (NEURONTIN) 100 MG capsule; Take 1 capsule (100 mg total) by mouth at bedtime.  Dispense: 30 capsule; Refill: 1  11.  Erectile dysfunction, unspecified erectile dysfunction type: - Continue Sildenafil as prescribed. - Follow-up with primary provider as scheduled.  - sildenafil (REVATIO) 20 MG tablet; Take 1 tablet 1/2 hour to 1 hour prior to intercourse as needed. Limit use to 1/2 tablet or 1 tablet per 24 hours.  Dispense: 30 tablet; Refill: 2    Patient was given the opportunity to ask questions.  Patient verbalized understanding of the plan and was able to repeat key elements of the plan. Patient was given clear instructions to go to Emergency Department or return to medical center if symptoms don't improve, worsen, or new problems develop.The patient verbalized understanding.   Orders Placed This Encounter  Procedures   Tdap vaccine greater than or equal to 7yo IM   Varicella-zoster vaccine IM   CBC   TSH   CMP14+EGFR   Hemoglobin A1c   Lipid panel   Ambulatory referral to ENT     Requested Prescriptions   Signed Prescriptions Disp Refills   gabapentin  (NEURONTIN) 100 MG capsule 30 capsule 1    Sig: Take 1 capsule (100 mg total) by mouth at bedtime.   sildenafil (REVATIO) 20 MG tablet 30 tablet 2    Sig: Take 1 tablet 1/2 hour to 1 hour prior to intercourse as needed. Limit use to 1/2 tablet or 1 tablet per 24 hours.    Follow-up with primary provider as scheduled.   Camillia Herter, NP

## 2021-01-03 NOTE — Progress Notes (Signed)
Patient did not show for appointment.   

## 2021-01-04 ENCOUNTER — Ambulatory Visit (INDEPENDENT_AMBULATORY_CARE_PROVIDER_SITE_OTHER): Payer: Self-pay | Admitting: Family

## 2021-01-04 ENCOUNTER — Other Ambulatory Visit: Payer: Self-pay

## 2021-01-04 ENCOUNTER — Encounter: Payer: Self-pay | Admitting: Family

## 2021-01-04 VITALS — BP 119/75 | HR 79 | Temp 98.1°F | Resp 15 | Ht 73.15 in | Wt 187.4 lb

## 2021-01-04 DIAGNOSIS — G5602 Carpal tunnel syndrome, left upper limb: Secondary | ICD-10-CM

## 2021-01-04 DIAGNOSIS — Z131 Encounter for screening for diabetes mellitus: Secondary | ICD-10-CM

## 2021-01-04 DIAGNOSIS — Z13220 Encounter for screening for lipoid disorders: Secondary | ICD-10-CM

## 2021-01-04 DIAGNOSIS — Z13228 Encounter for screening for other metabolic disorders: Secondary | ICD-10-CM

## 2021-01-04 DIAGNOSIS — Z0000 Encounter for general adult medical examination without abnormal findings: Secondary | ICD-10-CM

## 2021-01-04 DIAGNOSIS — Z130 Encounter for screening for diseases of the blood and blood-forming organs and certain disorders involving the immune mechanism: Secondary | ICD-10-CM

## 2021-01-04 DIAGNOSIS — Z1329 Encounter for screening for other suspected endocrine disorder: Secondary | ICD-10-CM

## 2021-01-04 DIAGNOSIS — M542 Cervicalgia: Secondary | ICD-10-CM

## 2021-01-04 DIAGNOSIS — Z23 Encounter for immunization: Secondary | ICD-10-CM

## 2021-01-04 DIAGNOSIS — N529 Male erectile dysfunction, unspecified: Secondary | ICD-10-CM

## 2021-01-04 MED ORDER — GABAPENTIN 100 MG PO CAPS: 100.0000 mg | ORAL_CAPSULE | Freq: Every day | ORAL | 1 refills | Status: DC

## 2021-01-04 MED ORDER — SILDENAFIL CITRATE 20 MG PO TABS: ORAL_TABLET | ORAL | 2 refills | Status: DC

## 2021-01-04 NOTE — Progress Notes (Signed)
Pt presents for annual physical exam Pt experiencing pain in left hand Desires Shingles and Tdap vaccine Request refill on sildenafil

## 2021-01-05 ENCOUNTER — Ambulatory Visit: Payer: Self-pay | Admitting: *Deleted

## 2021-01-05 DIAGNOSIS — Z7185 Encounter for immunization safety counseling: Secondary | ICD-10-CM

## 2021-01-05 LAB — CMP14+EGFR
ALT: 15 IU/L (ref 0–44)
AST: 15 IU/L (ref 0–40)
Albumin/Globulin Ratio: 1.5 (ref 1.2–2.2)
Albumin: 4.4 g/dL (ref 3.8–4.9)
Alkaline Phosphatase: 84 IU/L (ref 44–121)
BUN/Creatinine Ratio: 20 (ref 9–20)
BUN: 18 mg/dL (ref 6–24)
Bilirubin Total: 0.2 mg/dL (ref 0.0–1.2)
CO2: 21 mmol/L (ref 20–29)
Calcium: 8.8 mg/dL (ref 8.7–10.2)
Chloride: 101 mmol/L (ref 96–106)
Creatinine, Ser: 0.88 mg/dL (ref 0.76–1.27)
Globulin, Total: 2.9 g/dL (ref 1.5–4.5)
Glucose: 75 mg/dL (ref 65–99)
Potassium: 4 mmol/L (ref 3.5–5.2)
Sodium: 141 mmol/L (ref 134–144)
Total Protein: 7.3 g/dL (ref 6.0–8.5)
eGFR: 101 mL/min/{1.73_m2} (ref >59)

## 2021-01-05 LAB — CBC
Hematocrit: 45.7 % (ref 37.5–51.0)
Hemoglobin: 15.5 g/dL (ref 13.0–17.7)
MCH: 30.3 pg (ref 26.6–33.0)
MCHC: 33.9 g/dL (ref 31.5–35.7)
MCV: 89 fL (ref 79–97)
Platelets: 185 10*3/uL (ref 150–450)
RBC: 5.11 x10E6/uL (ref 4.14–5.80)
RDW: 12.7 % (ref 11.6–15.4)
WBC: 6.9 10*3/uL (ref 3.4–10.8)

## 2021-01-05 LAB — LIPID PANEL
Chol/HDL Ratio: 4.9 ratio (ref 0.0–5.0)
Cholesterol, Total: 192 mg/dL (ref 100–199)
HDL: 39 mg/dL — ABNORMAL LOW (ref >39)
LDL Chol Calc (NIH): 115 mg/dL — ABNORMAL HIGH (ref 0–99)
Triglycerides: 219 mg/dL — ABNORMAL HIGH (ref 0–149)
VLDL Cholesterol Cal: 38 mg/dL (ref 5–40)

## 2021-01-05 LAB — HEMOGLOBIN A1C
Est. average glucose Bld gHb Est-mCnc: 114 mg/dL
Hgb A1c MFr Bld: 5.6 % (ref 4.8–5.6)

## 2021-01-05 LAB — TSH: TSH: 4.21 u[IU]/mL (ref 0.450–4.500)

## 2021-01-05 NOTE — Progress Notes (Signed)
Kidney function normal.   Liver function normal.   Thyroid function normal.   No diabetes.   No anemia.   Cholesterol higher than expected. High cholesterol may increase risk of heart attack and/or stroke. Consider eating more fruits, vegetables, and lean baked meats such as chicken or fish. Moderate intensity exercise at least 150 minutes as tolerated per week may help as well. No medication needed at the moment. Patient encouraged to recheck in 3 to 6 months.   The following is for provider reference only: The 10-year ASCVD risk score is: 6.4%   Values used to calculate the score:     Age: 55 years     Sex: Male     Is Non-Hispanic African American: No     Diabetic: No     Tobacco smoker: No     Systolic Blood Pressure: 886 mmHg     Is BP treated: No     HDL Cholesterol: 39 mg/dL     Total Cholesterol: 192 mg/dL

## 2021-01-05 NOTE — Progress Notes (Signed)
MA spoke with patient regarding vaccine care post receiving. Patient has no fever currently. Patient advised to drink plenty of water and rest while taking tylenol to address body aches and slight fever. Patient aware of monitor localized redness, induration, ongoing fever in the area and to return to [facility] if any of those concerns arise.

## 2021-04-08 ENCOUNTER — Other Ambulatory Visit: Payer: Self-pay

## 2021-04-08 ENCOUNTER — Telehealth: Payer: Self-pay | Admitting: Family

## 2021-04-08 ENCOUNTER — Ambulatory Visit (INDEPENDENT_AMBULATORY_CARE_PROVIDER_SITE_OTHER): Payer: Self-pay | Admitting: Nurse Practitioner

## 2021-04-08 DIAGNOSIS — N529 Male erectile dysfunction, unspecified: Secondary | ICD-10-CM

## 2021-04-08 MED ORDER — ALBUTEROL SULFATE HFA 108 (90 BASE) MCG/ACT IN AERS: 1.0000 | INHALATION_SPRAY | Freq: Four times a day (QID) | RESPIRATORY_TRACT | 0 refills | Status: DC | PRN

## 2021-04-08 MED ORDER — BUDESONIDE-FORMOTEROL FUMARATE 160-4.5 MCG/ACT IN AERO: 2.0000 | INHALATION_SPRAY | Freq: Two times a day (BID) | RESPIRATORY_TRACT | 5 refills | Status: DC

## 2021-04-08 MED ORDER — SILDENAFIL CITRATE 20 MG PO TABS: ORAL_TABLET | ORAL | 2 refills | Status: DC

## 2021-04-08 NOTE — Progress Notes (Signed)
Virtual Visit via Telephone Note  I connected with [name] on 04/11/21 at 10:30 AM EDT by telephone and verified that I am speaking with the correct person using two identifiers.  Location: Patient: home Provider: office   I discussed the limitations, risks, security and privacy concerns of performing an evaluation and management service by telephone and the availability of in person appointments. I also discussed with the patient that there may be a patient responsible charge related to this service. The patient expressed understanding and agreed to proceed.   History of Present Illness:  Patient presents today for medication refill.  This is a televisit.  Spanish interpreter is used for this visit.  Patient states that he needs refills on COPD medications including Symbicort and albuterol.  Patient does follow with pulmonary and states that his COPD has been stable.  Patient would also like sildenafil for erectile dysfunction refill. Denies f/c/s, n/v/d, hemoptysis, PND, chest pain or edema.     Observations/Objective:  Vitals with BMI 01/04/2021 11/29/2020 11/29/2020  Height 6' 1.15" - -  Weight 187 lbs 6 oz - -  BMI 92.33 - -  Systolic 007 97 97  Diastolic 75 65 60  Pulse 79 60 62      Assessment and Plan:  COPD exacerbation ([place]): - Stable.  - Continue Albuterol and Symbicort as prescribed.  - Follow-up with Pulmonology as scheduled.  Erectile Dysfunction:  Will refill sildenafil  Follow up:  Follow up with PCP in 3 months or sooner if needed    I discussed the assessment and treatment plan with the patient. The patient was provided an opportunity to ask questions and all were answered. The patient agreed with the plan and demonstrated an understanding of the instructions.   The patient was advised to call back or seek an in-person evaluation if the symptoms worsen or if the condition fails to improve as anticipated.  I provided 23 minutes of  non-face-to-face time during this encounter.   Fenton Foy, NP

## 2021-04-11 ENCOUNTER — Encounter: Payer: Self-pay | Admitting: Nurse Practitioner

## 2021-04-11 NOTE — Patient Instructions (Addendum)
COPD exacerbation ([place]): - Stable.  - Continue Albuterol and Symbicort as prescribed.  - Follow-up with Pulmonology as scheduled.  Erectile Dysfunction:  Will refill sildenafil  Follow up:  Follow up with PCP in 3 months or sooner if needed

## 2021-04-28 NOTE — Progress Notes (Signed)
Patient ID: Cambridge Deleo, male    DOB: 04-05-1966  MRN: [id number]  CC: COPD Follow-Up  Subjective: Jerry Steele is a 55 y.o. male who presents for COPD follow-up.   His concerns today include:   COPD FOLLOW-UP: [facility] 04/08/2021 with Lazaro Arms, NP: - Stable.  - Continue Albuterol and Symbicort as prescribed.  - Follow-up with Pulmonology as scheduled.  [facility] 04/21/2021 per NP note: Impression/Plan 1. Acute bacterial sinusitis  - POCT COVID BD - amoxicillin-clavulanate (AUGMENTIN) 875-125 mg per tablet; Take 1 tablet by mouth 2 times daily for 10 days. Dispense: 20 tablet; Refill: 0 - predniSONE (DELTASONE) 20 MG tablet; Take 2 tablets (40 mg total) by mouth daily for 7 days. Take 2 tablets (40 mg total) in the mornings together with breakfast daily for 5 days Dispense: 14 tablet; Refill: 0  2. Acute bronchitis due to infection  - codeine-guaifenesin (CHERATUSSIN AC) 10-100 mg/5 mL syrup; Take 5 mLs by mouth nightly as needed for up to 14 days for Cough. Dispense: 70 mL; Refill: 0 - albuterol 90 mcg/actuation inhaler; Inhale 2 puffs into the lungs every 6 (six) hours as needed for up to 10 days for Wheezing. Dispense: 1 each; Refill: 0  3. Wheezing  - predniSONE (DELTASONE) 20 MG tablet; Take 2 tablets (40 mg total) by mouth daily for 7 days. Take 2 tablets (40 mg total) in the mornings together with breakfast daily for 5 days Dispense: 14 tablet; Refill: 0 - albuterol 90 mcg/actuation inhaler; Inhale 2 puffs into the lungs every 6 (six) hours as needed for up to 10 days for Wheezing. Dispense: 1 each; Refill: 0  4. Cough, unspecified type  - POCT COVID BD - predniSONE (DELTASONE) 20 MG tablet; Take 2 tablets (40 mg total) by mouth daily for 7 days. Take 2 tablets (40 mg total) in the mornings together with breakfast daily for 5 days Dispense: 14 tablet; Refill: 0 -  codeine-guaifenesin (CHERATUSSIN AC) 10-100 mg/5 mL syrup; Take 5 mLs by mouth nightly as needed for up to 14 days for Cough. Dispense: 70 mL; Refill: 0 - albuterol 90 mcg/actuation inhaler; Inhale 2 puffs into the lungs every 6 (six) hours as needed for up to 10 days for Wheezing. Dispense: 1 each; Refill: 0   05/04/2021: Reports COPD is related to history of work exposure. Reports does not smoke currently or in the past. Reports only received antibiotics and codeine cough syrup at most recent Urgent Care discharge. Doesn't prefer codeine cough syrup because makes him drowsy. Reports the pharmacy canceled the prescription for steroids without explanation. Still taking Symbicort as prescribed. Reports does not take Albuterol inhaler very often because was told this is to be used for emergencies only. Reports has not used Albuterol recently because unsure if he needed to do so. Endorses frequent productive cough of phlegm without blood. Denies shortness of breath and chest pain. Reports last appointment with Pulmonology was at least 1 year ago.   Patient Active Problem List   Diagnosis Date Noted   Rectal bleeding 11/15/2020   Acute bronchitis 08/30/2018   Spell of dizziness 03/14/2018   Viral upper respiratory illness 03/14/2018   Cough variant asthma 11/27/2017   Hypersensitivity pneumonitis ([place]) 09/06/2017   Cough 06/27/2017   Rib pain 06/27/2017     Current Outpatient Medications on File Prior to Visit  Medication Sig Dispense Refill   albuterol (VENTOLIN HFA) 108 (90 Base) MCG/ACT inhaler Inhale  1-2 puffs into the lungs every 6 (six) hours as needed for wheezing or shortness of breath. 18 g 0   budesonide-formoterol (SYMBICORT) 160-4.5 MCG/ACT inhaler Inhale 2 puffs into the lungs 2 (two) times daily. 1 each 5   gabapentin (NEURONTIN) 100 MG capsule Take 1 capsule (100 mg total) by mouth at bedtime. 30 capsule 1   hydrocortisone (ANUSOL-HC) 25 MG suppository Place 1 suppository (25 mg  total) rectally 2 (two) times daily. 12 suppository 0   lidocaine (XYLOCAINE) 5 % ointment Apply 1 application topically as needed. 35.44 g 0   sildenafil (REVATIO) 20 MG tablet Take 1 tablet 1/2 hour to 1 hour prior to intercourse as needed. Limit use to 1/2 tablet or 1 tablet per 24 hours. 30 tablet 2   No current facility-administered medications on file prior to visit.    No Known Allergies  Social History   Socioeconomic History   Marital status: Single    Spouse name: Not on file   Number of children: Not on file   Years of education: Not on file   Highest education level: Not on file  Occupational History   Not on file  Tobacco Use   Smoking status: Former    Packs/day: 0.25    Years: 30.00    Pack years: 7.50    Types: Cigarettes    Quit date: 12/16/2016    Years since quitting: 4.3   Smokeless tobacco: Never  Vaping Use   Vaping Use: Never used  Substance and Sexual Activity   Alcohol use: No    Alcohol/week: 0.0 standard drinks   Drug use: No   Sexual activity: Yes  Other Topics Concern   Not on file  Social History Narrative   Not on file   Social Determinants of Health   Financial Resource Strain: Not on file  Food Insecurity: Not on file  Transportation Needs: Not on file  Physical Activity: Not on file  Stress: Not on file  Social Connections: Not on file  Intimate Partner Violence: Not on file    Family History  Problem Relation Age of Onset   Colon cancer Neg Hx    Colon polyps Neg Hx    Esophageal cancer Neg Hx    Rectal cancer Neg Hx    Stomach cancer Neg Hx     Past Surgical History:  Procedure Laterality Date   HEMORRHOID SURGERY     25 years ago    ROS: Review of Systems Negative except as stated above  PHYSICAL EXAM: BP 129/89 (BP Location: Right Arm, Patient Position: Sitting, Cuff Size: Normal)   Pulse 65   Resp 20   Ht 6' 1.5" (1.867 m)   Wt 189 lb (85.7 kg)   SpO2 96%   BMI 24.60 kg/m   Physical Exam HENT:      Head: Normocephalic and atraumatic.  Eyes:     Extraocular Movements: Extraocular movements intact.     Conjunctiva/sclera: Conjunctivae normal.     Pupils: Pupils are equal, round, and reactive to light.  Cardiovascular:     Rate and Rhythm: Normal rate and regular rhythm.     Pulses: Normal pulses.     Heart sounds: Normal heart sounds.  Pulmonary:     Effort: Pulmonary effort is normal.     Comments: Mild wheezing bilateral upper lung fields.  Musculoskeletal:     Cervical back: Normal range of motion and neck supple.  Neurological:     General: No focal deficit present.  Mental Status: He is alert and oriented to person, place, and time.  Psychiatric:        Mood and Affect: Mood normal.        Behavior: Behavior normal.   ASSESSMENT AND PLAN: 1. Chronic obstructive pulmonary disease, unspecified COPD type [facility]): - Patient today in office without cardiopulmonary distress.  - Begin Prednisone as prescribed.  - Continue Budesonide-Formoterol inhaler as prescribed.  - Counseled to use Albuterol inhaler during this time and then to use only as needed once flare subsides.  - Referral to Pulmonology for further evaluation and management.  - Follow-up with primary provider as scheduled.  - predniSONE (DELTASONE) 10 MG tablet; Take 6 tablets (60 mg total) by mouth daily with breakfast for 1 day, THEN 5 tablets (50 mg total) daily with breakfast for 1 day, THEN 4 tablets (40 mg total) daily with breakfast for 1 day, THEN 3 tablets (30 mg total) daily with breakfast for 1 day, THEN 2 tablets (20 mg total) daily with breakfast for 1 day, THEN 1 tablet (10 mg total) daily with breakfast for 1 day.  Dispense: 21 tablet; Refill: 0 - Ambulatory referral to Pulmonology  2. Screening cholesterol level: - Will update lipid panel today.  - Lipid panel   Patient was given the opportunity to ask questions.  Patient verbalized understanding of the plan and was able to repeat key elements of the  plan. Patient was given clear instructions to go to Emergency Department or return to medical center if symptoms don't improve, worsen, or new problems develop.The patient verbalized understanding.   Orders Placed This Encounter  Procedures   Lipid panel   Ambulatory referral to Pulmonology     Requested Prescriptions   Signed Prescriptions Disp Refills   predniSONE (DELTASONE) 10 MG tablet 21 tablet 0    Sig: Take 6 tablets (60 mg total) by mouth daily with breakfast for 1 day, THEN 5 tablets (50 mg total) daily with breakfast for 1 day, THEN 4 tablets (40 mg total) daily with breakfast for 1 day, THEN 3 tablets (30 mg total) daily with breakfast for 1 day, THEN 2 tablets (20 mg total) daily with breakfast for 1 day, THEN 1 tablet (10 mg total) daily with breakfast for 1 day.    Follow-up with primary provider as scheduled.   Camillia Herter, NP

## 2021-05-04 ENCOUNTER — Ambulatory Visit (INDEPENDENT_AMBULATORY_CARE_PROVIDER_SITE_OTHER): Payer: Self-pay | Admitting: Family

## 2021-05-04 ENCOUNTER — Other Ambulatory Visit: Payer: Self-pay

## 2021-05-04 ENCOUNTER — Encounter: Payer: Self-pay | Admitting: Family

## 2021-05-04 ENCOUNTER — Encounter (INDEPENDENT_AMBULATORY_CARE_PROVIDER_SITE_OTHER): Payer: Self-pay

## 2021-05-04 VITALS — BP 129/89 | HR 65 | Resp 20 | Ht 73.5 in | Wt 189.0 lb

## 2021-05-04 DIAGNOSIS — J449 Chronic obstructive pulmonary disease, unspecified: Secondary | ICD-10-CM

## 2021-05-04 DIAGNOSIS — Z13220 Encounter for screening for lipoid disorders: Secondary | ICD-10-CM

## 2021-05-04 MED ORDER — PREDNISONE 10 MG PO TABS: ORAL_TABLET | ORAL | 0 refills | Status: AC

## 2021-05-04 NOTE — Progress Notes (Signed)
Hospital f/u- upper respiratory concerns x 1 month   F/u high cholesterol concerns

## 2021-05-05 ENCOUNTER — Other Ambulatory Visit: Payer: Self-pay | Admitting: Family

## 2021-05-05 DIAGNOSIS — E785 Hyperlipidemia, unspecified: Secondary | ICD-10-CM

## 2021-05-05 LAB — LIPID PANEL
Chol/HDL Ratio: 5.1 ratio — ABNORMAL HIGH (ref 0.0–5.0)
Cholesterol, Total: 211 mg/dL — ABNORMAL HIGH (ref 100–199)
HDL: 41 mg/dL (ref >39)
LDL Chol Calc (NIH): 137 mg/dL — ABNORMAL HIGH (ref 0–99)
Triglycerides: 186 mg/dL — ABNORMAL HIGH (ref 0–149)
VLDL Cholesterol Cal: 33 mg/dL (ref 5–40)

## 2021-05-05 MED ORDER — ATORVASTATIN CALCIUM 20 MG PO TABS: 20.0000 mg | ORAL_TABLET | Freq: Every day | ORAL | 0 refills | Status: DC

## 2021-05-05 NOTE — Progress Notes (Signed)
Please call patient with update.   Cholesterol higher than expected. High cholesterol may increase risk of heart attack and/or stroke. Consider eating more fruits, vegetables, and lean baked meats such as chicken or fish. Moderate intensity exercise at least 150 minutes as tolerated per week may help as well.   Begin Atorvastatin (Lipitor) for high cholesterol. Encouraged to recheck cholesterol at lab only appointment in 3 to 6 months.   The following is for provider reference only: The 10-year ASCVD risk score (Arnett DK, et al., 2019) is: 7.8%   Values used to calculate the score:     Age: 55 years     Sex: Male     Is Non-Hispanic African American: No     Diabetic: No     Tobacco smoker: No     Systolic Blood Pressure: 599 mmHg     Is BP treated: No     HDL Cholesterol: 41 mg/dL     Total Cholesterol: 211 mg/dL

## 2021-05-18 NOTE — Progress Notes (Signed)
Patient ID: Jerry Steele, male    DOB: [birth year]  MRN: [id number]  CC: Urgent Care Follow-Up  Subjective: Jerry Steele is a 55 y.o. male who presents for urgent care follow-up.   His concerns today include:   URGENT CARE FOLLOW-UP: Visit 05/12/2021 at [facility] per PA note: #1 asthmatic bronchitis with wheezing. He recently finished a course of prednisone and did improve however he has not been using his albuterol inhaler at all through this exacerbation. He was under the impression he only would use that if it were a true emergency. I encouraged him to continue Symbicort as before and start using albuterol 2 puffs every 6 hours. We will start doxycycline and plan for follow-up if not improving in 48 hours. He will follow-up immediately if he develops any increased shortness of breath, high fever, or any other new or worsening symptoms.   Visit 05/13/2021 at [facility] per PA note: Impression/Plan 1. Pressure sensation in both ears - methylPREDNISolone (MEDROL) 4 mg tablet; follow package directions Dispense: 21 tablet; Refill: 0  2. Nasal congestion - methylPREDNISolone (MEDROL) 4 mg tablet; follow package directions Dispense: 21 tablet; Refill: 0  Advised patient to continue doxycycline as directed.  No orders of the defined types were placed in this encounter.  1. Patient has been instructed on prescription and over-the-counter medications, dosages, side effects, and possible interactions as associated with each diagnosis in my impression and plan above.  2. I have educated patient on supportive care therapy associated with the diagnoses above.  3. Patient was instructed on when to follow up and know that they can follow up here, with their PCP, Urgent Care, ED. They have been instructed that if symptoms worse that should return to the clinic, go to the nearest ED, or activate EMS.  4. Red Flags associated with their  diagnoses were reviewed and patient was educated on what to do if red flags develop.  5. Patient agreed with plan and verbalized understanding.   Urgent Care Follow Up: Home Care   05/19/2021: Reports since recent Urgent Care visits has not gotten Prednisone related to financial concerns. Plans to purchase today at pharmacy as he was gifted money on yesterday. Reports has appointment with Pulmonology on next week. Declines chest xray, flu and Covid testing reports had recently at Urgent Care.  2. ERECTILE DYSFUNCTION: Requesting refills of Sildenafil.  Patient Active Problem List   Diagnosis Date Noted   Hyperlipidemia 05/05/2021   Rectal bleeding 11/15/2020   Acute bronchitis 08/30/2018   Spell of dizziness 03/14/2018   Viral upper respiratory illness 03/14/2018   Cough variant asthma 11/27/2017   Hypersensitivity pneumonitis ([place]) 09/06/2017   Cough 06/27/2017   Rib pain 06/27/2017     Current Outpatient Medications on File Prior to Visit  Medication Sig Dispense Refill   albuterol (VENTOLIN HFA) 108 (90 Base) MCG/ACT inhaler Inhale 1-2 puffs into the lungs every 6 (six) hours as needed for wheezing or shortness of breath. 18 g 0   atorvastatin (LIPITOR) 20 MG tablet Take 1 tablet (20 mg total) by mouth daily. 120 tablet 0   budesonide-formoterol (SYMBICORT) 160-4.5 MCG/ACT inhaler Inhale 2 puffs into the lungs 2 (two) times daily. 1 each 5   gabapentin (NEURONTIN) 100 MG capsule Take 1 capsule (100 mg total) by mouth at bedtime. 30 capsule 1   hydrocortisone (ANUSOL-HC) 25 MG suppository Place 1 suppository (25 mg total) rectally 2 (two) times daily. 12 suppository 0  lidocaine (XYLOCAINE) 5 % ointment Apply 1 application topically as needed. 35.44 g 0   No current facility-administered medications on file prior to visit.    No Known Allergies  Social History   Socioeconomic History   Marital status: Single    Spouse name: Not on file   Number of children: Not on file    Years of education: Not on file   Highest education level: Not on file  Occupational History   Not on file  Tobacco Use   Smoking status: Former    Packs/day: 0.25    Years: 30.00    Pack years: 7.50    Types: Cigarettes    Quit date: 12/16/2016    Years since quitting: 4.4   Smokeless tobacco: Never  Vaping Use   Vaping Use: Never used  Substance and Sexual Activity   Alcohol use: No    Alcohol/week: 0.0 standard drinks   Drug use: No   Sexual activity: Yes  Other Topics Concern   Not on file  Social History Narrative   Not on file   Social Determinants of Health   Financial Resource Strain: Not on file  Food Insecurity: Not on file  Transportation Needs: Not on file  Physical Activity: Not on file  Stress: Not on file  Social Connections: Not on file  Intimate Partner Violence: Not on file    Family History  Problem Relation Age of Onset   Colon cancer Neg Hx    Colon polyps Neg Hx    Esophageal cancer Neg Hx    Rectal cancer Neg Hx    Stomach cancer Neg Hx     Past Surgical History:  Procedure Laterality Date   HEMORRHOID SURGERY     25 years ago    ROS: Review of Systems Negative except as stated above  PHYSICAL EXAM: BP 129/86   Pulse 64   Resp 20   Wt 188 lb (85.3 kg)   SpO2 97%   BMI 24.47 kg/m   Physical Exam HENT:     Head: Normocephalic and atraumatic.  Eyes:     Extraocular Movements: Extraocular movements intact.     Conjunctiva/sclera: Conjunctivae normal.     Pupils: Pupils are equal, round, and reactive to light.  Cardiovascular:     Rate and Rhythm: Normal rate and regular rhythm.     Pulses: Normal pulses.     Heart sounds: Normal heart sounds.  Pulmonary:     Effort: Pulmonary effort is normal.     Breath sounds: Normal breath sounds.  Musculoskeletal:     Cervical back: Normal range of motion and neck supple.  Neurological:     General: No focal deficit present.     Mental Status: He is alert and oriented to person,  place, and time.  Psychiatric:        Mood and Affect: Mood normal.        Behavior: Behavior normal.   ASSESSMENT AND PLAN: 1. Chronic obstructive pulmonary disease, unspecified COPD type ([place]): 2. Asthmatic bronchitis with acute exacerbation, unspecified asthma severity, unspecified whether persistent: 3. Pressure sensation in both ears: 4. Nasal congestion: - Continue current regimen.  - Keep all scheduled appointments with Pulmonology.   5. Erectile dysfunction, unspecified erectile dysfunction type: - Continue Sildenafil as prescribed.  - Follow-up with primary provider as scheduled. - sildenafil (REVATIO) 20 MG tablet; Take 1 tablet 1/2 hour to 1 hour prior to intercourse as needed. Limit use to 1/2 tablet or 1 tablet  per 24 hours.  Dispense: 30 tablet; Refill: 2    Patient was given the opportunity to ask questions.  Patient verbalized understanding of the plan and was able to repeat key elements of the plan. Patient was given clear instructions to go to Emergency Department or return to medical center if symptoms don't improve, worsen, or new problems develop.The patient verbalized understanding.   Requested Prescriptions   Signed Prescriptions Disp Refills   sildenafil (REVATIO) 20 MG tablet 30 tablet 2    Sig: Take 1 tablet 1/2 hour to 1 hour prior to intercourse as needed. Limit use to 1/2 tablet or 1 tablet per 24 hours.    Follow-up with primary provider as scheduled.  Camillia Herter, NP

## 2021-05-19 ENCOUNTER — Encounter: Payer: Self-pay | Admitting: Family

## 2021-05-19 ENCOUNTER — Ambulatory Visit (INDEPENDENT_AMBULATORY_CARE_PROVIDER_SITE_OTHER): Payer: Self-pay | Admitting: Family

## 2021-05-19 ENCOUNTER — Encounter (INDEPENDENT_AMBULATORY_CARE_PROVIDER_SITE_OTHER): Payer: Self-pay

## 2021-05-19 ENCOUNTER — Other Ambulatory Visit: Payer: Self-pay

## 2021-05-19 VITALS — BP 129/86 | HR 64 | Resp 20 | Wt 188.0 lb

## 2021-05-19 DIAGNOSIS — J449 Chronic obstructive pulmonary disease, unspecified: Secondary | ICD-10-CM

## 2021-05-19 DIAGNOSIS — R0981 Nasal congestion: Secondary | ICD-10-CM

## 2021-05-19 DIAGNOSIS — J45901 Unspecified asthma with (acute) exacerbation: Secondary | ICD-10-CM

## 2021-05-19 DIAGNOSIS — H938X3 Other specified disorders of ear, bilateral: Secondary | ICD-10-CM

## 2021-05-19 DIAGNOSIS — N529 Male erectile dysfunction, unspecified: Secondary | ICD-10-CM

## 2021-05-19 MED ORDER — SILDENAFIL CITRATE 20 MG PO TABS: ORAL_TABLET | ORAL | 2 refills | Status: DC

## 2021-05-19 NOTE — Progress Notes (Signed)
Congestion  Cough with green phlegm  Refill on ED medication

## 2021-05-26 ENCOUNTER — Encounter: Payer: Self-pay | Admitting: Pulmonary Disease

## 2021-05-26 ENCOUNTER — Other Ambulatory Visit: Payer: Self-pay

## 2021-05-26 ENCOUNTER — Ambulatory Visit (INDEPENDENT_AMBULATORY_CARE_PROVIDER_SITE_OTHER): Payer: Self-pay | Admitting: Pulmonary Disease

## 2021-05-26 ENCOUNTER — Institutional Professional Consult (permissible substitution): Payer: Self-pay | Admitting: Pulmonary Disease

## 2021-05-26 VITALS — BP 122/74 | HR 68 | Ht 73.5 in | Wt 194.0 lb

## 2021-05-26 DIAGNOSIS — R053 Chronic cough: Secondary | ICD-10-CM

## 2021-05-26 DIAGNOSIS — J45991 Cough variant asthma: Secondary | ICD-10-CM

## 2021-05-26 NOTE — Progress Notes (Signed)
Subjective:    Patient ID: Jerry Steele, male    DOB: 04/09/1966, 55 y.o.   MRN: [id number]  Synopsis: referred in 2018 for evaluation of cough.  He was a smoker and lung function testing showed no evidence of airflow obstruction but a CT scan did show very mild emphysema. Recurrent cough well treated with zyrtec, sinus rinses, Flonase, omeprazole, ICS/LABA inhaler. Allergic rhinitis and likely asthma. Returns after 2 years lost to follow up 05/2021 with similar symptoms.  HPI Chief Complaint  Patient presents with   Establish Care    Former [facility] patient for chronic cough. States the cough has ramped up for the 2 months. Has also had back to back sinus infections and bronchitis. Post nasal drip.    Patient lasting a couple years ago.  Issues with cough, recurrent bronchitis.  Felt to be related to asthma.  Allergic rhinitis/sinus allergies as well.  Per documentation, most recent note from Dr. Lake Bells, reliably responded to strict adherence to omeprazole for GERD, cetirizine, sinus rinses, Flonase for postnasal drip, ICS/LABA therapy for asthma.  He states symptoms have been well controlled for couple years.  About 3 to 4 months ago cough returned.  Says he was diagnosed with pneumonia based on a chest x-ray.  Was given antibiotic.  Ever since then he has had persistent cough.  Throughout the day.  Worse at night.  Associate with significant sinus congestion.  Sinus pain, sinus pressure.  2 subsequent antibiotic courses for sinusitis per his report.  Helped temporarily.  Repeat chest x-ray in the last couple months at his PCP per his report and was told everything was clear on the chest x-ray.  He states he is taking Symbicort 2 puffs twice a day every day.  Not using his rescue inhaler.  He reports good adherence to omeprazole 1 pill in the morning.  He is not doing anything for nasal congestion.  Sounds like intermittently using a saline spray.  Discussed regimen in the past which  included sinus rinse, Flonase, cetirizine.  He is not doing that regularly.  He clear describes postnasal drip, mucus dripping the back as that is a trigger for his cough.  Cough is dry and productive.  No seasonal environmental factors he can identify.  No position to make things better or worse.  No other relieving or exacerbating factors.  Past Medical History:  Diagnosis Date   Arthritis    hand   Emphysema lung (HCC)    GERD (gastroesophageal reflux disease)    Pneumonia       Review of Systems  Constitutional:  Negative for fever and unexpected weight change.  HENT:  Positive for congestion. Negative for dental problem, ear pain, nosebleeds, postnasal drip, rhinorrhea, sinus pressure, sneezing, sore throat and trouble swallowing.   Eyes:  Negative for redness and itching.  Respiratory:  Positive for cough and shortness of breath. Negative for chest tightness and wheezing.   Cardiovascular:  Negative for palpitations and leg swelling.  Gastrointestinal:  Negative for nausea and vomiting.  Genitourinary:  Negative for dysuria.  Musculoskeletal:  Negative for joint swelling.  Skin:  Negative for rash.  Neurological:  Negative for headaches.  Hematological:  Does not bruise/bleed easily.  Psychiatric/Behavioral:  Negative for dysphoric mood. The patient is not nervous/anxious.       Objective:   Physical Exam Vitals:   05/26/21 1131  BP: 122/74  Pulse: 68  SpO2: 99%  Weight: 194 lb (88 kg)  Height: 6' 1.5" (1.867 m)  Gen: well appearing PULM: CTA B, normal work of breathing CV: RRR, no mgr, no edema GI: BS+, soft, nontender Derm: no cyanosis or rash Psych: normal mood and full affect   Labs: Reviewed, IgE within normal notes, eosinophils elevated as high as 300 on multiple occasions  Chest imaging: 03/2017 CXR reviewed, some bronchitis changes but otherwise normal, personally reviewed October 2018 CT chest: Normal pulmonary parenchyma, no evidence of lesion or  mass, images independently reviewed 07/2018 chest x-ray: Reviewed and interpreted as clear lungs, no evidence of hyperinflation  Spirometry: October 2018: Ratio 77%, FVC decreased, 53% predicted In November 2018 ratio 76%, FEV1 2.90 L 66% predicted, FVC 3.81 L 67% predicted, total lung capacity 5.84 L 75% predicted, DLCO 25.32 mL 67% predicted  Exhaled NO: 07/2017 on Dulera 11ppm  CBC    Component Value Date/Time   WBC 6.9 01/04/2021 1200   WBC 9.2 03/14/2018 1112   RBC 5.11 01/04/2021 1200   RBC 4.99 03/14/2018 1112   HGB 15.5 01/04/2021 1200   HCT 45.7 01/04/2021 1200   PLT 185 01/04/2021 1200   MCV 89 01/04/2021 1200   MCH 30.3 01/04/2021 1200   MCH 31.6 (A) 10/08/2015 1050   MCHC 33.9 01/04/2021 1200   MCHC 33.6 03/14/2018 1112   RDW 12.7 01/04/2021 1200   LYMPHSABS 1.9 11/11/2020 1842   MONOABS 0.6 03/14/2018 1112   EOSABS 0.3 11/11/2020 1842   BASOSABS 0.1 11/11/2020 1842   .      Assessment & Plan:   Chronic cough  Cough variant asthma  [name] returns to clinic after being lost to follow-up for couple years with cough that is multifactorial.  Recent worsening likely due to poorly controlled allergic rhinitis given his description of significant nasal congestion, sinus pressure, rhinorrhea, postnasal drip symptoms.  Possible poorly controlled asthma symptoms.  He reports good adherence to PPI.  Plan: Allergic rhinitis Take cetirizine 10 mg dailyTake fluticasone 2 sprays each nostril daily Use saline salt water rinses in your nose to help clear the mucus out  Recurrent cough with  asthma: Take Symbicort 2 puffs twice a day, albuterol 2 puffs 3 times a day x3 days then use as needed  Gastroesophageal reflux disease: Continue to take omeprazole 20 mg daily  Chronic cough: Resume sinus rinses, Flonase, cetirizine as above  Follow-up 4 weeks or sooner if needed     Current Outpatient Medications:    albuterol (VENTOLIN HFA) 108 (90 Base) MCG/ACT  inhaler, Inhale 1-2 puffs into the lungs every 6 (six) hours as needed for wheezing or shortness of breath., Disp: 18 g, Rfl: 0   atorvastatin (LIPITOR) 20 MG tablet, Take 1 tablet (20 mg total) by mouth daily., Disp: 120 tablet, Rfl: 0   budesonide-formoterol (SYMBICORT) 160-4.5 MCG/ACT inhaler, Inhale 2 puffs into the lungs 2 (two) times daily., Disp: 1 each, Rfl: 5   gabapentin (NEURONTIN) 100 MG capsule, Take 1 capsule (100 mg total) by mouth at bedtime., Disp: 30 capsule, Rfl: 1   hydrocortisone (ANUSOL-HC) 25 MG suppository, Place 1 suppository (25 mg total) rectally 2 (two) times daily., Disp: 12 suppository, Rfl: 0   lidocaine (XYLOCAINE) 5 % ointment, Apply 1 application topically as needed., Disp: 35.44 g, Rfl: 0   sildenafil (REVATIO) 20 MG tablet, Take 1 tablet 1/2 hour to 1 hour prior to intercourse as needed. Limit use to 1/2 tablet or 1 tablet per 24 hours., Disp: 30 tablet, Rfl: 2

## 2021-05-26 NOTE — Patient Instructions (Addendum)
Nice to meet you  For the cough and sinus congestion, I have the following recommendations:  1) continue Symbicort 2 puffs twice a day every day.  Rinse mouth out after every use  2) use albuterol scheduled 2 puffs 3 times a day for the next 3 days and then go back to using 2 puffs every 6 hours as needed for cough or shortness of breath  3) start taking an antihistamine, Zyrtec or cetirizine, 1 pill 10 mg every night  4) do sinus rinses with water/saline (salt packets), the large bottle twice a day-this is to thin out the mucus so that it can come out of the sinuses.  If you need a new when this can be obtained over-the-counter at a drugstore.  5) after the sinus rinse, use Flonase 1 spray each nostril twice a day  6) continue taking omeprazole for reflux or acid, every day in the morning before breakfast  Let see if the cough gets better.  If not we may need to talk about additional medications for asthma.  Fortunately, you have no wheeze today but understand that she wheeze from time to time.  Return to clinic in 4 weeks or sooner as needed with Dr. Silas Flood

## 2021-06-03 ENCOUNTER — Emergency Department (HOSPITAL_BASED_OUTPATIENT_CLINIC_OR_DEPARTMENT_OTHER): Payer: Self-pay

## 2021-06-03 ENCOUNTER — Other Ambulatory Visit: Payer: Self-pay

## 2021-06-03 ENCOUNTER — Encounter (HOSPITAL_BASED_OUTPATIENT_CLINIC_OR_DEPARTMENT_OTHER): Payer: Self-pay | Admitting: Emergency Medicine

## 2021-06-03 ENCOUNTER — Emergency Department (HOSPITAL_BASED_OUTPATIENT_CLINIC_OR_DEPARTMENT_OTHER)
Admission: EM | Admit: 2021-06-03 | Discharge: 2021-06-04 | Disposition: A | Discharge status: Home / Self Care | Payer: Self-pay | Attending: Emergency Medicine | Admitting: Emergency Medicine

## 2021-06-03 DIAGNOSIS — Z7951 Long term (current) use of inhaled steroids: Secondary | ICD-10-CM | POA: Insufficient documentation

## 2021-06-03 DIAGNOSIS — Z87891 Personal history of nicotine dependence: Secondary | ICD-10-CM | POA: Insufficient documentation

## 2021-06-03 DIAGNOSIS — X58XXXA Exposure to other specified factors, initial encounter: Secondary | ICD-10-CM | POA: Insufficient documentation

## 2021-06-03 DIAGNOSIS — R079 Chest pain, unspecified: Secondary | ICD-10-CM

## 2021-06-03 DIAGNOSIS — R053 Chronic cough: Secondary | ICD-10-CM | POA: Insufficient documentation

## 2021-06-03 DIAGNOSIS — T148XXA Other injury of unspecified body region, initial encounter: Secondary | ICD-10-CM

## 2021-06-03 DIAGNOSIS — S29011A Strain of muscle and tendon of front wall of thorax, initial encounter: Secondary | ICD-10-CM | POA: Insufficient documentation

## 2021-06-03 DIAGNOSIS — S29012A Strain of muscle and tendon of back wall of thorax, initial encounter: Secondary | ICD-10-CM | POA: Insufficient documentation

## 2021-06-03 DIAGNOSIS — R0789 Other chest pain: Secondary | ICD-10-CM

## 2021-06-03 LAB — BASIC METABOLIC PANEL
Anion gap: 8 (ref 5–15)
BUN: 17 mg/dL (ref 6–20)
CO2: 28 mmol/L (ref 22–32)
Calcium: 9.5 mg/dL (ref 8.9–10.3)
Chloride: 101 mmol/L (ref 98–111)
Creatinine, Ser: 0.86 mg/dL (ref 0.61–1.24)
GFR, Estimated: >60 mL/min (ref >60)
Glucose, Bld: 91 mg/dL (ref 70–99)
Potassium: 3.9 mmol/L (ref 3.5–5.1)
Sodium: 137 mmol/L (ref 135–145)

## 2021-06-03 LAB — CBC
HCT: 44.5 % (ref 39.0–52.0)
Hemoglobin: 15.1 g/dL (ref 13.0–17.0)
MCH: 29.9 pg (ref 26.0–34.0)
MCHC: 33.9 g/dL (ref 30.0–36.0)
MCV: 88.1 fL (ref 80.0–100.0)
Platelets: 208 10*3/uL (ref 150–400)
RBC: 5.05 MIL/uL (ref 4.22–5.81)
RDW: 12.8 % (ref 11.5–15.5)
WBC: 9.1 10*3/uL (ref 4.0–10.5)
nRBC: 0 % (ref 0.0–0.2)

## 2021-06-03 LAB — TROPONIN I (HIGH SENSITIVITY)
Troponin I (High Sensitivity): 3 ng/L (ref <18)
Troponin I (High Sensitivity): 3 ng/L (ref <18)

## 2021-06-03 NOTE — ED Triage Notes (Signed)
Pt presents to ED POV. Pt c/o L CP that began today w/ exertion. Pt reports that he had L arm pain yesterday. No cardiac hx.

## 2021-06-03 NOTE — ED Provider Notes (Signed)
[facility]ER[facility] DEPT Provider Note  CSN: [id number] Arrival date & time: 06/03/21 1953  Chief Complaint(s) Chest Pain  HPI Jerry Steele is a 55 y.o. male    Chest Pain Pain location:  L lateral chest Pain quality: stabbing   Pain radiates to:  Does not radiate Pain severity:  Moderate Onset quality:  Sudden Duration: several days. Timing:  Sporadic Progression:  Waxing and waning Chronicity:  Recurrent Relieved by:  Nothing (self resolves) Worsened by:  Certain positions and movement Associated symptoms: cough (chronic)   Associated symptoms: no fatigue, no fever, no lower extremity edema, no nausea, no shortness of breath, no vomiting and no weakness    Past Medical History Past Medical History:  Diagnosis Date   Arthritis    hand   Emphysema lung ([place])    GERD (gastroesophageal reflux disease)    Pneumonia    Patient Active Problem List   Diagnosis Date Noted   Hyperlipidemia 05/05/2021   Rectal bleeding 11/15/2020   Acute bronchitis 08/30/2018   Spell of dizziness 03/14/2018   Viral upper respiratory illness 03/14/2018   Cough variant asthma 11/27/2017   Cough 06/27/2017   Rib pain 06/27/2017   Home Medication(s) Prior to Admission medications   Medication Sig Start Date End Date Taking? Authorizing Provider  albuterol (VENTOLIN HFA) 108 (90 Base) MCG/ACT inhaler Inhale 1-2 puffs into the lungs every 6 (six) hours as needed for wheezing or shortness of breath. 04/08/21   Fenton Foy, NP  atorvastatin (LIPITOR) 20 MG tablet Take 1 tablet (20 mg total) by mouth daily. 05/05/21 09/02/21  Camillia Herter, NP  budesonide-formoterol (SYMBICORT) 160-4.5 MCG/ACT inhaler Inhale 2 puffs into the lungs 2 (two) times daily. 04/08/21   Fenton Foy, NP  gabapentin (NEURONTIN) 100 MG capsule Take 1 capsule (100 mg total) by mouth at bedtime. 01/04/21   Camillia Herter, NP  hydrocortisone (ANUSOL-HC) 25 MG suppository Place 1 suppository  (25 mg total) rectally 2 (two) times daily. 11/11/20   Lamptey, Myrene Galas, MD  lidocaine (XYLOCAINE) 5 % ointment Apply 1 application topically as needed. 11/11/20   LampteyMyrene Galas, MD  sildenafil (REVATIO) 20 MG tablet Take 1 tablet 1/2 hour to 1 hour prior to intercourse as needed. Limit use to 1/2 tablet or 1 tablet per 24 hours. 05/19/21   Camillia Herter, NP                                                                                                                                    Past Surgical History Past Surgical History:  Procedure Laterality Date   HEMORRHOID SURGERY     25 years ago   Family History Family History  Problem Relation Age of Onset   Colon cancer Neg Hx    Colon polyps Neg Hx    Esophageal cancer Neg Hx    Rectal cancer Neg Hx    Stomach  cancer Neg Hx     Social History Social History   Tobacco Use   Smoking status: Former    Packs/day: 0.25    Years: 30.00    Pack years: 7.50    Types: Cigarettes    Quit date: 12/16/2016    Years since quitting: 4.4   Smokeless tobacco: Never  Vaping Use   Vaping Use: Never used  Substance Use Topics   Alcohol use: No    Alcohol/week: 0.0 standard drinks   Drug use: No   Allergies Patient has no known allergies.  Review of Systems Review of Systems  Constitutional:  Negative for fatigue and fever.  Respiratory:  Positive for cough (chronic). Negative for shortness of breath.   Cardiovascular:  Positive for chest pain.  Gastrointestinal:  Negative for nausea and vomiting.  Neurological:  Negative for weakness.  All other systems are reviewed and are negative for acute change except as noted in the HPI  Physical Exam Vital Signs  I have reviewed the triage vital signs BP (!) 129/92   Pulse 60   Temp 97.7 F (36.5 C) (Oral)   Resp 18   Ht 6\' 3"  (1.905 m)   Wt 86.2 kg   SpO2 97%   BMI 23.75 kg/m   Physical Exam Vitals reviewed.  Constitutional:      General: He is not in acute distress.     Appearance: He is well-developed. He is not diaphoretic.  HENT:     Head: Normocephalic and atraumatic.     Nose: Nose normal.  Eyes:     General: No scleral icterus.       Right eye: No discharge.        Left eye: No discharge.     Conjunctiva/sclera: Conjunctivae normal.     Pupils: Pupils are equal, round, and reactive to light.  Cardiovascular:     Rate and Rhythm: Normal rate and regular rhythm.     Heart sounds: No murmur heard.   No friction rub. No gallop.  Pulmonary:     Effort: Pulmonary effort is normal. No respiratory distress.     Breath sounds: Normal breath sounds. No stridor. No rales.  Chest:     Chest wall: Tenderness present.    Abdominal:     General: There is no distension.     Palpations: Abdomen is soft.     Tenderness: There is no abdominal tenderness.  Musculoskeletal:     Cervical back: Normal range of motion and neck supple.     Thoracic back: Spasms and tenderness present.       Back:  Skin:    General: Skin is warm and dry.     Findings: No erythema or rash.  Neurological:     Mental Status: He is alert and oriented to person, place, and time.    ED Results and Treatments Labs (all labs ordered are listed, but only abnormal results are displayed) Labs Reviewed  BASIC METABOLIC PANEL  CBC  TROPONIN I (HIGH SENSITIVITY)  TROPONIN I (HIGH SENSITIVITY)  EKG  EKG Interpretation  Date/Time:  Friday June 03 2021 21:01:58 EST Ventricular Rate:  63 PR Interval:  159 QRS Duration: 113 QT Interval:  405 QTC Calculation: 415 R Axis:   51 Text Interpretation: Sinus rhythm Incomplete right bundle branch block No acute changes Confirmed by Addison Lank [id number]) on 06/03/2021 11:24:52 PM       Radiology DG Chest Port 1 View  Result Date: 06/03/2021 CLINICAL DATA:  Chest pain EXAM: PORTABLE CHEST 1 VIEW COMPARISON:   August 08, 2018 FINDINGS: The heart size and mediastinal contours are within normal limits. Both lungs are clear. The visualized skeletal structures are unremarkable. IMPRESSION: No active disease. Electronically Signed   By: Dorise Bullion III M.D.   On: 06/03/2021 20:49    Pertinent labs & imaging results that were available during my care of the patient were reviewed by me and considered in my medical decision making (see MDM for details).  Medications Ordered in ED Medications - No data to display                                                                                                                                   Procedures Procedures  (including critical care time)  Medical Decision Making / ED Course I have reviewed the nursing notes for this encounter and the patient's prior records (if available in EHR or on provided paperwork).  Luie Laneve was evaluated in Emergency Department on 06/04/2021 for the symptoms described in the history of present illness. He was evaluated in the context of the global COVID-19 pandemic, which necessitated consideration that the patient might be at risk for infection with the SARS-CoV-2 virus that causes COVID-19. Institutional protocols and algorithms that pertain to the evaluation of patients at risk for COVID-19 are in a state of rapid change based on information released by regulatory bodies including the CDC and federal and state organizations. These policies and algorithms were followed during the patient's care in the ED.     Highly atypical chest pain EKG w/o acute ischemic changes or evidence of pericarditis. HEART <3. Serial trops negative, ruling out ACS.  Doubt PE. Not classic for dissection or esophageal perforation. Chest x-ray without evidence suggestive of pneumonia, pneumothorax, pneumomediastinum.  No abnormal contour of the mediastinum to suggest dissection. No evidence of acute injuries.   Pertinent labs &  imaging results that were available during my care of the patient were reviewed by me and considered in my medical decision making:   Final Clinical Impression(s) / ED Diagnoses Final diagnoses:  Intermittent left-sided chest pain  Muscle strain   The patient appears reasonably screened and/or stabilized for discharge and I doubt any other medical condition or other [facility] requiring further screening, evaluation, or treatment in the ED at this time prior to discharge. Safe for discharge with strict return precautions.  Disposition: Discharge  Condition: Good  I have discussed the results, Dx and  Tx plan with the patient/family who expressed understanding and agree(s) with the plan. Discharge instructions discussed at length. The patient/family was given strict return precautions who verbalized understanding of the instructions. No further questions at time of discharge.    ED Discharge Orders     None        Follow Up: Camillia Herter, NP [place] [address] [phone]  Call  to schedule an appointment for close follow up     This chart was dictated using voice recognition software.  Despite best efforts to proofread,  errors can occur which can change the documentation meaning.    Fatima Blank, MD 06/04/21 [phone]

## 2021-06-23 ENCOUNTER — Other Ambulatory Visit: Payer: Self-pay

## 2021-06-23 ENCOUNTER — Encounter: Payer: Self-pay | Admitting: Pulmonary Disease

## 2021-06-23 ENCOUNTER — Ambulatory Visit (INDEPENDENT_AMBULATORY_CARE_PROVIDER_SITE_OTHER): Payer: Self-pay | Admitting: Pulmonary Disease

## 2021-06-23 VITALS — BP 118/72 | HR 78 | Temp 98.0°F | Ht 75.0 in | Wt 195.8 lb

## 2021-06-23 DIAGNOSIS — J45991 Cough variant asthma: Secondary | ICD-10-CM

## 2021-06-23 DIAGNOSIS — R053 Chronic cough: Secondary | ICD-10-CM

## 2021-06-23 NOTE — Patient Instructions (Addendum)
Nice to see you again  I am sorry the cough is not much better but I agree it seems that may be your uncle's house is contributing to the cough.  No changes to medication, continue Zyrtec, sinus rinse, Flonase, omeprazole, Symbicort as prescribed.  Return to clinic in 6 months or sooner as needed with Dr. Silas Flood

## 2021-06-23 NOTE — Progress Notes (Signed)
Subjective:    Patient ID: Jerry Steele, male    DOB: 05/21/1966, 55 y.o.   MRN: [id number]  Synopsis: referred in 2018 for evaluation of cough.  He was a smoker and lung function testing showed no evidence of airflow obstruction but a CT scan did show very mild emphysema. Recurrent cough well treated with zyrtec, sinus rinses, Flonase, omeprazole, ICS/LABA inhaler. Allergic rhinitis and likely asthma. Returns after 2 years lost to follow up 05/2021 with similar symptoms.  HPI Chief Complaint  Patient presents with   Follow-up    Pt states that his cough is still there its more present in the early morning and at night. Sometime he has phlegm and sometimes not.    Returned for follow up of chronic cough. Seen few weeks ago and was advised to resume Zyrtec, sinus rinses, flonase. Advised to continue symbicort and PPI.  Cough largely unchanged.  At first he endorses good adherence to this medication regimen.  Later on further questioning it is unclear if he is taking everything as prescribed.  Further history today that he endorses that when he is at his home (lives with uncle) his cough is much worse.  He was at work, cough seems improved.  Seems like this environmental factor is a major contributor to sinus congestion and cough per his report.  He has plans to move out with his son in the coming weeks to months.  HPI at initial visit:  Patient last seen a couple years ago.  Issues with cough, recurrent bronchitis.  Felt to be related to asthma.  Allergic rhinitis/sinus allergies as well.  Per documentation, most recent note from Dr. Lake Bells, reliably responded to strict adherence to omeprazole for GERD, cetirizine, sinus rinses, Flonase for postnasal drip, ICS/LABA therapy for asthma.  He states symptoms have been well controlled for couple years.  About 3 to 4 months ago cough returned.  Says he was diagnosed with pneumonia based on a chest x-ray.  Was given antibiotic.  Ever since  then he has had persistent cough.  Throughout the day.  Worse at night.  Associate with significant sinus congestion.  Sinus pain, sinus pressure.  2 subsequent antibiotic courses for sinusitis per his report.  Helped temporarily.  Repeat chest x-ray in the last couple months at his PCP per his report and was told everything was clear on the chest x-ray.  He states he is taking Symbicort 2 puffs twice a day every day.  Not using his rescue inhaler.  He reports good adherence to omeprazole 1 pill in the morning.  He is not doing anything for nasal congestion.  Sounds like intermittently using a saline spray.  Discussed regimen in the past which included sinus rinse, Flonase, cetirizine.  He is not doing that regularly.  He clear describes postnasal drip, mucus dripping the back as that is a trigger for his cough.  Cough is dry and productive.  No seasonal environmental factors he can identify.  No position to make things better or worse.  No other relieving or exacerbating factors.  Past Medical History:  Diagnosis Date   Arthritis    hand   Emphysema lung ([facility])    GERD (gastroesophageal reflux disease)    Pneumonia       Review of Systems: N/a     Objective:   Physical Exam Vitals:   06/23/21 0916  BP: 118/72  Pulse: 78  Temp: 98 F (36.7 C)  TempSrc: Oral  SpO2: 96%  Weight: 195  lb 12.8 oz (88.8 kg)  Height: 6\' 3"  (1.905 m)    Gen: well appearing PULM: CTA B, normal work of breathing CV: RRR, no mgr, no edema GI: BS+, soft, nontender Derm: no cyanosis or rash Psych: normal mood and full affect   Labs: Reviewed, IgE within normal notes, eosinophils elevated as high as 300 on multiple occasions  Chest imaging: 03/2017 CXR reviewed, some bronchitis changes but otherwise normal, personally reviewed October 2018 CT chest: Normal pulmonary parenchyma, no evidence of lesion or mass, images independently reviewed 07/2018 chest x-ray: Reviewed and interpreted as clear lungs, no  evidence of hyperinflation  Spirometry: October 2018: Ratio 77%, FVC decreased, 53% predicted In November 2018 ratio 76%, FEV1 2.90 L 66% predicted, FVC 3.81 L 67% predicted, total lung capacity 5.84 L 75% predicted, DLCO 25.32 mL 67% predicted  Exhaled NO: 07/2017 on Dulera 11ppm  CBC    Component Value Date/Time   WBC 9.1 06/03/2021 2028   RBC 5.05 06/03/2021 2028   HGB 15.1 06/03/2021 2028   HGB 15.5 01/04/2021 1200   HCT 44.5 06/03/2021 2028   HCT 45.7 01/04/2021 1200   PLT 208 06/03/2021 2028   PLT 185 01/04/2021 1200   MCV 88.1 06/03/2021 2028   MCV 89 01/04/2021 1200   MCH 29.9 06/03/2021 2028   MCHC 33.9 06/03/2021 2028   RDW 12.8 06/03/2021 2028   RDW 12.7 01/04/2021 1200   LYMPHSABS 1.9 11/11/2020 1842   MONOABS 0.6 03/14/2018 1112   EOSABS 0.3 11/11/2020 1842   BASOSABS 0.1 11/11/2020 1842   .      Assessment & Plan:   Chronic cough  Cough variant asthma  [name] returns to clinic after being lost to follow-up for couple years with cough that is multifactorial.  Recent worsening likely due to poorly controlled allergic rhinitis given his description of significant nasal congestion, sinus pressure, rhinorrhea, postnasal drip symptoms.  Possible poorly controlled asthma symptoms.  He reports good adherence to PPI.  Plan: Allergic rhinitis Continue cetirizine 10 mg daily take fluticasone 2 sprays each nostril daily Use saline salt water rinses in your nose to help clear the mucus out  Recurrent cough with  asthma: Continue Symbicort 2 puffs twice a day, albuterol 2 puffs q6 hrs as needed Eos 300 4/22, consider steroid trial, biologics in the future  Gastroesophageal reflux disease: Continue to take omeprazole 20 mg daily  Chronic cough: Continue sinus rinses, Flonase, cetirizine as above Consider steroid trial, biologics in the future  Follow-up 6 months or sooner as needed     Current Outpatient Medications:    albuterol (VENTOLIN HFA) 108  (90 Base) MCG/ACT inhaler, Inhale 1-2 puffs into the lungs every 6 (six) hours as needed for wheezing or shortness of breath., Disp: 18 g, Rfl: 0   atorvastatin (LIPITOR) 20 MG tablet, Take 1 tablet (20 mg total) by mouth daily., Disp: 120 tablet, Rfl: 0   budesonide-formoterol (SYMBICORT) 160-4.5 MCG/ACT inhaler, Inhale 2 puffs into the lungs 2 (two) times daily., Disp: 1 each, Rfl: 5   gabapentin (NEURONTIN) 100 MG capsule, Take 1 capsule (100 mg total) by mouth at bedtime., Disp: 30 capsule, Rfl: 1   hydrocortisone (ANUSOL-HC) 25 MG suppository, Place 1 suppository (25 mg total) rectally 2 (two) times daily., Disp: 12 suppository, Rfl: 0   lidocaine (XYLOCAINE) 5 % ointment, Apply 1 application topically as needed., Disp: 35.44 g, Rfl: 0   sildenafil (REVATIO) 20 MG tablet, Take 1 tablet 1/2 hour to 1 hour prior  to intercourse as needed. Limit use to 1/2 tablet or 1 tablet per 24 hours., Disp: 30 tablet, Rfl: 2

## 2021-07-17 ENCOUNTER — Emergency Department (HOSPITAL_BASED_OUTPATIENT_CLINIC_OR_DEPARTMENT_OTHER): Payer: Self-pay | Admitting: Radiology

## 2021-07-17 ENCOUNTER — Emergency Department (HOSPITAL_BASED_OUTPATIENT_CLINIC_OR_DEPARTMENT_OTHER)
Admission: EM | Admit: 2021-07-17 | Discharge: 2021-07-17 | Disposition: A | Discharge status: Home / Self Care | Payer: Self-pay | Attending: Emergency Medicine | Admitting: Emergency Medicine

## 2021-07-17 ENCOUNTER — Emergency Department (HOSPITAL_BASED_OUTPATIENT_CLINIC_OR_DEPARTMENT_OTHER): Payer: Self-pay

## 2021-07-17 DIAGNOSIS — Z20822 Contact with and (suspected) exposure to covid-19: Secondary | ICD-10-CM | POA: Insufficient documentation

## 2021-07-17 DIAGNOSIS — R0789 Other chest pain: Secondary | ICD-10-CM

## 2021-07-17 DIAGNOSIS — J40 Bronchitis, not specified as acute or chronic: Secondary | ICD-10-CM | POA: Insufficient documentation

## 2021-07-17 DIAGNOSIS — R6889 Other general symptoms and signs: Secondary | ICD-10-CM

## 2021-07-17 DIAGNOSIS — J449 Chronic obstructive pulmonary disease, unspecified: Secondary | ICD-10-CM | POA: Insufficient documentation

## 2021-07-17 DIAGNOSIS — R079 Chest pain, unspecified: Secondary | ICD-10-CM

## 2021-07-17 DIAGNOSIS — Z7951 Long term (current) use of inhaled steroids: Secondary | ICD-10-CM | POA: Insufficient documentation

## 2021-07-17 LAB — BASIC METABOLIC PANEL
Anion gap: 8 (ref 5–15)
BUN: 19 mg/dL (ref 6–20)
CO2: 24 mmol/L (ref 22–32)
Calcium: 9 mg/dL (ref 8.9–10.3)
Chloride: 104 mmol/L (ref 98–111)
Creatinine, Ser: 0.86 mg/dL (ref 0.61–1.24)
GFR, Estimated: >60 mL/min (ref >60)
Glucose, Bld: 119 mg/dL — ABNORMAL HIGH (ref 70–99)
Potassium: 3.9 mmol/L (ref 3.5–5.1)
Sodium: 136 mmol/L (ref 135–145)

## 2021-07-17 LAB — RESP PANEL BY RT-PCR (FLU A&B, COVID) ARPGX2
Influenza A by PCR: NEGATIVE
Influenza B by PCR: NEGATIVE
SARS Coronavirus 2 by RT PCR: NEGATIVE

## 2021-07-17 LAB — CBC
HCT: 42.3 % (ref 39.0–52.0)
Hemoglobin: 14.1 g/dL (ref 13.0–17.0)
MCH: 29.7 pg (ref 26.0–34.0)
MCHC: 33.3 g/dL (ref 30.0–36.0)
MCV: 89.1 fL (ref 80.0–100.0)
Platelets: 169 10*3/uL (ref 150–400)
RBC: 4.75 MIL/uL (ref 4.22–5.81)
RDW: 13 % (ref 11.5–15.5)
WBC: 11 10*3/uL — ABNORMAL HIGH (ref 4.0–10.5)
nRBC: 0 % (ref 0.0–0.2)

## 2021-07-17 LAB — TROPONIN I (HIGH SENSITIVITY)
Troponin I (High Sensitivity): <2 ng/L (ref <18)
Troponin I (High Sensitivity): <2 ng/L (ref <18)

## 2021-07-17 MED ORDER — ALBUTEROL SULFATE HFA 108 (90 BASE) MCG/ACT IN AERS: 2.0000 | INHALATION_SPRAY | Freq: Four times a day (QID) | RESPIRATORY_TRACT | Status: DC | PRN

## 2021-07-17 MED ORDER — ALBUTEROL SULFATE HFA 108 (90 BASE) MCG/ACT IN AERS: 2.0000 | INHALATION_SPRAY | Freq: Four times a day (QID) | RESPIRATORY_TRACT | Status: DC

## 2021-07-17 MED ORDER — ALBUTEROL SULFATE HFA 108 (90 BASE) MCG/ACT IN AERS
INHALATION_SPRAY | RESPIRATORY_TRACT | Status: AC
  Administered 2021-07-17: 2 via RESPIRATORY_TRACT
  Filled 2021-07-17: qty 6.7

## 2021-07-17 MED ORDER — AZITHROMYCIN 250 MG PO TABS: 250.0000 mg | ORAL_TABLET | Freq: Every day | ORAL | 0 refills | Status: DC

## 2021-07-17 MED ORDER — AEROCHAMBER PLUS FLO-VU MISC
1.0000 | Freq: Once | Status: AC
  Administered 2021-07-17: 1
  Filled 2021-07-17: qty 1

## 2021-07-17 NOTE — Discharge Instructions (Signed)
Use albuterol inhaler 2 puffs every 6 hours.  Take the azithromycin Z-Pak as directed.  Your flu and COVID testing was negative.  Chest x-ray showed no signs of pneumonia.  But seem to be consistent with a bronchitis.  Make an appointment to follow-up with your doctor.  Work note provided.

## 2021-07-17 NOTE — ED Provider Notes (Signed)
[facility] DE[facility] Provider Note   CSN: [id number] Arrival date & time: 07/17/21  1541     History  Chief Complaint  Patient presents with   URI   Chest Pain    Jerry Steele is a 56 y.o. male.  Patient with a complaint of cough and congestion some headache and body aches that is actually according to the person with him going on for greater than 1 week.  Denies any fever vomiting or diarrhea no abdominal pain.  Has chest pain that is worse with coughing suggestive of chest wall pain.  Also does state that he has had some intermittent nausea.  Past medical history is significant for pneumonia emphysema gastroesophageal reflux.  Patient was a smoker.  Quit in 2018.  Chart review shows the patient was seen for intermittent left-sided chest pain November 18 with negative work-up.  Seen in March 2022 with COPD exacerbation.      Home Medications Prior to Admission medications   Medication Sig Start Date End Date Taking? Authorizing Provider  azithromycin (ZITHROMAX) 250 MG tablet Take 1 tablet (250 mg total) by mouth daily. Take first 2 tablets together, then 1 every day until finished. 07/17/21  Yes Fredia Sorrow, MD  albuterol (VENTOLIN HFA) 108 (90 Base) MCG/ACT inhaler Inhale 1-2 puffs into the lungs every 6 (six) hours as needed for wheezing or shortness of breath. 04/08/21   Fenton Foy, NP  atorvastatin (LIPITOR) 20 MG tablet Take 1 tablet (20 mg total) by mouth daily. 05/05/21 09/02/21  Camillia Herter, NP  budesonide-formoterol (SYMBICORT) 160-4.5 MCG/ACT inhaler Inhale 2 puffs into the lungs 2 (two) times daily. 04/08/21   Fenton Foy, NP  gabapentin (NEURONTIN) 100 MG capsule Take 1 capsule (100 mg total) by mouth at bedtime. 01/04/21   Camillia Herter, NP  hydrocortisone (ANUSOL-HC) 25 MG suppository Place 1 suppository (25 mg total) rectally 2 (two) times daily. 11/11/20   Lamptey, Myrene Galas, MD  lidocaine (XYLOCAINE) 5 % ointment Apply 1  application topically as needed. 11/11/20   LampteyMyrene Galas, MD  sildenafil (REVATIO) 20 MG tablet Take 1 tablet 1/2 hour to 1 hour prior to intercourse as needed. Limit use to 1/2 tablet or 1 tablet per 24 hours. 05/19/21   Camillia Herter, NP      Allergies    Patient has no known allergies.    Review of Systems   Review of Systems  Constitutional:  Negative for chills and fever.  HENT:  Positive for congestion. Negative for ear pain and sore throat.   Eyes:  Negative for pain and visual disturbance.  Respiratory:  Positive for cough. Negative for shortness of breath.   Cardiovascular:  Positive for chest pain. Negative for palpitations and leg swelling.  Gastrointestinal:  Positive for nausea. Negative for abdominal pain, diarrhea and vomiting.  Genitourinary:  Negative for dysuria and hematuria.  Musculoskeletal:  Positive for myalgias. Negative for arthralgias and back pain.  Skin:  Negative for color change and rash.  Neurological:  Negative for seizures and syncope.  All other systems reviewed and are negative.  Physical Exam Updated Vital Signs BP (!) 129/98    Pulse 61    Temp 97.6 F (36.4 C)    Resp 18    Ht 1.905 m (6\' 3" )    Wt 86.2 kg    SpO2 100%    BMI 23.75 kg/m  Physical Exam Vitals and nursing note reviewed.  Constitutional:  General: He is not in acute distress.    Appearance: Normal appearance. He is well-developed.  HENT:     Head: Normocephalic and atraumatic.  Eyes:     Extraocular Movements: Extraocular movements intact.     Conjunctiva/sclera: Conjunctivae normal.     Pupils: Pupils are equal, round, and reactive to light.  Cardiovascular:     Rate and Rhythm: Normal rate and regular rhythm.     Heart sounds: No murmur heard. Pulmonary:     Effort: Pulmonary effort is normal. No respiratory distress.     Breath sounds: Normal breath sounds. No wheezing, rhonchi or rales.  Abdominal:     Palpations: Abdomen is soft.     Tenderness: There is no  abdominal tenderness.  Musculoskeletal:        General: No swelling.     Cervical back: Normal range of motion and neck supple.  Skin:    General: Skin is warm and dry.     Capillary Refill: Capillary refill takes less than 2 seconds.  Neurological:     General: No focal deficit present.     Mental Status: He is alert and oriented to person, place, and time.     Cranial Nerves: No cranial nerve deficit.     Sensory: No sensory deficit.     Motor: No weakness.  Psychiatric:        Mood and Affect: Mood normal.    ED Results / Procedures / Treatments   Labs (all labs ordered are listed, but only abnormal results are displayed) Labs Reviewed  BASIC METABOLIC PANEL - Abnormal; Notable for the following components:      Result Value   Glucose, Bld 119 (*)    All other components within normal limits  CBC - Abnormal; Notable for the following components:   WBC 11.0 (*)    All other components within normal limits  RESP PANEL BY RT-PCR (FLU A&B, COVID) ARPGX2  TROPONIN I (HIGH SENSITIVITY)  TROPONIN I (HIGH SENSITIVITY)    EKG EKG Interpretation  Date/Time:  Sunday July 17 2021 15:59:29 EST Ventricular Rate:  67 PR Interval:  160 QRS Duration: 98 QT Interval:  380 QTC Calculation: 401 R Axis:   65 Text Interpretation: Normal sinus rhythm Incomplete right bundle branch block Borderline ECG When compared with ECG of 03-Jun-2021 21:01, PREVIOUS ECG IS PRESENT Confirmed by Fredia Sorrow [id number]) on 07/17/2021 7:38:17 PM  Radiology DG Chest Port 1 View  Result Date: 07/17/2021 CLINICAL DATA:  Cough, congestion and headache. EXAM: PORTABLE CHEST 1 VIEW COMPARISON:  06/03/2021 FINDINGS: The cardiac silhouette, mediastinal and hilar contours are within normal limits given the AP projection and portable technique. Low lung volumes with vascular crowding and streaky atelectasis. Mild peribronchial thickening could suggest bronchitis. No infiltrates or effusions. No pulmonary lesions.  IMPRESSION: 1. Low lung volumes with vascular crowding and streaky atelectasis. No pulmonary infiltrates or pleural effusions. 2. Possible bronchitis. Electronically Signed   By: Marijo Sanes M.D.   On: 07/17/2021 16:58    Procedures Procedures    Medications Ordered in ED Medications  albuterol (VENTOLIN HFA) 108 (90 Base) MCG/ACT inhaler 2 puff (has no administration in time range)    ED Course/ Medical Decision Making/ A&P                           Medical Decision Making  Patient's symptoms consistent with flulike illness.  However COVID and influenza testing is negative.  Patient's  initial troponin is less than 2 but the chest pain seems to be consistent with chest wall pain.  Basic metabolic panel without any acute abnormalities other than slightly elevated glucose of 119.  CBC consistent with a white count of 11,000 so slightly elevated.  Hemoglobin is 14.1.  Chest x-ray shows low lung volumes with vascular crowding and streaky atelectasis no pulmonary infiltrates or pulm or pleural effusion.  I think it is most consistent with bronchitis.  Do not think it is pulmonary edema.  Treat patient with albuterol inhaler and probably give a course of antibiotic.  Still somewhat flulike illness.  And bronchitis.  Patient's delta troponins are both very normal.  As stated above we will treat as a viral upper respiratory infection with a component of bronchitis.  So we will do a Z-Pak since he is got a history of some emphysema COPD.  Prior smoker.  And will patient will use albuterol inhaler 2 puffs every 6 hours for 7 days.   Final Clinical Impression(s) / ED Diagnoses Final diagnoses:  Chest pain  Flu-like symptoms  Bronchitis  Chest wall pain    Rx / DC Orders ED Discharge Orders          Ordered    azithromycin (ZITHROMAX) 250 MG tablet  Daily        07/17/21 2010              Fredia Sorrow, MD 07/17/21 2124

## 2021-07-17 NOTE — ED Triage Notes (Signed)
Pt c/o cough, congestion, headache, generalized body aches for three days. Denies fever, N/V/D. Pt endorses CP, worsening with coughing.

## 2021-07-17 NOTE — ED Notes (Signed)
EMT-P provided AVS using Teachback Method. Patient verbalizes understanding of Discharge Instructions. Opportunity for Questioning and Answers were provided by EMT-P. Patient Discharged from ED.  ? ?

## 2021-07-19 DIAGNOSIS — J439 Emphysema, unspecified: Secondary | ICD-10-CM | POA: Insufficient documentation

## 2021-07-25 NOTE — Progress Notes (Signed)
Erroneous encounter

## 2021-07-26 ENCOUNTER — Encounter: Payer: Self-pay | Admitting: Family

## 2021-07-29 NOTE — Progress Notes (Signed)
Patient ID: Jerry Steele, male    DOB: [birth year]  MRN: [id number]  CC: Cholesterol Follow-Up   Subjective: Jerry Steele is a 56 y.o. male who presents for cholesterol follow-up.   His concerns today include:   Hyperlipidemia follow-up: Doing well on current Atorvastatin, no issues.concerns.   2. Erectile dysfunction follow-up: Requesting refills, no issues/concerns.   Patient Active Problem List   Diagnosis Date Noted   COPD (chronic obstructive pulmonary disease) with emphysema ([place]) 07/19/2021   Hyperlipidemia 05/05/2021   Rectal bleeding 11/15/2020   Acute bronchitis 08/30/2018   Spell of dizziness 03/14/2018   Viral upper respiratory illness 03/14/2018   Cough variant asthma 11/27/2017   Cough 06/27/2017   Rib pain 06/27/2017     Current Outpatient Medications on File Prior to Visit  Medication Sig Dispense Refill   albuterol (VENTOLIN HFA) 108 (90 Base) MCG/ACT inhaler Inhale 1-2 puffs into the lungs every 6 (six) hours as needed for wheezing or shortness of breath. 18 g 0   atorvastatin (LIPITOR) 20 MG tablet Take 1 tablet (20 mg total) by mouth daily. 120 tablet 0   azithromycin (ZITHROMAX) 250 MG tablet Take 1 tablet (250 mg total) by mouth daily. Take first 2 tablets together, then 1 every day until finished. 6 tablet 0   budesonide-formoterol (SYMBICORT) 160-4.5 MCG/ACT inhaler Inhale 2 puffs into the lungs 2 (two) times daily. 1 each 5   gabapentin (NEURONTIN) 100 MG capsule Take 1 capsule (100 mg total) by mouth at bedtime. 30 capsule 1   hydrocortisone (ANUSOL-HC) 25 MG suppository Place 1 suppository (25 mg total) rectally 2 (two) times daily. 12 suppository 0   lidocaine (XYLOCAINE) 5 % ointment Apply 1 application topically as needed. 35.44 g 0   No current facility-administered medications on file prior to visit.    No Known Allergies  Social History   Socioeconomic History   Marital status: Single    Spouse name: Not on file    Number of children: Not on file   Years of education: Not on file   Highest education level: Not on file  Occupational History   Not on file  Tobacco Use   Smoking status: Former    Packs/day: 0.25    Years: 30.00    Pack years: 7.50    Types: Cigarettes    Quit date: 12/16/2016    Years since quitting: 4.6   Smokeless tobacco: Never  Vaping Use   Vaping Use: Never used  Substance and Sexual Activity   Alcohol use: No    Alcohol/week: 0.0 standard drinks   Drug use: No   Sexual activity: Yes  Other Topics Concern   Not on file  Social History Narrative   Not on file   Social Determinants of Health   Financial Resource Strain: Not on file  Food Insecurity: Not on file  Transportation Needs: Not on file  Physical Activity: Not on file  Stress: Not on file  Social Connections: Not on file  Intimate Partner Violence: Not on file    Family History  Problem Relation Age of Onset   Colon cancer Neg Hx    Colon polyps Neg Hx    Esophageal cancer Neg Hx    Rectal cancer Neg Hx    Stomach cancer Neg Hx     Past Surgical History:  Procedure Laterality Date   HEMORRHOID SURGERY     25 years ago    ROS: Review of Systems Negative except as stated above  PHYSICAL EXAM: BP 121/80 (BP Location: Left Arm, Patient Position: Sitting, Cuff Size: Large)    Pulse 66    Temp 98.3 F (36.8 C)    Resp 16    Ht 6' 1.5" (1.867 m)    Wt 190 lb (86.2 kg)    SpO2 94%    BMI 24.72 kg/m   Physical Exam HENT:     Head: Normocephalic and atraumatic.  Eyes:     Extraocular Movements: Extraocular movements intact.     Conjunctiva/sclera: Conjunctivae normal.     Pupils: Pupils are equal, round, and reactive to light.  Cardiovascular:     Rate and Rhythm: Normal rate and regular rhythm.     Pulses: Normal pulses.     Heart sounds: Normal heart sounds.  Pulmonary:     Effort: Pulmonary effort is normal.     Breath sounds: Normal breath sounds.  Musculoskeletal:     Cervical  back: Normal range of motion and neck supple.  Neurological:     General: No focal deficit present.     Mental Status: He is alert and oriented to person, place, and time.  Psychiatric:        Mood and Affect: Mood normal.        Behavior: Behavior normal.   ASSESSMENT AND PLAN: 1. Hyperlipidemia, unspecified hyperlipidemia type: - Lipid panel today.  - Will update medication regimen once lipid panel returns.  - Follow-up with primary provider as scheduled.  - Lipid panel  2. Erectile dysfunction, unspecified erectile dysfunction type: - Continue Sildenafil as prescribed.  - Follow-up with primary provider as scheduled.  - sildenafil (REVATIO) 20 MG tablet; Take 1 tablet 1/2 hour to 1 hour prior to intercourse as needed. Limit use to 1/2 tablet or 1 tablet per 24 hours.  Dispense: 30 tablet; Refill: 2    Patient was given the opportunity to ask questions.  Patient verbalized understanding of the plan and was able to repeat key elements of the plan. Patient was given clear instructions to go to Emergency Department or return to medical center if symptoms don't improve, worsen, or new problems develop.The patient verbalized understanding.   Orders Placed This Encounter  Procedures   Lipid panel    Requested Prescriptions   Signed Prescriptions Disp Refills   sildenafil (REVATIO) 20 MG tablet 30 tablet 2    Sig: Take 1 tablet 1/2 hour to 1 hour prior to intercourse as needed. Limit use to 1/2 tablet or 1 tablet per 24 hours.    Follow-up with primary provider as scheduled.   Camillia Herter, NP

## 2021-08-01 ENCOUNTER — Other Ambulatory Visit: Payer: Self-pay

## 2021-08-01 ENCOUNTER — Encounter (INDEPENDENT_AMBULATORY_CARE_PROVIDER_SITE_OTHER): Payer: Self-pay

## 2021-08-01 ENCOUNTER — Ambulatory Visit (INDEPENDENT_AMBULATORY_CARE_PROVIDER_SITE_OTHER): Payer: Self-pay | Admitting: Family

## 2021-08-01 VITALS — BP 121/80 | HR 66 | Temp 98.3°F | Resp 16 | Ht 73.5 in | Wt 190.0 lb

## 2021-08-01 DIAGNOSIS — N529 Male erectile dysfunction, unspecified: Secondary | ICD-10-CM

## 2021-08-01 DIAGNOSIS — E785 Hyperlipidemia, unspecified: Secondary | ICD-10-CM

## 2021-08-01 MED ORDER — SILDENAFIL CITRATE 20 MG PO TABS: ORAL_TABLET | ORAL | 2 refills | Status: DC

## 2021-08-01 NOTE — Progress Notes (Signed)
Pt presents for hyperlipidemia follow-up req refill on sildenafil

## 2021-08-02 ENCOUNTER — Other Ambulatory Visit: Payer: Self-pay | Admitting: Family

## 2021-08-02 DIAGNOSIS — E785 Hyperlipidemia, unspecified: Secondary | ICD-10-CM

## 2021-08-02 LAB — LIPID PANEL
Chol/HDL Ratio: 5.1 ratio — ABNORMAL HIGH (ref 0.0–5.0)
Cholesterol, Total: 187 mg/dL (ref 100–199)
HDL: 37 mg/dL — ABNORMAL LOW (ref >39)
LDL Chol Calc (NIH): 112 mg/dL — ABNORMAL HIGH (ref 0–99)
Triglycerides: 218 mg/dL — ABNORMAL HIGH (ref 0–149)
VLDL Cholesterol Cal: 38 mg/dL (ref 5–40)

## 2021-08-02 MED ORDER — ATORVASTATIN CALCIUM 40 MG PO TABS: 40.0000 mg | ORAL_TABLET | Freq: Every day | ORAL | 0 refills | Status: DC

## 2021-08-02 NOTE — Progress Notes (Signed)
Please call patient with update.   Increase Atorvastatin to 40 mg daily. Recheck fasting cholesterol in 4 to 6 weeks.   The following is for provider reference only:  The 10-year ASCVD risk score (Arnett DK, et al., 2019) is: 7.2%   Values used to calculate the score:     Age: 56 years     Sex: Male     Is Non-Hispanic African American: No     Diabetic: No     Tobacco smoker: No     Systolic Blood Pressure: 784 mmHg     Is BP treated: No     HDL Cholesterol: 37 mg/dL     Total Cholesterol: 187 mg/dL

## 2021-09-06 NOTE — Progress Notes (Signed)
Patient ID: Jerry Steele, male    DOB: [birth year]  MRN: [id number]  CC: Urinary Symptoms   Subjective: Jerry Steele is a 56 y.o. male who presents for urinary symptoms.   His concerns today include:  URINARY SYMPTOMS: Has urge to urinate. Then when goes to restroom urine stream slow to start having to push sometimes to start stream. Denies pain during urination, no blood in urine. Intermittent bilateral lower stomach pain. Denies nausea and vomiting. Has intermittent scrotal pain with touch only. Denies swelling of the same. He is sexually active with one partner, does not use protection. Concern for prostate issues.    Patient Active Problem List   Diagnosis Date Noted   COPD (chronic obstructive pulmonary disease) with emphysema ([place]) 07/19/2021   Hyperlipidemia 05/05/2021   Rectal bleeding 11/15/2020   Acute bronchitis 08/30/2018   Spell of dizziness 03/14/2018   Viral upper respiratory illness 03/14/2018   Cough variant asthma 11/27/2017   Cough 06/27/2017   Rib pain 06/27/2017     Current Outpatient Medications on File Prior to Visit  Medication Sig Dispense Refill   albuterol (VENTOLIN HFA) 108 (90 Base) MCG/ACT inhaler Inhale 1-2 puffs into the lungs every 6 (six) hours as needed for wheezing or shortness of breath. 18 g 0   atorvastatin (LIPITOR) 40 MG tablet Take 1 tablet (40 mg total) by mouth daily. 120 tablet 0   azithromycin (ZITHROMAX) 250 MG tablet Take 1 tablet (250 mg total) by mouth daily. Take first 2 tablets together, then 1 every day until finished. 6 tablet 0   budesonide-formoterol (SYMBICORT) 160-4.5 MCG/ACT inhaler Inhale 2 puffs into the lungs 2 (two) times daily. 1 each 5   gabapentin (NEURONTIN) 100 MG capsule Take 1 capsule (100 mg total) by mouth at bedtime. 30 capsule 1   hydrocortisone (ANUSOL-HC) 25 MG suppository Place 1 suppository (25 mg total) rectally 2 (two) times daily. 12 suppository 0   lidocaine (XYLOCAINE) 5 %  ointment Apply 1 application topically as needed. 35.44 g 0   sildenafil (REVATIO) 20 MG tablet Take 1 tablet 1/2 hour to 1 hour prior to intercourse as needed. Limit use to 1/2 tablet or 1 tablet per 24 hours. 30 tablet 2   No current facility-administered medications on file prior to visit.    No Known Allergies  Social History   Socioeconomic History   Marital status: Single    Spouse name: Not on file   Number of children: Not on file   Years of education: Not on file   Highest education level: Not on file  Occupational History   Not on file  Tobacco Use   Smoking status: Former    Packs/day: 0.25    Years: 30.00    Pack years: 7.50    Types: Cigarettes    Quit date: 12/16/2016    Years since quitting: 4.7   Smokeless tobacco: Never  Vaping Use   Vaping Use: Never used  Substance and Sexual Activity   Alcohol use: No    Alcohol/week: 0.0 standard drinks   Drug use: No   Sexual activity: Yes  Other Topics Concern   Not on file  Social History Narrative   Not on file   Social Determinants of Health   Financial Resource Strain: Not on file  Food Insecurity: Not on file  Transportation Needs: Not on file  Physical Activity: Not on file  Stress: Not on file  Social Connections: Not on file  Intimate Partner Violence: Not  on file    Family History  Problem Relation Age of Onset   Colon cancer Neg Hx    Colon polyps Neg Hx    Esophageal cancer Neg Hx    Rectal cancer Neg Hx    Stomach cancer Neg Hx     Past Surgical History:  Procedure Laterality Date   HEMORRHOID SURGERY     25 years ago    ROS: Review of Systems Negative except as stated above  PHYSICAL EXAM: BP 111/69 (BP Location: Left Arm, Patient Position: Sitting, Cuff Size: Large)    Pulse 79    Temp 98.5 F (36.9 C)    Resp 18    Ht 6' 1.5" (1.867 m)    Wt 186 lb (84.4 kg)    SpO2 96%    BMI 24.20 kg/m   Physical Exam HENT:     Head: Normocephalic and atraumatic.  Eyes:     Extraocular  Movements: Extraocular movements intact.     Conjunctiva/sclera: Conjunctivae normal.     Pupils: Pupils are equal, round, and reactive to light.  Cardiovascular:     Rate and Rhythm: Normal rate and regular rhythm.     Pulses: Normal pulses.     Heart sounds: Normal heart sounds.  Pulmonary:     Effort: Pulmonary effort is normal.     Breath sounds: Normal breath sounds.  Abdominal:     General: Bowel sounds are normal.     Palpations: Abdomen is soft.  Musculoskeletal:     Cervical back: Normal range of motion and neck supple.  Neurological:     General: No focal deficit present.     Mental Status: He is alert and oriented to person, place, and time.  Psychiatric:        Mood and Affect: Mood normal.        Behavior: Behavior normal.   Results for orders placed or performed in visit on 09/07/21  POCT URINALYSIS DIP (CLINITEK)  Result Value Ref Range   Color, UA yellow yellow   Clarity, UA clear clear   Glucose, UA negative negative mg/dL   Bilirubin, UA negative negative   Ketones, POC UA negative negative mg/dL   Spec Grav, UA 1.025 1.010 - 1.025   Blood, UA trace-intact (A) negative   pH, UA 5.0 5.0 - 8.0   POC PROTEIN,UA negative negative, trace   Urobilinogen, UA 0.2 0.2 or 1.0 E.U./dL   Nitrite, UA Negative Negative   Leukocytes, UA Negative Negative    ASSESSMENT AND PLAN: 1. Lower urinary tract symptoms (LUTS): - No urinary tract infection. - Screening urine for sexually transmitted infections, pending.  - CMP14+EGFR to check kidney function, liver function, and electrolyte balance.  - CBC to screen for anemia. - POCT URINALYSIS DIP (CLINITEK) - Urine cytology ancillary only - CMP14+EGFR - CBC  2. Prostate cancer screening: 3. Decreased urine stream: - PSA to screen prostate function.  - Differential diagnosis include benign prostatic hyperplasia.  - PSA   Patient was given the opportunity to ask questions.  Patient verbalized understanding of the  plan and was able to repeat key elements of the plan. Patient was given clear instructions to go to Emergency Department or return to medical center if symptoms don't improve, worsen, or new problems develop.The patient verbalized understanding.   Orders Placed This Encounter  Procedures   PSA   CMP14+EGFR   CBC   POCT URINALYSIS DIP (CLINITEK)    Follow-up with primary provider as scheduled.  Camillia Herter, NP

## 2021-09-07 ENCOUNTER — Other Ambulatory Visit: Payer: Self-pay

## 2021-09-07 ENCOUNTER — Encounter (INDEPENDENT_AMBULATORY_CARE_PROVIDER_SITE_OTHER): Payer: Self-pay

## 2021-09-07 ENCOUNTER — Ambulatory Visit (INDEPENDENT_AMBULATORY_CARE_PROVIDER_SITE_OTHER): Payer: Self-pay | Admitting: Family

## 2021-09-07 ENCOUNTER — Other Ambulatory Visit (HOSPITAL_COMMUNITY)
Admission: RE | Admit: 2021-09-07 | Discharge: 2021-09-07 | Disposition: A | Discharge status: Home / Self Care | Payer: Self-pay | Source: Ambulatory Visit | Attending: Family | Admitting: Family

## 2021-09-07 VITALS — BP 111/69 | HR 79 | Temp 98.5°F | Resp 18 | Ht 73.5 in | Wt 186.0 lb

## 2021-09-07 DIAGNOSIS — Z125 Encounter for screening for malignant neoplasm of prostate: Secondary | ICD-10-CM

## 2021-09-07 DIAGNOSIS — R39198 Other difficulties with micturition: Secondary | ICD-10-CM

## 2021-09-07 DIAGNOSIS — R399 Unspecified symptoms and signs involving the genitourinary system: Secondary | ICD-10-CM

## 2021-09-07 LAB — POCT URINALYSIS DIP (CLINITEK)
Bilirubin, UA: NEGATIVE
Glucose, UA: NEGATIVE mg/dL
Ketones, POC UA: NEGATIVE mg/dL
Leukocytes, UA: NEGATIVE
Nitrite, UA: NEGATIVE
POC PROTEIN,UA: NEGATIVE
Spec Grav, UA: 1.025 (ref 1.010–1.025)
Urobilinogen, UA: 0.2 E.U./dL
pH, UA: 5 (ref 5.0–8.0)

## 2021-09-07 NOTE — Progress Notes (Signed)
Please call patient with update.   No urinary tract infection.

## 2021-09-07 NOTE — Progress Notes (Signed)
Pt presents for abdominal pain, and urinary retention denies any pain during urination

## 2021-09-08 ENCOUNTER — Other Ambulatory Visit: Payer: Self-pay | Admitting: Family

## 2021-09-08 DIAGNOSIS — R39198 Other difficulties with micturition: Secondary | ICD-10-CM

## 2021-09-08 DIAGNOSIS — R399 Unspecified symptoms and signs involving the genitourinary system: Secondary | ICD-10-CM

## 2021-09-08 LAB — CMP14+EGFR
ALT: 21 IU/L (ref 0–44)
AST: 21 IU/L (ref 0–40)
Albumin/Globulin Ratio: 1.4 (ref 1.2–2.2)
Albumin: 4.5 g/dL (ref 3.8–4.9)
Alkaline Phosphatase: 85 IU/L (ref 44–121)
BUN/Creatinine Ratio: 16 (ref 9–20)
BUN: 14 mg/dL (ref 6–24)
Bilirubin Total: 0.3 mg/dL (ref 0.0–1.2)
CO2: 26 mmol/L (ref 20–29)
Calcium: 9.4 mg/dL (ref 8.7–10.2)
Chloride: 102 mmol/L (ref 96–106)
Creatinine, Ser: 0.89 mg/dL (ref 0.76–1.27)
Globulin, Total: 3.3 g/dL (ref 1.5–4.5)
Glucose: 78 mg/dL (ref 70–99)
Potassium: 4.1 mmol/L (ref 3.5–5.2)
Sodium: 143 mmol/L (ref 134–144)
Total Protein: 7.8 g/dL (ref 6.0–8.5)
eGFR: 100 mL/min/{1.73_m2} (ref >59)

## 2021-09-08 LAB — URINE CYTOLOGY ANCILLARY ONLY
Chlamydia: NEGATIVE
Comment: NEGATIVE
Comment: NEGATIVE
Comment: NORMAL
Neisseria Gonorrhea: NEGATIVE
Trichomonas: NEGATIVE

## 2021-09-08 LAB — CBC
Hematocrit: 43.7 % (ref 37.5–51.0)
Hemoglobin: 14.5 g/dL (ref 13.0–17.7)
MCH: 29.7 pg (ref 26.6–33.0)
MCHC: 33.2 g/dL (ref 31.5–35.7)
MCV: 90 fL (ref 79–97)
Platelets: 183 10*3/uL (ref 150–450)
RBC: 4.88 x10E6/uL (ref 4.14–5.80)
RDW: 13 % (ref 11.6–15.4)
WBC: 9.3 10*3/uL (ref 3.4–10.8)

## 2021-09-08 LAB — PSA: Prostate Specific Ag, Serum: 0.7 ng/mL (ref 0.0–4.0)

## 2021-09-08 NOTE — Progress Notes (Signed)
Please call patient with update.   Prostate function normal.  Kidney function normal.  Liver function normal.  No anemia.   Gonorrhea, Chlamydia, Trichomonas negative  Referral to Urology for further evaluation and management. Their office should call patient within 2 weeks with appointment details.

## 2021-09-13 ENCOUNTER — Encounter: Payer: Self-pay | Admitting: *Deleted

## 2021-09-30 ENCOUNTER — Ambulatory Visit: Payer: Self-pay | Admitting: Urology

## 2021-09-30 NOTE — Progress Notes (Signed)
Patient plans to follow-up with established Pulmonology for today's concerns. ?

## 2021-10-05 ENCOUNTER — Encounter: Payer: Self-pay | Admitting: Family

## 2021-10-19 ENCOUNTER — Other Ambulatory Visit: Payer: Self-pay | Admitting: Family

## 2021-10-19 DIAGNOSIS — N529 Male erectile dysfunction, unspecified: Secondary | ICD-10-CM

## 2021-10-19 NOTE — Telephone Encounter (Signed)
Copied from [place] [id number]. Topic: Quick Communication - Rx Refill/Question ?>> Oct 19, 2021 10:22 AM Yvette Rack wrote: ?Medication: sildenafil (REVATIO) 20 MG tablet  ? ?Has the patient contacted their pharmacy? Yes.   ?(Agent: If no, request that the patient contact the pharmacy for the refill. If patient does not wish to contact the pharmacy document the reason why and proceed with request.) ?(Agent: If yes, when and what did the pharmacy advise?) ? ?Preferred Pharmacy (with phone number or street name): [facility], [place].  ?Phone: [phone] ?Fax: [phone] ? ?Has the patient been seen for an appointment in the last year OR does the patient have an upcoming appointment? Yes.   ? ?Agent: Please be advised that RX refills may take up to 3 business days. We ask that you follow-up with your pharmacy. ?

## 2021-10-20 MED ORDER — SILDENAFIL CITRATE 20 MG PO TABS: ORAL_TABLET | ORAL | 2 refills | Status: DC

## 2021-10-20 NOTE — Telephone Encounter (Signed)
Requested Prescriptions  ?Pending Prescriptions Disp Refills  ?? sildenafil (REVATIO) 20 MG tablet 30 tablet 2  ?  Sig: Take 1 tablet 1/2 hour to 1 hour prior to intercourse as needed. Limit use to 1/2 tablet or 1 tablet per 24 hours.  ?  ? Urology: Erectile Dysfunction Agents Passed - 10/19/2021 12:23 PM  ?  ?  Passed - AST in normal range and within 360 days  ?  AST  ?Date Value Ref Range Status  ?09/07/2021 21 0 - 40 IU/L Final  ?   ?  ?  Passed - ALT in normal range and within 360 days  ?  ALT  ?Date Value Ref Range Status  ?09/07/2021 21 0 - 44 IU/L Final  ?   ?  ?  Passed - Last BP in normal range  ?  BP Readings from Last 1 Encounters:  ?09/07/21 111/69  ?   ?  ?  Passed - Valid encounter within last 12 months  ?  Recent Outpatient Visits   ?      ? 1 month ago Lower urinary tract symptoms (LUTS)  ? Primary Care at [facility], Amy J, NP  ? 2 months ago Hyperlipidemia, unspecified hyperlipidemia type  ? Primary Care at [facility], Amy J, NP  ? 5 months ago Chronic obstructive pulmonary disease, unspecified COPD type ([place])  ? Primary Care at [facility], Amy J, NP  ? 5 months ago Chronic obstructive pulmonary disease, unspecified COPD type ([place])  ? Primary Care at [facility], Amy J, NP  ? 6 months ago Erectile dysfunction, unspecified erectile dysfunction type  ? Primary Care at [facility], Kriste Basque, NP  ?  ?  ? ?  ?  ?  ? ? ?

## 2021-11-15 ENCOUNTER — Ambulatory Visit: Payer: Self-pay | Admitting: Nurse Practitioner

## 2021-11-16 ENCOUNTER — Ambulatory Visit: Payer: Self-pay | Admitting: Nurse Practitioner

## 2021-11-18 NOTE — Progress Notes (Signed)
Erroneous encounter

## 2021-11-22 ENCOUNTER — Encounter: Payer: Self-pay | Admitting: Family

## 2021-12-26 ENCOUNTER — Other Ambulatory Visit: Payer: Self-pay | Admitting: Family

## 2021-12-26 DIAGNOSIS — N529 Male erectile dysfunction, unspecified: Secondary | ICD-10-CM

## 2021-12-26 NOTE — Telephone Encounter (Signed)
Refilled per patient request. 

## 2022-02-08 ENCOUNTER — Other Ambulatory Visit: Payer: Self-pay | Admitting: Family

## 2022-02-08 DIAGNOSIS — N529 Male erectile dysfunction, unspecified: Secondary | ICD-10-CM

## 2022-02-09 NOTE — Telephone Encounter (Signed)
Requested Prescriptions  Pending Prescriptions Disp Refills  . sildenafil (REVATIO) 20 MG tablet [Pharmacy Med Name: Sildenafil Citrate 20 MG Oral Tablet] 90 tablet 0    Sig: TAKE 1 TABLET BY MOUTH 30-60 MINUTES PRIOR TO INTERCOURSE AS NEEDED. NO MORE THAN 1 TABLET IN 24 HOURS     Urology: Erectile Dysfunction Agents Passed - 02/08/2022  3:54 PM      Passed - AST in normal range and within 360 days    AST  Date Value Ref Range Status  09/07/2021 21 0 - 40 IU/L Final         Passed - ALT in normal range and within 360 days    ALT  Date Value Ref Range Status  09/07/2021 21 0 - 44 IU/L Final         Passed - Last BP in normal range    BP Readings from Last 1 Encounters:  09/07/21 111/69         Passed - Valid encounter within last 12 months    Recent Outpatient Visits          5 months ago Lower urinary tract symptoms (LUTS)   Primary Care at [facility], Amy J, NP   6 months ago Hyperlipidemia, unspecified hyperlipidemia type   Primary Care at [facility], Amy J, NP   8 months ago Chronic obstructive pulmonary disease, unspecified COPD type [facility])   Primary Care at [facility], Amy J, NP   9 months ago Chronic obstructive pulmonary disease, unspecified COPD type [facility])   Primary Care at [facility], Amy J, NP   10 months ago Erectile dysfunction, unspecified erectile dysfunction type   Primary Care at [facility], Kriste Basque, NP

## 2022-03-17 IMAGING — DX DG CHEST 1V PORT
1 series · 1 of 1 positions shown · non-contrast
Comparison: August 08, 2018

CLINICAL DATA: Chest pain

EXAM:
PORTABLE CHEST 1 VIEW

[chest]
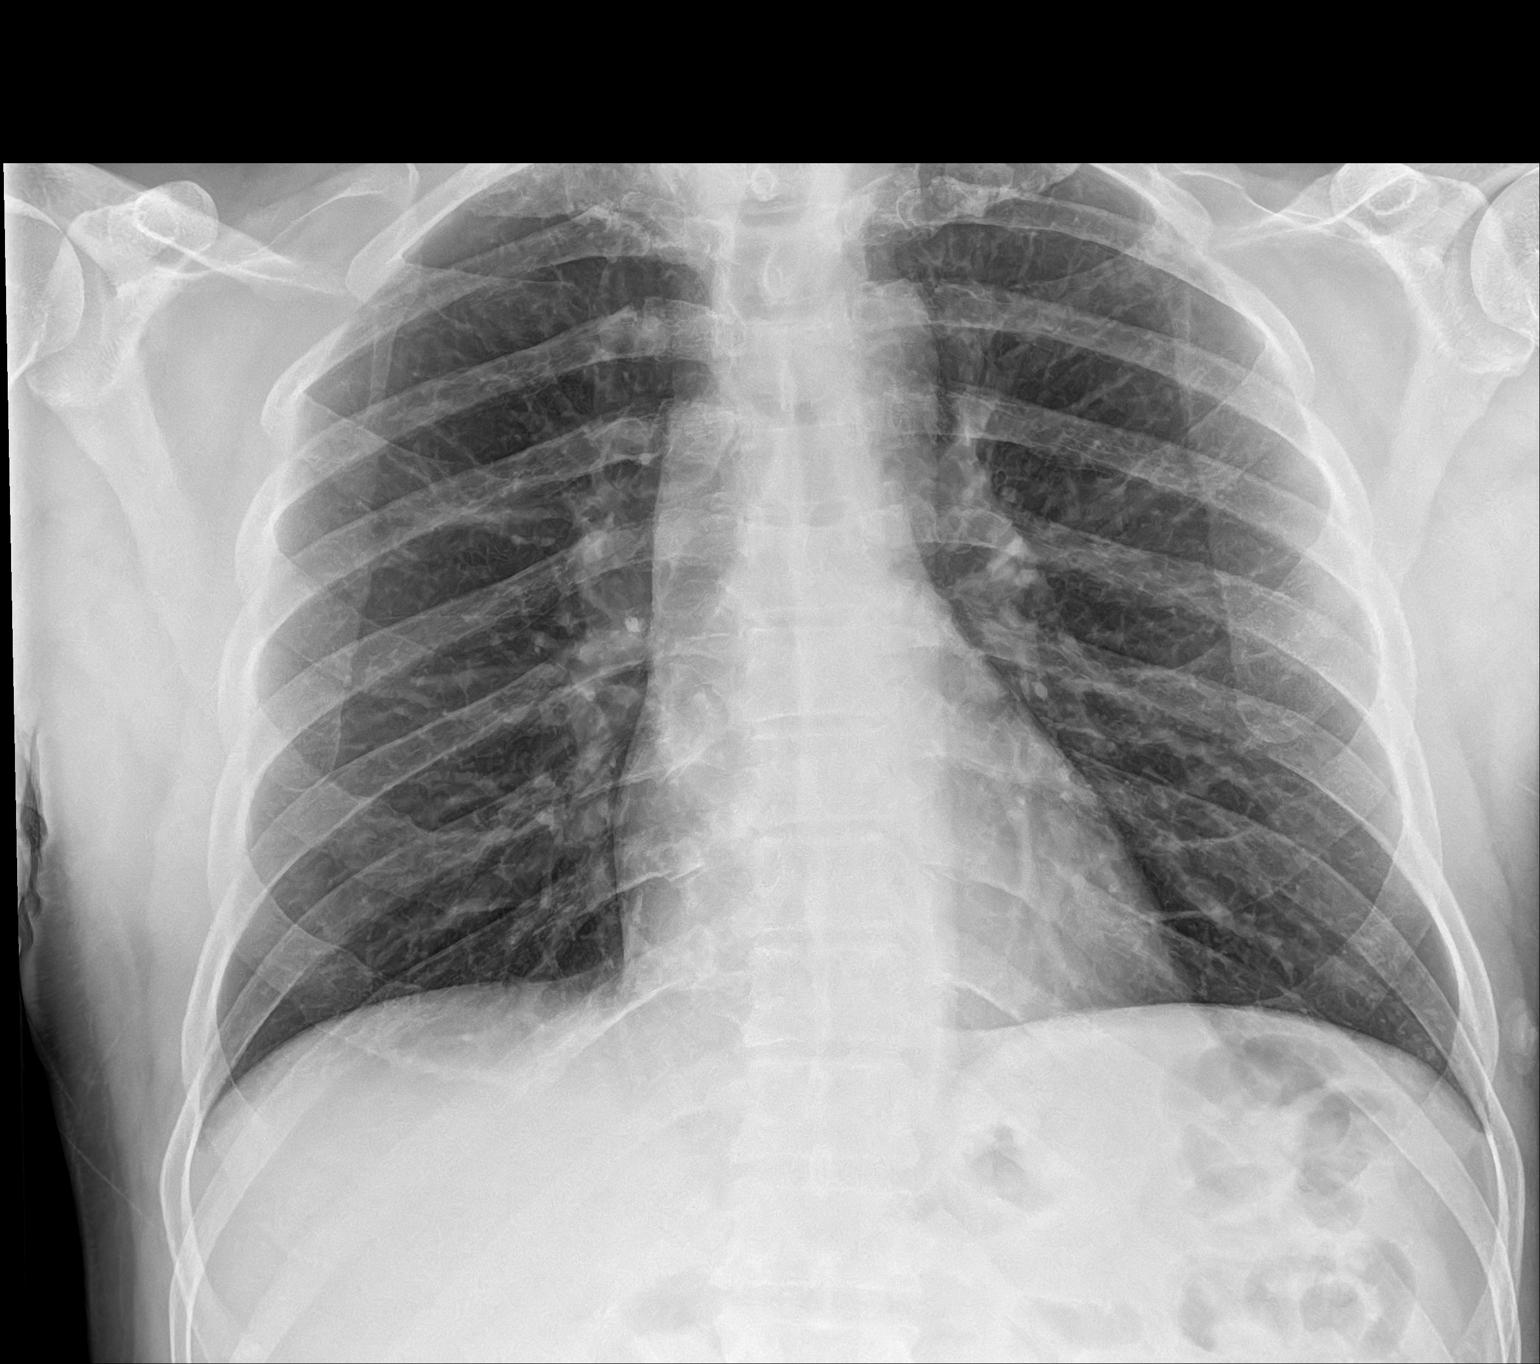

[1 of 1 positions shown; findings below may reference images not displayed]

FINDINGS: The heart size and mediastinal contours are within normal limits.
Both lungs are clear. The visualized skeletal structures are
unremarkable.
IMPRESSION: No active disease.

## 2022-03-28 ENCOUNTER — Other Ambulatory Visit: Payer: Self-pay | Admitting: Family

## 2022-03-28 DIAGNOSIS — N529 Male erectile dysfunction, unspecified: Secondary | ICD-10-CM

## 2022-03-28 NOTE — Telephone Encounter (Signed)
Medication Refill - Medication: sildenafil (REVATIO) 20 MG tablet   Has the patient contacted their pharmacy? Yes.   (Agent: If no, request that the patient contact the pharmacy for the refill. If patient does not wish to contact the pharmacy document the reason why and proceed with request.) (Agent: If yes, when and what did the pharmacy advise?)  Preferred Pharmacy (with phone number or street name):  [place], [address]  [place] [place] [address]  Phone: [phone] Fax: [phone]   Has the patient been seen for an appointment in the last year OR does the patient have an upcoming appointment? Yes.    Agent: Please be advised that RX refills may take up to 3 business days. We ask that you follow-up with your pharmacy.

## 2022-03-29 NOTE — Telephone Encounter (Signed)
Requested Prescriptions  Pending Prescriptions Disp Refills  . sildenafil (REVATIO) 20 MG tablet 90 tablet 0    Sig: TAKE 1 TABLET BY MOUTH 30-60 MINUTES PRIOR TO INTERCOURSE AS NEEDED. NO MORE THAN 1 TABLET IN 24 HOURS     Urology: Erectile Dysfunction Agents Passed - 03/28/2022  5:35 PM      Passed - AST in normal range and within 360 days    AST  Date Value Ref Range Status  09/07/2021 21 0 - 40 IU/L Final         Passed - ALT in normal range and within 360 days    ALT  Date Value Ref Range Status  09/07/2021 21 0 - 44 IU/L Final         Passed - Last BP in normal range    BP Readings from Last 1 Encounters:  09/07/21 111/69         Passed - Valid encounter within last 12 months    Recent Outpatient Visits          6 months ago Lower urinary tract symptoms (LUTS)   Primary Care at [facility], Amy J, NP   8 months ago Hyperlipidemia, unspecified hyperlipidemia type   Primary Care at [facility], Amy J, NP   10 months ago Chronic obstructive pulmonary disease, unspecified COPD type [facility])   Primary Care at [facility], Amy J, NP   10 months ago Chronic obstructive pulmonary disease, unspecified COPD type [facility])   Primary Care at [facility], Amy J, NP   11 months ago Erectile dysfunction, unspecified erectile dysfunction type   Primary Care at [facility], Kriste Basque, NP      Future Appointments            In 2 weeks Camillia Herter, NP Primary Care at [facility]

## 2022-03-29 NOTE — Telephone Encounter (Signed)
Pharmacy called to clarify if pt knew of the rx just written 02/09/22 for #90 since he is already requesting refill. Pharmacy states he filled it and picked it up 02/16/22. Will refuse for requesting too early.

## 2022-03-30 ENCOUNTER — Other Ambulatory Visit: Payer: Self-pay | Admitting: Family

## 2022-03-30 DIAGNOSIS — N529 Male erectile dysfunction, unspecified: Secondary | ICD-10-CM

## 2022-03-30 NOTE — Progress Notes (Signed)
Patient ID: Jerry Steele, male    DOB: 1966-02-20  MRN: [id number]  CC: Follow-Up  Subjective: Jerry Steele is a 56 y.o. male who presents for follow-up.   His concerns today include:  - Established with Cardiology. Last appointment 04/11/2022. He is aware left arm discomfort related to chest pain syndrome. However, considered if he has arthritis of left arm because of frequent overuse. I offered him a referral to Orthopedics and he declined. Discussed importance of keeping all scheduled appointments with Cardiology.  - Established with Pulmonology. Last appointment 04/12/2022. Discussed importance of keeping all scheduled appointments with Pulmonology.  - Reports history of inguinal hernia. States discovered incidentally several years ago during removal of polyps from his intestine. Reports at that time recommended to watchful wait hernia because it was too small for repair. Recently having more discomfort of inguinal hernia especially with excessive coughing due to his chronic respiratory conditions.  - Reports taking 3 tablets of Sildenafil to help with erectile dysfunction. States the prescribed dose doesn't help.   Patient Active Problem List   Diagnosis Date Noted   COPD (chronic obstructive pulmonary disease) with emphysema ([place]) 07/19/2021   Hyperlipidemia 05/05/2021   Rectal bleeding 11/15/2020   Acute bronchitis 08/30/2018   Spell of dizziness 03/14/2018   Viral upper respiratory illness 03/14/2018   Cough variant asthma 11/27/2017   Cough 06/27/2017   Rib pain 06/27/2017     Current Outpatient Medications on File Prior to Visit  Medication Sig Dispense Refill   albuterol (VENTOLIN HFA) 108 (90 Base) MCG/ACT inhaler Inhale 1-2 puffs into the lungs every 6 (six) hours as needed for wheezing or shortness of breath. 18 g 0   atorvastatin (LIPITOR) 40 MG tablet Take 1 tablet (40 mg total) by mouth daily. 120 tablet 0   budesonide-formoterol (SYMBICORT)  160-4.5 MCG/ACT inhaler Inhale 2 puffs into the lungs 2 (two) times daily. 1 each 5   gabapentin (NEURONTIN) 100 MG capsule Take 1 capsule (100 mg total) by mouth at bedtime. (Patient not taking: Reported on 04/13/2022) 30 capsule 1   hydrocortisone (ANUSOL-HC) 25 MG suppository Place 1 suppository (25 mg total) rectally 2 (two) times daily. (Patient not taking: Reported on 04/13/2022) 12 suppository 0   lidocaine (XYLOCAINE) 5 % ointment Apply 1 application topically as needed. (Patient not taking: Reported on 04/13/2022) 35.44 g 0   No current facility-administered medications on file prior to visit.    No Known Allergies  Social History   Socioeconomic History   Marital status: Single    Spouse name: Not on file   Number of children: Not on file   Years of education: Not on file   Highest education level: Not on file  Occupational History   Not on file  Tobacco Use   Smoking status: Former    Packs/day: 0.25    Years: 30.00    Total pack years: 7.50    Types: Cigarettes    Quit date: 12/16/2016    Years since quitting: 5.3   Smokeless tobacco: Never  Vaping Use   Vaping Use: Never used  Substance and Sexual Activity   Alcohol use: No    Alcohol/week: 0.0 standard drinks of alcohol   Drug use: No   Sexual activity: Yes  Other Topics Concern   Not on file  Social History Narrative   Not on file   Social Determinants of Health   Financial Resource Strain: Not on file  Food Insecurity: Not on file  Transportation Needs:  Not on file  Physical Activity: Not on file  Stress: Not on file  Social Connections: Not on file  Intimate Partner Violence: Not on file    Family History  Problem Relation Age of Onset   Colon cancer Neg Hx    Colon polyps Neg Hx    Esophageal cancer Neg Hx    Rectal cancer Neg Hx    Stomach cancer Neg Hx     Past Surgical History:  Procedure Laterality Date   HEMORRHOID SURGERY     25 years ago    ROS: Review of Systems Negative except  as stated above  PHYSICAL EXAM: BP 120/74 (BP Location: Left Arm, Patient Position: Sitting, Cuff Size: Normal)   Pulse 81   Temp 98 F (36.7 C) (Oral)   Resp 16   Ht '6\' 3"'$  (1.905 m)   Wt 194 lb (88 kg)   SpO2 95%   BMI 24.25 kg/m   Physical Exam HENT:     Head: Normocephalic and atraumatic.  Eyes:     Extraocular Movements: Extraocular movements intact.     Conjunctiva/sclera: Conjunctivae normal.     Pupils: Pupils are equal, round, and reactive to light.  Cardiovascular:     Rate and Rhythm: Normal rate and regular rhythm.     Pulses: Normal pulses.     Heart sounds: Normal heart sounds.  Pulmonary:     Effort: Pulmonary effort is normal.     Breath sounds: Normal breath sounds.  Musculoskeletal:     Cervical back: Normal range of motion and neck supple.  Neurological:     General: No focal deficit present.     Mental Status: He is alert and oriented to person, place, and time.  Psychiatric:        Mood and Affect: Mood normal.        Behavior: Behavior normal.      ASSESSMENT AND PLAN: 1. Inguinal hernia without obstruction or gangrene, recurrence not specified, unspecified laterality - Referral to General Surgery for further evaluation and management.  - Ambulatory referral to General Surgery  2. Erectile dysfunction, unspecified erectile dysfunction type - Continue Sildenafil as prescribed.  - Referral to Urology for further evaluation and management.  - sildenafil (REVATIO) 20 MG tablet; TAKE 1 TABLET BY MOUTH 30-60 MINUTES PRIOR TO INTERCOURSE AS NEEDED. NO MORE THAN 1 TABLET  IN 24 HOURS.  Dispense: 30 tablet; Refill: 2 - Ambulatory referral to Urology  3. Hyperlipidemia, unspecified hyperlipidemia type - Update lipid panel.  - Lipid Panel  4. Diabetes mellitus screening - Routine screening.  - Hemoglobin A1c  5. Thyroid disorder screen - Routine screening.  - TSH  6. Need for immunization against influenza - Administered.  - Flu Vaccine QUAD  84moIM (Fluarix, Fluzone & Alfiuria Quad PF)    Patient was given the opportunity to ask questions.  Patient verbalized understanding of the plan and was able to repeat key elements of the plan. Patient was given clear instructions to go to Emergency Department or return to medical center if symptoms don't improve, worsen, or new problems develop.The patient verbalized understanding.   Orders Placed This Encounter  Procedures   Flu Vaccine QUAD 642moM (Fluarix, Fluzone & Alfiuria Quad PF)   TSH   Lipid Panel   Hemoglobin A1c   Ambulatory referral to Urology   Ambulatory referral to General Surgery     Requested Prescriptions   Signed Prescriptions Disp Refills   sildenafil (REVATIO) 20 MG tablet 30 tablet 2  Sig: TAKE 1 TABLET BY MOUTH 30-60 MINUTES PRIOR TO INTERCOURSE AS NEEDED. NO MORE THAN 1 TABLET  IN 24 HOURS.    Follow-up with primary provider as scheduled.   Camillia Herter, NP

## 2022-03-31 ENCOUNTER — Other Ambulatory Visit: Payer: Self-pay | Admitting: Family

## 2022-03-31 DIAGNOSIS — N529 Male erectile dysfunction, unspecified: Secondary | ICD-10-CM

## 2022-04-12 DIAGNOSIS — R0609 Other forms of dyspnea: Secondary | ICD-10-CM | POA: Insufficient documentation

## 2022-04-13 ENCOUNTER — Ambulatory Visit (INDEPENDENT_AMBULATORY_CARE_PROVIDER_SITE_OTHER): Payer: Self-pay | Admitting: Family

## 2022-04-13 VITALS — BP 120/74 | HR 81 | Temp 98.0°F | Resp 16 | Ht 75.0 in | Wt 194.0 lb

## 2022-04-13 DIAGNOSIS — K4090 Unilateral inguinal hernia, without obstruction or gangrene, not specified as recurrent: Secondary | ICD-10-CM

## 2022-04-13 DIAGNOSIS — Z1329 Encounter for screening for other suspected endocrine disorder: Secondary | ICD-10-CM

## 2022-04-13 DIAGNOSIS — E785 Hyperlipidemia, unspecified: Secondary | ICD-10-CM

## 2022-04-13 DIAGNOSIS — Z131 Encounter for screening for diabetes mellitus: Secondary | ICD-10-CM

## 2022-04-13 DIAGNOSIS — N529 Male erectile dysfunction, unspecified: Secondary | ICD-10-CM

## 2022-04-13 DIAGNOSIS — Z23 Encounter for immunization: Secondary | ICD-10-CM

## 2022-04-13 MED ORDER — SILDENAFIL CITRATE 20 MG PO TABS: ORAL_TABLET | ORAL | 2 refills | Status: DC

## 2022-04-13 NOTE — Progress Notes (Signed)
Follow up for pain in groin and pelvic Pain greater that 1 month, cramping, intermittently Feels like it may come from hernia he had in the past.   Discuss concerns with ED and medication   Left arm discomfort

## 2022-04-14 ENCOUNTER — Other Ambulatory Visit: Payer: Self-pay | Admitting: Family

## 2022-04-14 ENCOUNTER — Encounter: Payer: Self-pay | Admitting: Family

## 2022-04-14 DIAGNOSIS — R7303 Prediabetes: Secondary | ICD-10-CM | POA: Insufficient documentation

## 2022-04-14 DIAGNOSIS — E785 Hyperlipidemia, unspecified: Secondary | ICD-10-CM

## 2022-04-14 LAB — LIPID PANEL
Chol/HDL Ratio: 3.1 ratio (ref 0.0–5.0)
Cholesterol, Total: 148 mg/dL (ref 100–199)
HDL: 47 mg/dL (ref >39)
LDL Chol Calc (NIH): 81 mg/dL (ref 0–99)
Triglycerides: 111 mg/dL (ref 0–149)
VLDL Cholesterol Cal: 20 mg/dL (ref 5–40)

## 2022-04-14 LAB — TSH: TSH: 3.04 u[IU]/mL (ref 0.450–4.500)

## 2022-04-14 LAB — HEMOGLOBIN A1C
Est. average glucose Bld gHb Est-mCnc: 123 mg/dL
Hgb A1c MFr Bld: 5.9 % — ABNORMAL HIGH (ref 4.8–5.6)

## 2022-04-14 MED ORDER — ATORVASTATIN CALCIUM 40 MG PO TABS: 40.0000 mg | ORAL_TABLET | Freq: Every day | ORAL | 2 refills | Status: DC

## 2022-04-19 NOTE — Progress Notes (Deleted)
Patient ID: Jerry Steele, male   DOB: 05-Jul-1966, 56 y.o.   MRN: [id number]  Chief Complaint:  ***  History of Present Illness Jerry Steele is a 56 y.o. male with ***.  Past Medical History Past Medical History:  Diagnosis Date   Arthritis    hand   Emphysema lung ([facility])    GERD (gastroesophageal reflux disease)    Pneumonia       Past Surgical History:  Procedure Laterality Date   HEMORRHOID SURGERY     25 years ago    No Known Allergies  Current Outpatient Medications  Medication Sig Dispense Refill   albuterol (VENTOLIN HFA) 108 (90 Base) MCG/ACT inhaler Inhale 1-2 puffs into the lungs every 6 (six) hours as needed for wheezing or shortness of breath. 18 g 0   atorvastatin (LIPITOR) 40 MG tablet Take 1 tablet (40 mg total) by mouth daily. 30 tablet 2   budesonide-formoterol (SYMBICORT) 160-4.5 MCG/ACT inhaler Inhale 2 puffs into the lungs 2 (two) times daily. 1 each 5   gabapentin (NEURONTIN) 100 MG capsule Take 1 capsule (100 mg total) by mouth at bedtime. (Patient not taking: Reported on 04/13/2022) 30 capsule 1   hydrocortisone (ANUSOL-HC) 25 MG suppository Place 1 suppository (25 mg total) rectally 2 (two) times daily. (Patient not taking: Reported on 04/13/2022) 12 suppository 0   lidocaine (XYLOCAINE) 5 % ointment Apply 1 application topically as needed. (Patient not taking: Reported on 04/13/2022) 35.44 g 0   sildenafil (REVATIO) 20 MG tablet TAKE 1 TABLET BY MOUTH 30-60 MINUTES PRIOR TO INTERCOURSE AS NEEDED. NO MORE THAN 1 TABLET  IN 24 HOURS. 30 tablet 2   No current facility-administered medications for this visit.    Family History Family History  Problem Relation Age of Onset   Colon cancer Neg Hx    Colon polyps Neg Hx    Esophageal cancer Neg Hx    Rectal cancer Neg Hx    Stomach cancer Neg Hx       Social History Social History   Tobacco Use   Smoking status: Former    Packs/day: 0.25    Years: 30.00    Total pack years:  7.50    Types: Cigarettes    Quit date: 12/16/2016    Years since quitting: 5.3   Smokeless tobacco: Never  Vaping Use   Vaping Use: Never used  Substance Use Topics   Alcohol use: No    Alcohol/week: 0.0 standard drinks of alcohol   Drug use: No        ROS ***   Physical Exam There were no vitals taken for this visit.   CONSTITUTIONAL: Well developed, and nourished, appropriately responsive and aware without distress. ***  EYES: Sclera non-icteric.   EARS, NOSE, MOUTH AND THROAT: Mask worn.  *** The oropharynx is clear. Oral mucosa is pink and moist.  Dentition: ***   Hearing is intact to voice.  NECK: Trachea is midline, and there is no jugular venous distension.  LYMPH NODES:  Lymph nodes in the neck are not enlarged. RESPIRATORY:  Lungs are clear, and breath sounds are equal bilaterally. Normal respiratory effort without pathologic use of accessory muscles. CARDIOVASCULAR: Heart is regular in rate and rhythm. GI: The abdomen is *** soft, nontender, and nondistended. There were no palpable masses. I did not appreciate hepatosplenomegaly. There were normal bowel sounds. GU: *** MUSCULOSKELETAL:  Symmetrical muscle tone appreciated in all four extremities.    SKIN: Skin turgor is normal. No pathologic skin lesions  appreciated.  NEUROLOGIC:  Motor and sensation appear grossly normal.  Cranial nerves are grossly without defect. PSYCH:  Alert and oriented to person, place and time. Affect is appropriate for situation.  Data Reviewed I have personally reviewed what is currently available of the patient's imaging, recent labs and medical records.   Labs:     Latest Ref Rng & Units 09/07/2021    3:01 PM 07/17/2021    4:00 PM 06/03/2021    8:28 PM  CBC  WBC 3.4 - 10.8 x10E3/uL 9.3  11.0  9.1   Hemoglobin 13.0 - 17.7 g/dL 14.5  14.1  15.1   Hematocrit 37.5 - 51.0 % 43.7  42.3  44.5   Platelets 150 - 450 x10E3/uL 183  169  208       Latest Ref Rng & Units 09/07/2021    3:01 PM  07/17/2021    4:00 PM 06/03/2021    8:28 PM  CMP  Glucose 70 - 99 mg/dL 78  119  91   BUN 6 - 24 mg/dL '14  19  17   '$ Creatinine 0.76 - 1.27 mg/dL 0.89  0.86  0.86   Sodium 134 - 144 mmol/L 143  136  137   Potassium 3.5 - 5.2 mmol/L 4.1  3.9  3.9   Chloride 96 - 106 mmol/L 102  104  101   CO2 20 - 29 mmol/L '26  24  28   '$ Calcium 8.7 - 10.2 mg/dL 9.4  9.0  9.5   Total Protein 6.0 - 8.5 g/dL 7.8     Total Bilirubin 0.0 - 1.2 mg/dL 0.3     Alkaline Phos 44 - 121 IU/L 85     AST 0 - 40 IU/L 21     ALT 0 - 44 IU/L 21      ***   Imaging: Radiology review:  CLINICAL DATA:  Right sided pain for 2 days, patient was picking up a dog crate and felt something tear in right sided pelvic area Right inguinal hernia Constipation Hx of prostate cancer with chemo, prev in pacs, hx of kidney stones   EXAM: CT ABDOMEN AND PELVIS WITH CONTRAST   TECHNIQUE: Multidetector CT imaging of the abdomen and pelvis was performed using the standard protocol following bolus administration of intravenous contrast.   CONTRAST:  145m ISOVUE-300 IOPAMIDOL (ISOVUE-300) INJECTION 61%   COMPARISON:  None.   FINDINGS: Lower chest: Lung bases are clear.   Hepatobiliary: No focal hepatic lesion. No biliary duct dilatation. Gallbladder is normal. Common bile duct is normal.   Pancreas: Pancreas is normal. No ductal dilatation. No pancreatic inflammation.   Spleen: Normal spleen   Adrenals/urinary tract: Adrenal glands and kidneys are normal. The ureters and bladder normal.   Stomach/Bowel: Stomach, small bowel, appendix, and cecum are normal. The colon and rectosigmoid colon are normal.   Vascular/Lymphatic: Abdominal aorta is normal caliber. There is no retroperitoneal or periportal lymphadenopathy. No pelvic lymphadenopathy.   Reproductive: Normal prostate   Other: Small fat filled inguinal hernias.   Musculoskeletal: No aggressive osseous lesion.   IMPRESSION: 1. No explanation for  RIGHT-sided pain. 2. Small fat filled inguinal hernias.     Electronically Signed   By: SSuzy BouchardM.D.   On: 10/08/2015 15:36 Within last 24 hrs: No results found.  Assessment    *** Patient Active Problem List   Diagnosis Date Noted   Prediabetes 04/14/2022   COPD (chronic obstructive pulmonary disease) with [place]ysema ([place] 07/19/2021   Hyperlipidemia  05/05/2021   Rectal bleeding 11/15/2020   Acute bronchitis 08/30/2018   Spell of dizziness 03/14/2018   Viral upper respiratory illness 03/14/2018   Cough variant asthma 11/27/2017   Cough 06/27/2017   Rib pain 06/27/2017    Plan    ***  Face-to-face time spent with the patient and accompanying care providers(if present) was *** minutes, with more than 50% of the time spent counseling, educating, and coordinating care of the patient.    These notes generated with voice recognition software. I apologize for typographical errors.  Ronny Bacon M.D., FACS 04/19/2022, 1:51 PM

## 2022-04-20 ENCOUNTER — Ambulatory Visit: Payer: Self-pay | Admitting: Surgery

## 2022-04-25 ENCOUNTER — Ambulatory Visit: Payer: Self-pay | Admitting: Surgery

## 2022-04-30 IMAGING — DX DG CHEST 1V PORT
1 series · 1 of 1 positions shown · non-contrast
Comparison: 06/03/2021

CLINICAL DATA: Cough, congestion and headache.

EXAM:
PORTABLE CHEST 1 VIEW

[chest]
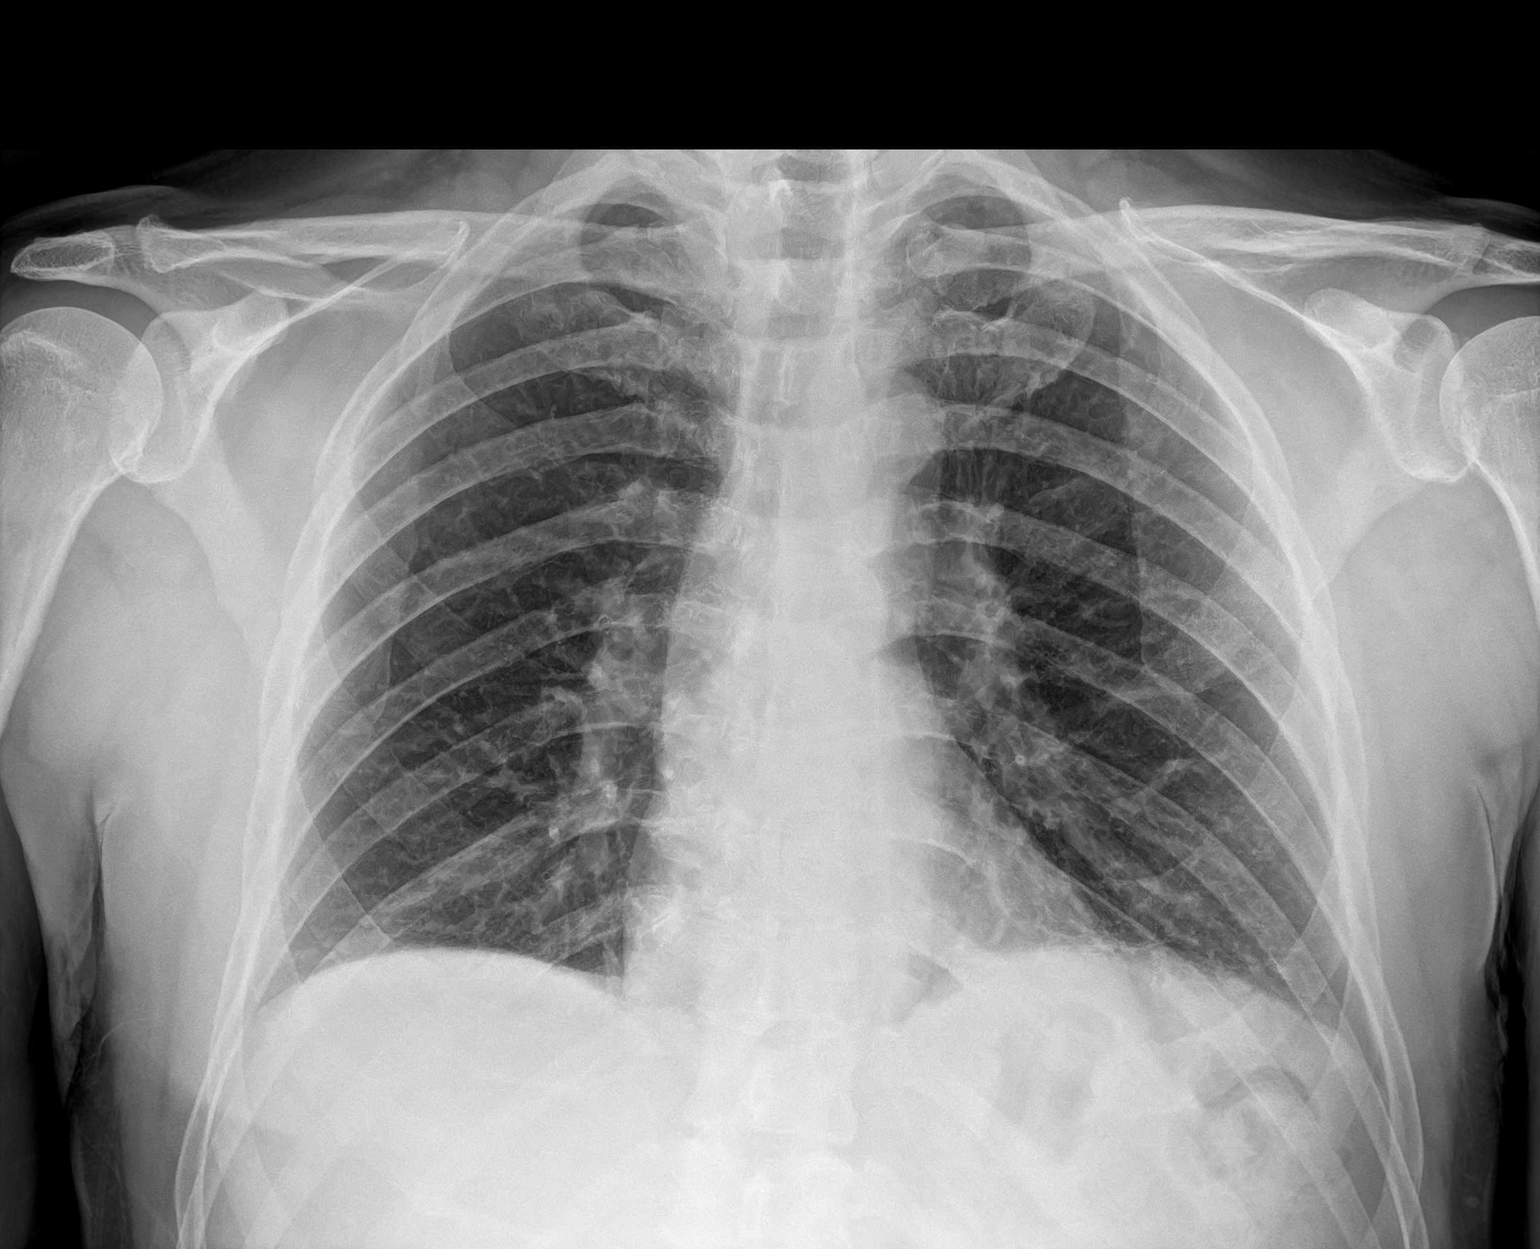

[1 of 1 positions shown; findings below may reference images not displayed]

FINDINGS: The cardiac silhouette, mediastinal and hilar contours are within
normal limits given the AP projection and portable technique. Low
lung volumes with vascular crowding and streaky atelectasis. Mild
peribronchial thickening could suggest bronchitis. No infiltrates or
effusions. No pulmonary lesions.
IMPRESSION: 1. Low lung volumes with vascular crowding and streaky atelectasis.
No pulmonary infiltrates or pleural effusions.
2. Possible bronchitis.

## 2022-05-02 ENCOUNTER — Other Ambulatory Visit: Payer: Self-pay

## 2022-05-02 ENCOUNTER — Encounter: Payer: Self-pay | Admitting: Surgery

## 2022-05-02 ENCOUNTER — Ambulatory Visit (INDEPENDENT_AMBULATORY_CARE_PROVIDER_SITE_OTHER): Payer: Self-pay | Admitting: Surgery

## 2022-05-02 ENCOUNTER — Telehealth: Payer: Self-pay

## 2022-05-02 VITALS — BP 148/78 | HR 74 | Temp 97.6°F | Ht 75.0 in | Wt 191.0 lb

## 2022-05-02 DIAGNOSIS — K4090 Unilateral inguinal hernia, without obstruction or gangrene, not specified as recurrent: Secondary | ICD-10-CM

## 2022-05-02 NOTE — Patient Instructions (Addendum)
Cardiology Clearance faxed to Dr.Charles Maylene Roes today.     Our surgery scheduler will call you within 24-48 hours to schedule your surgery. Please have the [facility] surgery sheet available when speaking with her.   Inguinal Hernia, Adult An inguinal hernia develops when fat or the intestines push through a weak spot in a muscle where the leg meets the lower abdomen (groin). This creates a bulge. This kind of hernia could also be: In the scrotum, if you are male. In folds of skin around the vagina, if you are male. There are three types of inguinal hernias: Hernias that can be pushed back into the abdomen (are reducible). This type rarely causes pain. Hernias that are not reducible (are incarcerated). Hernias that are not reducible and lose their blood supply (are strangulated). This type of hernia requires emergency surgery. What are the causes? This condition is caused by having a weak spot in the muscles or tissues in your groin. This develops over time. The hernia may poke through the weak spot when you suddenly strain your lower abdominal muscles, such as when you: Lift a heavy object. Strain to have a bowel movement. Constipation can lead to straining. Cough. What increases the risk? This condition is more likely to develop in: Males. Pregnant females. People who: Are overweight. Work in jobs that require long periods of standing or heavy lifting. Have had an inguinal hernia before. Smoke or have lung disease. These factors can lead to long-term (chronic) coughing. What are the signs or symptoms? Symptoms may depend on the size of the hernia. Often, a small inguinal hernia has no symptoms. Symptoms of a larger hernia may include: A bulge in the groin area. This is easier to see when standing. It might not be visible when lying down. Pain or burning in the groin. This may get worse when lifting, straining, or coughing. A dull ache or a feeling of pressure in the groin. An unusual  bulge in the scrotum, in males. Symptoms of a strangulated inguinal hernia may include: A bulge in your groin that is very painful and tender to the touch. A bulge that turns red or purple. Fever, nausea, and vomiting. Inability to have a bowel movement or to pass gas. How is this diagnosed? This condition is diagnosed based on your symptoms, your medical history, and a physical exam. Your health care provider may feel your groin area and ask you to cough. How is this treated? Treatment depends on the size of your hernia and whether you have symptoms. If you do not have symptoms, your health care provider may have you watch your hernia carefully and have you come in for follow-up visits. If your hernia is large or if you have symptoms, you may need surgery to repair the hernia. Follow these instructions at home: Lifestyle Avoid lifting heavy objects. Avoid standing for long periods of time. Do not use any products that contain nicotine or tobacco. These products include cigarettes, chewing tobacco, and vaping devices, such as e-cigarettes. If you need help quitting, ask your health care provider. Maintain a healthy weight. Preventing constipation You may need to take these actions to prevent or treat constipation: Drink enough fluid to keep your urine pale yellow. Take over-the-counter or prescription medicines. Eat foods that are high in fiber, such as beans, whole grains, and fresh fruits and vegetables. Limit foods that are high in fat and processed sugars, such as fried or sweet foods. General instructions You may try to push the hernia back  in place by very gently pressing on it while lying down. Do not try to force the bulge back in if it will not push in easily. Watch your hernia for any changes in shape, size, or color. Get help right away if you notice any changes. Take over-the-counter and prescription medicines only as told by your health care provider. Keep all follow-up visits.  This is important. Contact a health care provider if: You have a fever or chills. You develop new symptoms. Your symptoms get worse. Get help right away if: You have pain in your groin that suddenly gets worse. You have a bulge in your groin that: Suddenly gets bigger and does not get smaller. Becomes red or purple or painful to the touch. You are a man and you have a sudden pain in your scrotum, or the size of your scrotum suddenly changes. You cannot push the hernia back in place by very gently pressing on it when you are lying down. You have nausea or vomiting that does not go away. You have a fast heartbeat. You cannot have a bowel movement or pass gas. These symptoms may represent a serious problem that is an emergency. Do not wait to see if the symptoms will go away. Get medical help right away. Call your local emergency services (911 in the [place] Summary An inguinal hernia develops when fat or the intestines push through a weak spot in a muscle where your leg meets your lower abdomen (groin). This condition is caused by having a weak spot in muscles or tissues in your groin. Symptoms may depend on the size of the hernia, and they may include pain or swelling in your groin. A small inguinal hernia often has no symptoms. Treatment may not be needed if you do not have symptoms. If you have symptoms or a large hernia, you may need surgery to repair the hernia. Avoid lifting heavy objects. Also, avoid standing for long periods of time. This information is not intended to replace advice given to you by your health care provider. Make sure you discuss any questions you have with your health care provider. Document Revised: 03/02/2020 Document Reviewed: 03/02/2020 Elsevier Patient Education  [place].

## 2022-05-02 NOTE — Telephone Encounter (Signed)
Cardiology clearance faxed to Dr.Charles Maylene Roes at this time.

## 2022-05-02 NOTE — Progress Notes (Signed)
Patient ID: Jerry Steele, male   DOB: 12/10/1965, 56 y.o.   MRN: [id number]  Chief Complaint: Bilateral groin pain, right worse than left.  History of Present Illness Jerry Steele is a 56 y.o. male with a progressive history of increasing right groin pain..  This gentleman's emphysema and COPD seems to cause a significant amount of coughing, provoking some of the pain in his groin areas.  Right is significantly worse than the left. Reports more pain with heavy lifting.  Currently undergoing some cardiac work-up.  Past Medical History Past Medical History:  Diagnosis Date   Arthritis    hand   Emphysema lung (HCC)    GERD (gastroesophageal reflux disease)    Pneumonia       Past Surgical History:  Procedure Laterality Date   HEMORRHOID SURGERY     25 years ago    No Known Allergies  Current Outpatient Medications  Medication Sig Dispense Refill   albuterol (VENTOLIN HFA) 108 (90 Base) MCG/ACT inhaler Inhale 1-2 puffs into the lungs every 6 (six) hours as needed for wheezing or shortness of breath. 18 g 0   atorvastatin (LIPITOR) 40 MG tablet Take 1 tablet (40 mg total) by mouth daily. 30 tablet 2   budesonide-formoterol (SYMBICORT) 160-4.5 MCG/ACT inhaler Inhale 2 puffs into the lungs 2 (two) times daily. 1 each 5   sildenafil (REVATIO) 20 MG tablet TAKE 1 TABLET BY MOUTH 30-60 MINUTES PRIOR TO INTERCOURSE AS NEEDED. NO MORE THAN 1 TABLET  IN 24 HOURS. 30 tablet 2   No current facility-administered medications for this visit.    Family History Family History  Problem Relation Age of Onset   Colon cancer Neg Hx    Colon polyps Neg Hx    Esophageal cancer Neg Hx    Rectal cancer Neg Hx    Stomach cancer Neg Hx       Social History Social History   Tobacco Use   Smoking status: Former    Packs/day: 0.25    Years: 30.00    Total pack years: 7.50    Types: Cigarettes    Quit date: 12/16/2016    Years since quitting: 5.3   Smokeless tobacco:  Never  Vaping Use   Vaping Use: Never used  Substance Use Topics   Alcohol use: No    Alcohol/week: 0.0 standard drinks of alcohol   Drug use: No        Review of Systems  Constitutional: Negative.   Eyes:  Positive for blurred vision.  Respiratory:  Positive for cough, shortness of breath and stridor.   Cardiovascular:  Positive for chest pain and orthopnea.  Gastrointestinal:  Positive for abdominal pain and heartburn.  Genitourinary: Negative.   Skin: Negative.   Neurological:  Positive for dizziness and headaches.  Psychiatric/Behavioral:  Positive for depression.       Physical Exam Blood pressure (!) 148/78, pulse 74, temperature 97.6 F (36.4 C), temperature source Oral, height '6\' 3"'$  (1.905 m), weight 191 lb (86.6 kg), SpO2 97 %. Last Weight  Most recent update: 05/02/2022  2:46 PM    Weight  86.6 kg (191 lb)             CONSTITUTIONAL: Well developed, and nourished, appropriately responsive and aware without distress.    EYES: Sclera non-icteric.   EARS, NOSE, MOUTH AND THROAT: The oropharynx is clear. Oral mucosa is pink and moist.   Hearing is intact to voice.  NECK: Trachea is midline, and there is no jugular  venous distension.  LYMPH NODES:  Lymph nodes in the neck are not enlarged. RESPIRATORY:  Lungs are clear, and breath sounds are equal bilaterally. Normal respiratory effort without pathologic use of accessory muscles. CARDIOVASCULAR: Heart is regular in rate and rhythm. GI: The abdomen is soft, nontender, and nondistended. There were no palpable masses. I did not appreciate hepatosplenomegaly. There were normal bowel sounds. GU: Fat filled hernia on the left, larger hernia on the right.  Testes descended bilaterally. MUSCULOSKELETAL:  Symmetrical muscle tone appreciated in all four extremities.    SKIN: Skin turgor is normal. No pathologic skin lesions appreciated.  NEUROLOGIC:  Motor and sensation appear grossly normal.  Cranial nerves are grossly  without defect. PSYCH:  Alert and oriented to person, place and time. Affect is appropriate for situation.  Data Reviewed I have personally reviewed what is currently available of the patient's imaging, recent labs and medical records.   Labs:     Latest Ref Rng & Units 09/07/2021    3:01 PM 07/17/2021    4:00 PM 06/03/2021    8:28 PM  CBC  WBC 3.4 - 10.8 x10E3/uL 9.3  11.0  9.1   Hemoglobin 13.0 - 17.7 g/dL 14.5  14.1  15.1   Hematocrit 37.5 - 51.0 % 43.7  42.3  44.5   Platelets 150 - 450 x10E3/uL 183  169  208       Latest Ref Rng & Units 09/07/2021    3:01 PM 07/17/2021    4:00 PM 06/03/2021    8:28 PM  CMP  Glucose 70 - 99 mg/dL 78  119  91   BUN 6 - 24 mg/dL '14  19  17   '$ Creatinine 0.76 - 1.27 mg/dL 0.89  0.86  0.86   Sodium 134 - 144 mmol/L 143  136  137   Potassium 3.5 - 5.2 mmol/L 4.1  3.9  3.9   Chloride 96 - 106 mmol/L 102  104  101   CO2 20 - 29 mmol/L '26  24  28   '$ Calcium 8.7 - 10.2 mg/dL 9.4  9.0  9.5   Total Protein 6.0 - 8.5 g/dL 7.8     Total Bilirubin 0.0 - 1.2 mg/dL 0.3     Alkaline Phos 44 - 121 IU/L 85     AST 0 - 40 IU/L 21     ALT 0 - 44 IU/L 21         Imaging: CLINICAL DATA:  Right sided pain for 2 days, patient was picking up a dog crate and felt something tear in right sided pelvic area Right inguinal hernia Constipation Hx of prostate cancer with chemo, prev in pacs, hx of kidney stones   EXAM: CT ABDOMEN AND PELVIS WITH CONTRAST   TECHNIQUE: Multidetector CT imaging of the abdomen and pelvis was performed using the standard protocol following bolus administration of intravenous contrast.   CONTRAST:  157m ISOVUE-300 IOPAMIDOL (ISOVUE-300) INJECTION 61%   COMPARISON:  None.   FINDINGS: Lower chest: Lung bases are clear.   Hepatobiliary: No focal hepatic lesion. No biliary duct dilatation. Gallbladder is normal. Common bile duct is normal.   Pancreas: Pancreas is normal. No ductal dilatation. No pancreatic inflammation.   Spleen:  Normal spleen   Adrenals/urinary tract: Adrenal glands and kidneys are normal. The ureters and bladder normal.   Stomach/Bowel: Stomach, small bowel, appendix, and cecum are normal. The colon and rectosigmoid colon are normal.   Vascular/Lymphatic: Abdominal aorta is normal caliber. There is no  retroperitoneal or periportal lymphadenopathy. No pelvic lymphadenopathy.   Reproductive: Normal prostate   Other: Small fat filled inguinal hernias.   Musculoskeletal: No aggressive osseous lesion.   IMPRESSION: 1. No explanation for RIGHT-sided pain. 2. Small fat filled inguinal hernias.     Electronically Signed   By: Suzy Bouchard M.D.   On: 10/08/2015 15:36 Within last 24 hrs: No results found.  Assessment    Right inguinal hernia Patient Active Problem List   Diagnosis Date Noted   Prediabetes 04/14/2022   Dyspnea on exertion 04/12/2022   COPD (chronic obstructive pulmonary disease) with emphysema (HCC) 07/19/2021   Hyperlipidemia 05/05/2021   Rectal bleeding 11/15/2020   Acute bronchitis 08/30/2018   Spell of dizziness 03/14/2018   Viral upper respiratory illness 03/14/2018   Cough variant asthma 11/27/2017   Cough 06/27/2017   Rib pain 06/27/2017    Plan    Robotic repair of right inguinal hernia, possible bilateral.  Preoperative cardiac clearance.  I discussed possibility of incarceration, strangulation, enlargement in size over time, and the need for emergency surgery in the face of these.  Also reviewed the techniques of reduction should incarceration occur, and when unsuccessful to present to the ED.  Also discussed that surgery risks include recurrence which can be up to 30% in the case of complex hernias, use of prosthetic materials (mesh) and the increased risk of infection and the possible need for re-operation and removal of mesh, possibility of post-op SBO or ileus, and the risks of general anesthetic including heart attack, stroke, sudden death or some  reaction to anesthetic medications. The patient, and those present, appear to understand the risks, any and all questions were answered to the patient's satisfaction.  No guarantees were ever expressed or implied.   Face-to-face time spent with the patient and accompanying care providers(if present) was 50 minutes, with more than 50% of the time spent counseling, educating, and coordinating care of the patient.    These notes generated with voice recognition software. I apologize for typographical errors.  Ronny Bacon M.D., FACS 05/02/2022, 3:34 PM

## 2022-05-03 ENCOUNTER — Ambulatory Visit: Payer: Self-pay | Admitting: Urology

## 2022-05-04 ENCOUNTER — Ambulatory Visit (INDEPENDENT_AMBULATORY_CARE_PROVIDER_SITE_OTHER): Payer: PRIVATE HEALTH INSURANCE | Admitting: Urology

## 2022-05-04 ENCOUNTER — Encounter: Payer: Self-pay | Admitting: Urology

## 2022-05-04 VITALS — BP 115/75 | HR 67 | Ht 75.0 in | Wt 191.0 lb

## 2022-05-04 DIAGNOSIS — R5383 Other fatigue: Secondary | ICD-10-CM

## 2022-05-04 DIAGNOSIS — N529 Male erectile dysfunction, unspecified: Secondary | ICD-10-CM | POA: Diagnosis not present

## 2022-05-04 NOTE — Progress Notes (Signed)
05/04/2022 11:20 AM   [name] 01/22/1966 [id number]  Referring provider: Camillia Herter, NP [address] [place],  [place] [address]  Chief Complaint  Patient presents with   Erectile Dysfunction    HPI: Jerry Steele is a 56 y.o. male referred for evaluation of erectile dysfunction.  5 year history of difficulty achieving and maintaining an erection He noted symptom onset which she relates to increased stress after his son joined the [employer] Viagra and had significant flushing/headache and Cialis not effective Currently taking sildenafil 20 mg-3 tabs 1 hour prior to intercourse which he states works well Organic risk factors hyperlipidemia and prior smoking history (7.5 pack years) Does have some tiredness and fatigue Occasional scrotal pain PSA 08/2021 0.7   PMH: Past Medical History:  Diagnosis Date   Arthritis    hand   Emphysema lung (HCC)    GERD (gastroesophageal reflux disease)    Pneumonia     Surgical History: Past Surgical History:  Procedure Laterality Date   HEMORRHOID SURGERY     25 years ago    Home Medications:  Allergies as of 05/04/2022   No Known Allergies      Medication List        Accurate as of May 04, 2022 11:20 AM. If you have any questions, ask your nurse or doctor.          albuterol 108 (90 Base) MCG/ACT inhaler Commonly known as: VENTOLIN HFA Inhale 1-2 puffs into the lungs every 6 (six) hours as needed for wheezing or shortness of breath.   atorvastatin 40 MG tablet Commonly known as: LIPITOR Take 1 tablet (40 mg total) by mouth daily.   budesonide-formoterol 160-4.5 MCG/ACT inhaler Commonly known as: Symbicort Inhale 2 puffs into the lungs 2 (two) times daily.   sildenafil 20 MG tablet Commonly known as: REVATIO TAKE 1 TABLET BY MOUTH 30-60 MINUTES PRIOR TO INTERCOURSE AS NEEDED. NO MORE THAN 1 TABLET  IN 24 HOURS.        Allergies: No Known  Allergies  Family History: Family History  Problem Relation Age of Onset   Colon cancer Neg Hx    Colon polyps Neg Hx    Esophageal cancer Neg Hx    Rectal cancer Neg Hx    Stomach cancer Neg Hx     Social History:  reports that he quit smoking about 5 years ago. His smoking use included cigarettes. He has a 7.50 pack-year smoking history. He has never used smokeless tobacco. He reports that he does not drink alcohol and does not use drugs.   Physical Exam: BP 115/75   Pulse 67   Ht '6\' 3"'$  (1.905 m)   Wt 191 lb (86.6 kg)   BMI 23.87 kg/m   Constitutional:  Alert and oriented, No acute distress. HEENT: [place] AT, moist mucus membranes.  Trachea midline, no masses. Cardiovascular: No clubbing, cyanosis, or edema. Respiratory: Normal respiratory effort, no increased work of breathing. GI: Abdomen is soft, nontender, nondistended, no abdominal masses GU: Phallus without lesions.  Testes descended bilaterally that masses or tenderness, normal size.  Spermatic cord/epididymis palpably normal bilaterally. Skin: No rashes, bruises or suspicious lesions. Neurologic: Grossly intact, no focal deficits, moving all 4 extremities. Psychiatric: Normal mood and affect.   Assessment & Plan:    1.  Erectile dysfunction Sildenafil working well at 60 mg dose which she will continue Does relate to tiredness and fatigue and check baseline total testosterone    Koula Venier C  Avyon Herendeen, MD  [facility] [address], [address] [place], [place] [address] [phone]  

## 2022-05-05 LAB — TESTOSTERONE: Testosterone: 578 ng/dL (ref 264–916)

## 2022-05-08 ENCOUNTER — Encounter: Payer: Self-pay | Admitting: *Deleted

## 2022-05-23 ENCOUNTER — Telehealth: Payer: Self-pay

## 2022-05-23 NOTE — Telephone Encounter (Signed)
Cardiac  Clearance received from [facility] needs further work up- stress test.   Stress test scheduled 06/05/22 @ 11 am-per Levada Dy @ [facility] -patient has been notified.

## 2022-06-14 ENCOUNTER — Other Ambulatory Visit: Payer: Self-pay | Admitting: Family

## 2022-06-14 ENCOUNTER — Telehealth: Payer: Self-pay | Admitting: Surgery

## 2022-06-14 DIAGNOSIS — N529 Male erectile dysfunction, unspecified: Secondary | ICD-10-CM

## 2022-06-14 NOTE — Telephone Encounter (Signed)
Patient last saw Dr Christian Mate in office on 05/02/22 for right inguinal hernia, possible bilateral.  Patient has been undergoing cardiac work up.  Called patient, left message for him to call as it looks like he already had stress test done. Will need follow up in office again with Dr. Christian Mate for possible scheduling of his surgery if patient still wants to proceed.

## 2022-06-15 ENCOUNTER — Encounter: Payer: Self-pay | Admitting: Surgery

## 2022-06-15 ENCOUNTER — Ambulatory Visit (INDEPENDENT_AMBULATORY_CARE_PROVIDER_SITE_OTHER): Payer: Self-pay | Admitting: Surgery

## 2022-06-15 VITALS — BP 112/74 | HR 77 | Temp 98.0°F | Ht 75.0 in | Wt 193.0 lb

## 2022-06-15 DIAGNOSIS — K4090 Unilateral inguinal hernia, without obstruction or gangrene, not specified as recurrent: Secondary | ICD-10-CM

## 2022-06-15 DIAGNOSIS — K4020 Bilateral inguinal hernia, without obstruction or gangrene, not specified as recurrent: Secondary | ICD-10-CM

## 2022-06-15 NOTE — Patient Instructions (Signed)
You have chose to have your hernia repaired. This will be done by Dr. Rodenberg at [facility].  Please see your (blue) Pre-care information that you have been given today. Our surgery scheduler will call you to verify surgery date and to go over information.   You will need to arrange to be out of work for approximately 1-2 weeks and then you may return with a lifting restriction for 4 more weeks. If you have FMLA or Disability paperwork that needs to be filled out, please have your company fax your paperwork to [phone] or you may drop this by either office. This paperwork will be filled out within 3 days after your surgery has been completed.  You may have a bruise in your groin and also swelling and brusing in your testicle area. You may use ice 4-5 times daily for 15-20 minutes each time. Make sure that you place a barrier between you and the ice pack. To decrease the swelling, you may roll up a bath towel and place it vertically in between your thighs with your testicles resting on the towel. You will want to keep this area elevated as much as possible for several days following surgery.     Inguinal Hernia, Adult Muscles help keep everything in the body in its proper place. But if a weak spot in the muscles develops, something can poke through. That is called a hernia. When this happens in the lower part of the belly (abdomen), it is called an inguinal hernia. (It takes its name from a part of the body in this region called the inguinal canal.) A weak spot in the wall of muscles lets some fat or part of the small intestine bulge through. An inguinal hernia can develop at any age. Men get them more often than women. CAUSES  In adults, an inguinal hernia develops over time. It can be triggered by: Suddenly straining the muscles of the lower abdomen. Lifting heavy objects. Straining to have a bowel movement. Difficult bowel movements (constipation) can lead to this. Constant coughing. This may  be caused by smoking or lung disease. Being overweight. Being pregnant. Working at a job that requires long periods of standing or heavy lifting. Having had an inguinal hernia before. One type can be an emergency situation. It is called a strangulated inguinal hernia. It develops if part of the small intestine slips through the weak spot and cannot get back into the abdomen. The blood supply can be cut off. If that happens, part of the intestine may die. This situation requires emergency surgery. SYMPTOMS  Often, a small inguinal hernia has no symptoms. It is found when a healthcare provider does a physical exam. Larger hernias usually have symptoms.  In adults, symptoms may include: A lump in the groin. This is easier to see when the person is standing. It might disappear when lying down. In men, a lump in the scrotum. Pain or burning in the groin. This occurs especially when lifting, straining or coughing. A dull ache or feeling of pressure in the groin. Signs of a strangulated hernia can include: A bulge in the groin that becomes very painful and tender to the touch. A bulge that turns red or purple. Fever, nausea and vomiting. Inability to have a bowel movement or to pass gas. DIAGNOSIS  To decide if you have an inguinal hernia, a healthcare provider will probably do a physical examination. This will include asking questions about any symptoms you have noticed. The healthcare provider might   feel the groin area and ask you to cough. If an inguinal hernia is felt, the healthcare provider may try to slide it back into the abdomen. Usually no other tests are needed. TREATMENT  Treatments can vary. The size of the hernia makes a difference. Options include: Watchful waiting. This is often suggested if the hernia is small and you have had no symptoms. No medical procedure will be done unless symptoms develop. You will need to watch closely for symptoms. If any occur, contact your healthcare  provider right away. Surgery. This is used if the hernia is larger or you have symptoms. Open surgery. This is usually an outpatient procedure (you will not stay overnight in a hospital). An cut (incision) is made through the skin in the groin. The hernia is put back inside the abdomen. The weak area in the muscles is then repaired by herniorrhaphy or hernioplasty. Herniorrhaphy: in this type of surgery, the weak muscles are sewn back together. Hernioplasty: a patch or mesh is used to close the weak area in the abdominal wall. Laparoscopy. In this procedure, a surgeon makes small incisions. A thin tube with a tiny video camera (called a laparoscope) is put into the abdomen. The surgeon repairs the hernia with mesh by looking with the video camera and using two long instruments. HOME CARE INSTRUCTIONS  After surgery to repair an inguinal hernia: You will need to take pain medicine prescribed by your healthcare provider. Follow all directions carefully. You will need to take care of the wound from the incision. Your activity will be restricted for awhile. This will probably include no heavy lifting for several weeks. You also should not do anything too active for a few weeks. When you can return to work will depend on the type of job that you have. During "watchful waiting" periods, you should: Maintain a healthy weight. Eat a diet high in fiber (fruits, vegetables and whole grains). Drink plenty of fluids to avoid constipation. This means drinking enough water and other liquids to keep your urine clear or pale yellow. Do not lift heavy objects. Do not stand for long periods of time. Quit smoking. This should keep you from developing a frequent cough. SEEK MEDICAL CARE IF:  A bulge develops in your groin area. You feel pain, a burning sensation or pressure in the groin. This might be worse if you are lifting or straining. You develop a fever of more than 100.5 F (38.1 C). SEEK IMMEDIATE MEDICAL  CARE IF:  Pain in the groin increases suddenly. A bulge in the groin gets bigger suddenly and does not go down. For men, there is sudden pain in the scrotum. Or, the size of the scrotum increases. A bulge in the groin area becomes red or purple and is painful to touch. You have nausea or vomiting that does not go away. You feel your heart beating much faster than normal. You cannot have a bowel movement or pass gas. You develop a fever of more than 102.0 F (38.9 C).   This information is not intended to replace advice given to you by your health care provider. Make sure you discuss any questions you have with your health care provider.   Document Released: 11/19/2008 Document Revised: 09/25/2011 Document Reviewed: 01/04/2015 Elsevier Interactive Patient Education 2016 [employer] 

## 2022-06-15 NOTE — Progress Notes (Signed)
Patient ID: Jerry Steele, male   DOB: [birth year], 56 y.o.   MRN: [id number]  Chief Complaint: Bilateral groin pain, right worse than left.  History of Present Illness This visit is to review his recent cardiology evaluation, and ensure clearance to proceed with surgery.  He continues to have the same complaints of bilateral groin pain. Jerry Steele is a 56 y.o. male with a progressive history of increasing right groin pain..  This gentleman's emphysema and COPD seems to cause a significant amount of coughing, provoking some of the pain in his groin areas.  Right is significantly worse than the left. Reports more pain with heavy lifting.  Currently undergoing some cardiac work-up.  Past Medical History Past Medical History:  Diagnosis Date   Arthritis    hand   Emphysema lung (HCC)    GERD (gastroesophageal reflux disease)    Pneumonia       Past Surgical History:  Procedure Laterality Date   HEMORRHOID SURGERY     25 years ago    No Known Allergies  Current Outpatient Medications  Medication Sig Dispense Refill   albuterol (VENTOLIN HFA) 108 (90 Base) MCG/ACT inhaler Inhale 1-2 puffs into the lungs every 6 (six) hours as needed for wheezing or shortness of breath. 18 g 0   atorvastatin (LIPITOR) 40 MG tablet Take 1 tablet (40 mg total) by mouth daily. 30 tablet 2   budesonide-formoterol (SYMBICORT) 160-4.5 MCG/ACT inhaler Inhale 2 puffs into the lungs 2 (two) times daily. 1 each 5   sildenafil (REVATIO) 20 MG tablet TAKE 1 TABLET BY MOUTH 30 TO 60 MINUTES PRIOR TO INTERCOURSE AS NEEDED. NO MORE THAN 1 TABLET IN 24 HOURS 60 tablet 0   No current facility-administered medications for this visit.    Family History Family History  Problem Relation Age of Onset   Colon cancer Neg Hx    Colon polyps Neg Hx    Esophageal cancer Neg Hx    Rectal cancer Neg Hx    Stomach cancer Neg Hx       Social History Social History   Tobacco Use   Smoking status:  Former    Packs/day: 0.25    Years: 30.00    Total pack years: 7.50    Types: Cigarettes    Quit date: 12/16/2016    Years since quitting: 5.4   Smokeless tobacco: Never  Vaping Use   Vaping Use: Never used  Substance Use Topics   Alcohol use: No    Alcohol/week: 0.0 standard drinks of alcohol   Drug use: No        Review of Systems  Constitutional: Negative.   Eyes:  Positive for blurred vision.  Respiratory:  Positive for cough, shortness of breath and stridor.   Cardiovascular:  Positive for chest pain and orthopnea.  Gastrointestinal:  Positive for abdominal pain and heartburn.  Genitourinary: Negative.   Skin: Negative.   Neurological:  Positive for dizziness and headaches.  Psychiatric/Behavioral:  Positive for depression.       Physical Exam There were no vitals taken for this visit.    CONSTITUTIONAL: Well developed, and nourished, appropriately responsive and aware without distress.    EYES: Sclera non-icteric.   EARS, NOSE, MOUTH AND THROAT: The oropharynx is clear. Oral mucosa is pink and moist.   Hearing is intact to voice.  NECK: Trachea is midline, and there is no jugular venous distension.  LYMPH NODES:  Lymph nodes in the neck are not enlarged. RESPIRATORY:  Lungs are  clear, and breath sounds are equal bilaterally. Normal respiratory effort without pathologic use of accessory muscles. CARDIOVASCULAR: Heart is regular in rate and rhythm. GI: The abdomen is soft, nontender, and nondistended. There were no palpable masses. I did not appreciate hepatosplenomegaly. There were normal bowel sounds. GU: Fat filled hernia on the left, larger hernia on the right.  Testes descended bilaterally. MUSCULOSKELETAL:  Symmetrical muscle tone appreciated in all four extremities.    SKIN: Skin turgor is normal. No pathologic skin lesions appreciated.  NEUROLOGIC:  Motor and sensation appear grossly normal.  Cranial nerves are grossly without defect. PSYCH:  Alert and  oriented to person, place and time. Affect is appropriate for situation.  Data Reviewed I have personally reviewed what is currently available of the patient's imaging, recent labs and medical records.   Labs:     Latest Ref Rng & Units 09/07/2021    3:01 PM 07/17/2021    4:00 PM 06/03/2021    8:28 PM  CBC  WBC 3.4 - 10.8 x10E3/uL 9.3  11.0  9.1   Hemoglobin 13.0 - 17.7 g/dL 14.5  14.1  15.1   Hematocrit 37.5 - 51.0 % 43.7  42.3  44.5   Platelets 150 - 450 x10E3/uL 183  169  208       Latest Ref Rng & Units 09/07/2021    3:01 PM 07/17/2021    4:00 PM 06/03/2021    8:28 PM  CMP  Glucose 70 - 99 mg/dL 78  119  91   BUN 6 - 24 mg/dL '14  19  17   '$ Creatinine 0.76 - 1.27 mg/dL 0.89  0.86  0.86   Sodium 134 - 144 mmol/L 143  136  137   Potassium 3.5 - 5.2 mmol/L 4.1  3.9  3.9   Chloride 96 - 106 mmol/L 102  104  101   CO2 20 - 29 mmol/L '26  24  28   '$ Calcium 8.7 - 10.2 mg/dL 9.4  9.0  9.5   Total Protein 6.0 - 8.5 g/dL 7.8     Total Bilirubin 0.0 - 1.2 mg/dL 0.3     Alkaline Phos 44 - 121 IU/L 85     AST 0 - 40 IU/L 21     ALT 0 - 44 IU/L 21         Imaging: CLINICAL DATA:  Right sided pain for 2 days, patient was picking up a dog crate and felt something tear in right sided pelvic area Right inguinal hernia Constipation Hx of prostate cancer with chemo, prev in pacs, hx of kidney stones   EXAM: CT ABDOMEN AND PELVIS WITH CONTRAST   TECHNIQUE: Multidetector CT imaging of the abdomen and pelvis was performed using the standard protocol following bolus administration of intravenous contrast.   CONTRAST:  177m ISOVUE-300 IOPAMIDOL (ISOVUE-300) INJECTION 61%   COMPARISON:  None.   FINDINGS: Lower chest: Lung bases are clear.   Hepatobiliary: No focal hepatic lesion. No biliary duct dilatation. Gallbladder is normal. Common bile duct is normal.   Pancreas: Pancreas is normal. No ductal dilatation. No pancreatic inflammation.   Spleen: Normal spleen   Adrenals/urinary  tract: Adrenal glands and kidneys are normal. The ureters and bladder normal.   Stomach/Bowel: Stomach, small bowel, appendix, and cecum are normal. The colon and rectosigmoid colon are normal.   Vascular/Lymphatic: Abdominal aorta is normal caliber. There is no retroperitoneal or periportal lymphadenopathy. No pelvic lymphadenopathy.   Reproductive: Normal prostate   Other: Small fat filled  inguinal hernias.   Musculoskeletal: No aggressive osseous lesion.   IMPRESSION: 1. No explanation for RIGHT-sided pain. 2. Small fat filled inguinal hernias.     Electronically Signed   By: Suzy Bouchard M.D.   On: 10/08/2015 15:36 Within last 24 hrs: No results found.  Assessment    Bilateral groin pain with lifting. Bilateral fat filled inguinal hernias, potentially causative of the above pain. Patient Active Problem List   Diagnosis Date Noted   Prediabetes 04/14/2022   Dyspnea on exertion 04/12/2022   COPD (chronic obstructive pulmonary disease) with emphysema (HCC) 07/19/2021   Hyperlipidemia 05/05/2021   Rectal bleeding 11/15/2020   Acute bronchitis 08/30/2018   Spell of dizziness 03/14/2018   Viral upper respiratory illness 03/14/2018   Cough variant asthma 11/27/2017   Cough 06/27/2017   Rib pain 06/27/2017    Plan    Robotic repair of bilateral inguinal hernia.  I discussed possibility of incarceration, strangulation, enlargement in size over time, and the need for emergency surgery in the face of these.  We reviewed that though repair of the hernias will eliminate this as a potential source of pain, that his current pain syndrome may not be related entirely to the hernia itself and also due to other forms of groin strain.  Also discussed that surgery risks include recurrence which can be up to 30% in the case of complex hernias, use of prosthetic materials (mesh) and the increased risk of infection and the possible need for re-operation and removal of mesh,  possibility of post-op SBO or ileus, and the risks of general anesthetic including heart attack, stroke, sudden death or some reaction to anesthetic medications. The patient, and those present, appear to understand the risks, any and all questions were answered to the patient's satisfaction.  No guarantees were ever expressed or implied.   Face-to-face time spent with the patient and accompanying care providers(if present) was 50 minutes, with more than 50% of the time spent counseling, educating, and coordinating care of the patient.    These notes generated with voice recognition software. I apologize for typographical errors.  Ronny Bacon M.D., FACS 06/15/2022, 1:49 PM

## 2022-06-16 ENCOUNTER — Ambulatory Visit: Payer: Self-pay | Admitting: Surgery

## 2022-06-16 ENCOUNTER — Telehealth: Payer: Self-pay | Admitting: Surgery

## 2022-06-16 DIAGNOSIS — K4020 Bilateral inguinal hernia, without obstruction or gangrene, not specified as recurrent: Secondary | ICD-10-CM

## 2022-06-16 NOTE — Telephone Encounter (Signed)
Patient has been advised of Pre-Admission date/time, and Surgery date at [facility].  Surgery Date: 06/28/22 Preadmission Testing Date: 06/22/22 (phone 8a-1p)  Patient has been made aware to call [phone], between 1-3:00pm the day before surgery, to find out what time to arrive for surgery.

## 2022-06-20 ENCOUNTER — Telehealth: Payer: Self-pay

## 2022-06-20 NOTE — Telephone Encounter (Signed)
Cardiology Clearance received from [facility]- recommend further work up-stress test scheduled 06/05/2022.

## 2022-06-20 NOTE — Progress Notes (Unsigned)
Cardiology Clearance has been received from Dr Jeannine Kitten office. The patient is cleared at Low risk for surgery. No further optimization is needed.

## 2022-06-22 ENCOUNTER — Other Ambulatory Visit: Payer: Self-pay

## 2022-06-22 ENCOUNTER — Encounter
Admission: RE | Admit: 2022-06-22 | Discharge: 2022-06-22 | Disposition: A | Discharge status: Home / Self Care | Payer: PRIVATE HEALTH INSURANCE | Source: Ambulatory Visit | Attending: Surgery | Admitting: Surgery

## 2022-06-22 ENCOUNTER — Encounter: Payer: Self-pay | Admitting: Urgent Care

## 2022-06-22 ENCOUNTER — Encounter (HOSPITAL_COMMUNITY): Payer: Self-pay | Admitting: Urgent Care

## 2022-06-22 DIAGNOSIS — Z01812 Encounter for preprocedural laboratory examination: Secondary | ICD-10-CM

## 2022-06-22 HISTORY — DX: Unspecified asthma, uncomplicated: J45.909

## 2022-06-22 HISTORY — DX: Malignant neoplasm of prostate: C61

## 2022-06-22 HISTORY — DX: Dyspnea, unspecified: R06.00

## 2022-06-22 HISTORY — DX: Prediabetes: R73.03

## 2022-06-22 HISTORY — DX: Primary osteoarthritis, left hand: M19.041

## 2022-06-22 HISTORY — DX: Angina pectoris, unspecified: I20.9

## 2022-06-22 LAB — CBC
HCT: 45.3 % (ref 39.0–52.0)
Hemoglobin: 15 g/dL (ref 13.0–17.0)
MCH: 29.6 pg (ref 26.0–34.0)
MCHC: 33.1 g/dL (ref 30.0–36.0)
MCV: 89.3 fL (ref 80.0–100.0)
Platelets: 205 10*3/uL (ref 150–400)
RBC: 5.07 MIL/uL (ref 4.22–5.81)
RDW: 12.2 % (ref 11.5–15.5)
WBC: 7.6 10*3/uL (ref 4.0–10.5)
nRBC: 0 % (ref 0.0–0.2)

## 2022-06-22 LAB — BASIC METABOLIC PANEL
Anion gap: 5 (ref 5–15)
BUN: 20 mg/dL (ref 6–20)
CO2: 30 mmol/L (ref 22–32)
Calcium: 8.8 mg/dL — ABNORMAL LOW (ref 8.9–10.3)
Chloride: 103 mmol/L (ref 98–111)
Creatinine, Ser: 0.86 mg/dL (ref 0.61–1.24)
GFR, Estimated: >60 mL/min (ref >60)
Glucose, Bld: 81 mg/dL (ref 70–99)
Potassium: 4.1 mmol/L (ref 3.5–5.1)
Sodium: 138 mmol/L (ref 135–145)

## 2022-06-22 NOTE — Patient Instructions (Addendum)
Your procedure is scheduled on: 06/28/22 - Wednesday Report to the Registration Desk on the 1st floor of the [facility]. To find out your arrival time, please call [phone] between 1PM - 3PM on: 06/27/22 - Tuesday If your arrival time is 6:00 am, do not arrive prior to that time as the [facility] entrance doors do not open until 6:00 am.  REMEMBER: Instructions that are not followed completely may result in serious medical risk, up to and including death; or upon the discretion of your surgeon and anesthesiologist your surgery may need to be rescheduled.  Do not eat food or drink any liquids after midnight the night before surgery.  No gum chewing, lozengers or hard candies.   TAKE THESE MEDICATIONS THE MORNING OF SURGERY WITH A SIP OF WATER:  - albuterol (VENTOLIN HFA)    One week prior to surgery: Stop Anti-inflammatories (NSAIDS) such as Advil, Aleve, Ibuprofen, Motrin, Naproxen, Naprosyn and Aspirin based products such as Excedrin, Goodys Powder, BC Powder.  Stop ANY OVER THE COUNTER supplements until after surgery.  You may however, continue to take Tylenol if needed for pain up until the day of surgery.  No Alcohol for 24 hours before or after surgery.  No Smoking including e-cigarettes for 24 hours prior to surgery.  No chewable tobacco products for at least 6 hours prior to surgery.  No nicotine patches on the day of surgery.  Do not use any "recreational" drugs for at least a week prior to your surgery.  Please be advised that the combination of cocaine and anesthesia may have negative outcomes, up to and including death. If you test positive for cocaine, your surgery will be cancelled.  On the morning of surgery brush your teeth with toothpaste and water, you may rinse your mouth with mouthwash if you wish. Do not swallow any toothpaste or mouthwash.  Use CHG Soap or wipes as directed on instruction sheet.  Do not wear jewelry, make-up, hairpins, clips or  nail polish.  Do not wear lotions, powders, or perfumes.   Do not shave body from the neck down 48 hours prior to surgery just in case you cut yourself which could leave a site for infection.  Also, freshly shaved skin may become irritated if using the CHG soap.  Contact lenses, hearing aids and dentures may not be worn into surgery.  Do not bring valuables to the hospital. [facility] is not responsible for any missing/lost belongings or valuables.   Notify your doctor if there is any change in your medical condition (cold, fever, infection).  Wear comfortable clothing (specific to your surgery type) to the hospital.  After surgery, you can help prevent lung complications by doing breathing exercises.  Take deep breaths and cough every 1-2 hours. Your doctor may order a device called an Incentive Spirometer to help you take deep breaths. When coughing or sneezing, hold a pillow firmly against your incision with both hands. This is called "splinting." Doing this helps protect your incision. It also decreases belly discomfort.  If you are being admitted to the hospital overnight, leave your suitcase in the car. After surgery it may be brought to your room.  If you are being discharged the day of surgery, you will not be allowed to drive home. You will need a responsible adult (18 years or older) to drive you home and stay with you that night.   If you are taking public transportation, you will need to have a responsible adult (18  years or older) with you. Please confirm with your physician that it is acceptable to use public transportation.   Please call the [facility] Dept. at [phone] if you have any questions about these instructions.  Surgery Visitation Policy:  Patients undergoing a surgery or procedure may have two family members or support persons with them as long as the person is not COVID-19 positive or experiencing its symptoms.   Inpatient Visitation:     Visiting hours are 7 a.m. to 8 p.m. Up to four visitors are allowed at one time in a patient room. The visitors may rotate out with other people during the day. One designated support person (adult) may remain overnight.  Due to an increase in RSV and influenza rates and associated hospitalizations, children ages 18 and under will not be able to visit patients in [facility]. Masks continue to be strongly recommended.

## 2022-06-26 ENCOUNTER — Telehealth: Payer: Self-pay | Admitting: Surgery

## 2022-06-26 NOTE — Telephone Encounter (Signed)
Received call from the patient this morning.  He wants to cancel his surgery for 06/28/22.  Does not wish to reschedule at this time, he will call us back.  Patient cancelling, sounds very sick, says he has a lot of other health issues.  Is trying to get in to see his primary care doctor. Will call back when ready to reschedule.

## 2022-06-26 NOTE — Progress Notes (Unsigned)
Patient ID: [name], male    DOB: 1965-12-16  MRN: [id number]  CC: No chief complaint on file.   Subjective: Jerry Steele is a 56 y.o. male who presents for  His concerns today include:    wants to get labwork   06/22/2022 had BMP and CBC at [facility] pre admission testing   General Surgery - inguinal hernia  Urology - erectile dysfunction  Pulmonology - COPD, asthmatic bronchitis    06/26/2022 [facility] note: Received call from the patient this morning.  He wants to cancel his surgery for 06/28/22.  Does not wish to reschedule at this time, he will call us back.  Patient cancelling, sounds very sick, says he has a lot of other health issues.  Is trying to get in to see his primary care doctor. Will call back when ready to reschedule.          Last labs with me 04/13/2022: - Thyroid function normal. - Cholesterol stable. Continue Atorvastatin. - Hemoglobin A1c is consistent with prediabetes. Practice healthy eating habits of fresh fruit and vegetables, lean baked meats such as chicken, fish, and [place]; limit breads, rice, pastas, and desserts; practice regular aerobic exercise (at least 150 minutes a week as tolerated). No medication needed as of present. Recheck in 6 months.   Patient Active Problem List   Diagnosis Date Noted   Prediabetes 04/14/2022   Dyspnea on exertion 04/12/2022   COPD (chronic obstructive pulmonary disease) with emphysema (HCC) 07/19/2021   Hyperlipidemia 05/05/2021   Rectal bleeding 11/15/2020   Acute bronchitis 08/30/2018   Spell of dizziness 03/14/2018   Viral upper respiratory illness 03/14/2018   Cough variant asthma 11/27/2017   Cough 06/27/2017   Rib pain 06/27/2017     Current Outpatient Medications on File Prior to Visit  Medication Sig Dispense Refill   albuterol (VENTOLIN HFA) 108 (90 Base) MCG/ACT inhaler Inhale 1-2 puffs into the lungs every 6 (six) hours as needed for  wheezing or shortness of breath. 18 g 0   atorvastatin (LIPITOR) 40 MG tablet Take 1 tablet (40 mg total) by mouth daily. 30 tablet 2   sildenafil (REVATIO) 20 MG tablet TAKE 1 TABLET BY MOUTH 30 TO 60 MINUTES PRIOR TO INTERCOURSE AS NEEDED. NO MORE THAN 1 TABLET IN 24 HOURS 60 tablet 0   No current facility-administered medications on file prior to visit.    No Known Allergies  Social History   Socioeconomic History   Marital status: Divorced    Spouse name: Not on file   Number of children: Not on file   Years of education: Not on file   Highest education level: Not on file  Occupational History   Not on file  Tobacco Use   Smoking status: Former    Packs/day: 0.25    Years: 30.00    Total pack years: 7.50    Types: Cigarettes    Quit date: 12/16/2016    Years since quitting: 5.5    Passive exposure: Never   Smokeless tobacco: Never  Vaping Use   Vaping Use: Never used  Substance and Sexual Activity   Alcohol use: No    Alcohol/week: 0.0 standard drinks of alcohol   Drug use: No   Sexual activity: Yes  Other Topics Concern   Not on file  Social History Narrative   Lives with son   Social Determinants of Health   Financial Resource Strain: Not on file  Food Insecurity: Not on file  Transportation Needs: Not on file  Physical Activity: Not on file  Stress: Not on file  Social Connections: Not on file  Intimate Partner Violence: Not on file    Family History  Problem Relation Age of Onset   Sudden Cardiac Death Brother 65   Colon cancer Neg Hx    Colon polyps Neg Hx    Esophageal cancer Neg Hx    Rectal cancer Neg Hx    Stomach cancer Neg Hx     Past Surgical History:  Procedure Laterality Date   HEMORRHOID SURGERY     25 years ago    ROS: Review of Systems Negative except as stated above  PHYSICAL EXAM: There were no vitals taken for this visit.  Physical Exam  {male adult master:310786} {male adult master:310785}     Latest Ref Rng &  Units 06/22/2022   12:04 PM 09/07/2021    3:01 PM 07/17/2021    4:00 PM  CMP  Glucose 70 - 99 mg/dL 81  78  119   BUN 6 - 20 mg/dL '20  14  19   '$ Creatinine 0.61 - 1.24 mg/dL 0.86  0.89  0.86   Sodium 135 - 145 mmol/L 138  143  136   Potassium 3.5 - 5.1 mmol/L 4.1  4.1  3.9   Chloride 98 - 111 mmol/L 103  102  104   CO2 22 - 32 mmol/L '30  26  24   '$ Calcium 8.9 - 10.3 mg/dL 8.8  9.4  9.0   Total Protein 6.0 - 8.5 g/dL  7.8    Total Bilirubin 0.0 - 1.2 mg/dL  0.3    Alkaline Phos 44 - 121 IU/L  85    AST 0 - 40 IU/L  21    ALT 0 - 44 IU/L  21     Lipid Panel     Component Value Date/Time   CHOL 148 04/13/2022 0837   TRIG 111 04/13/2022 0837   HDL 47 04/13/2022 0837   CHOLHDL 3.1 04/13/2022 0837   CHOLHDL 5.2 (H) 03/31/2016 1417   VLDL 42 (H) 03/31/2016 1417   LDLCALC 81 04/13/2022 0837    CBC    Component Value Date/Time   WBC 7.6 06/22/2022 1204   RBC 5.07 06/22/2022 1204   HGB 15.0 06/22/2022 1204   HGB 14.5 09/07/2021 1501   HCT 45.3 06/22/2022 1204   HCT 43.7 09/07/2021 1501   PLT 205 06/22/2022 1204   PLT 183 09/07/2021 1501   MCV 89.3 06/22/2022 1204   MCV 90 09/07/2021 1501   MCH 29.6 06/22/2022 1204   MCHC 33.1 06/22/2022 1204   RDW 12.2 06/22/2022 1204   RDW 13.0 09/07/2021 1501   LYMPHSABS 1.9 11/11/2020 1842   MONOABS 0.6 03/14/2018 1112   EOSABS 0.3 11/11/2020 1842   BASOSABS 0.1 11/11/2020 1842    ASSESSMENT AND PLAN:  There are no diagnoses linked to this encounter.   Patient was given the opportunity to ask questions.  Patient verbalized understanding of the plan and was able to repeat key elements of the plan. Patient was given clear instructions to go to Emergency Department or return to medical center if symptoms don't improve, worsen, or new problems develop.The patient verbalized understanding.   No orders of the defined types were placed in this encounter.    Requested Prescriptions    No prescriptions requested or ordered in this  encounter    No follow-ups on file.  Camillia Herter, NP

## 2022-06-27 ENCOUNTER — Telehealth: Payer: Self-pay | Admitting: Family

## 2022-06-27 ENCOUNTER — Ambulatory Visit (INDEPENDENT_AMBULATORY_CARE_PROVIDER_SITE_OTHER): Payer: No Typology Code available for payment source | Admitting: Family

## 2022-06-27 ENCOUNTER — Encounter: Payer: Self-pay | Admitting: Family

## 2022-06-27 VITALS — BP 121/82 | HR 80 | Temp 98.3°F | Resp 16 | Ht 75.0 in | Wt 189.0 lb

## 2022-06-27 DIAGNOSIS — J45909 Unspecified asthma, uncomplicated: Secondary | ICD-10-CM | POA: Diagnosis not present

## 2022-06-27 DIAGNOSIS — R059 Cough, unspecified: Secondary | ICD-10-CM

## 2022-06-27 DIAGNOSIS — J449 Chronic obstructive pulmonary disease, unspecified: Secondary | ICD-10-CM | POA: Diagnosis not present

## 2022-06-27 DIAGNOSIS — F439 Reaction to severe stress, unspecified: Secondary | ICD-10-CM

## 2022-06-27 NOTE — Progress Notes (Signed)
Pt presents chest congestion -dry cough  -advised needs to talk to Pulmonologist about constant cough -pt request referral to therapist, states he has days where he is  constantly crying

## 2022-06-28 ENCOUNTER — Ambulatory Visit: Admission: RE | Admit: 2022-06-28 | Payer: PRIVATE HEALTH INSURANCE | Source: Ambulatory Visit | Admitting: Surgery

## 2022-06-28 ENCOUNTER — Encounter: Admission: RE | Payer: Self-pay | Source: Ambulatory Visit

## 2022-06-28 SURGERY — REPAIR, HERNIA, INGUINAL, BILATERAL, ROBOT-ASSISTED
Anesthesia: General | Laterality: Bilateral

## 2022-07-19 NOTE — Telephone Encounter (Signed)
Received, Jerry Steele is helping me get my patient calls lowered. She will contact him to provide him with any resources he may need to reduce his stress.

## 2022-07-19 NOTE — Telephone Encounter (Signed)
Patient declines counseling at this time he believes that Amy Minette Brine may have misinterpreted his feelings/emotions that day due to his language discrepancy. Contact information given for future reference.

## 2022-07-19 NOTE — Telephone Encounter (Signed)
Noted  

## 2022-07-24 ENCOUNTER — Other Ambulatory Visit: Payer: Self-pay | Admitting: Family

## 2022-07-24 DIAGNOSIS — N529 Male erectile dysfunction, unspecified: Secondary | ICD-10-CM

## 2022-07-24 NOTE — Progress Notes (Deleted)
Patient ID: Jerry Steele, male   DOB: 10/15/1965, 57 y.o.   MRN: [id number]  Chief Complaint: Bilateral groin pain, right worse than left.  History of Present Illness This visit is to review his recent cardiology evaluation, and ensure clearance to proceed with surgery.  He continues to have the same complaints of bilateral groin pain. Jerry Steele is a 57 y.o. male with a progressive history of increasing right groin pain..  This gentleman's emphysema and COPD seems to cause a significant amount of coughing, provoking some of the pain in his groin areas.  Right is significantly worse than the left. Reports more pain with heavy lifting.  Currently undergoing some cardiac work-up.  Past Medical History Past Medical History:  Diagnosis Date   Anginal pain ([place])    Arthritis of both hands    Asthma    Dyspnea    Emphysema lung (HCC)    GERD (gastroesophageal reflux disease)    Pneumonia    Pre-diabetes    Prostate cancer ([place])       Past Surgical History:  Procedure Laterality Date   HEMORRHOID SURGERY     25 years ago    No Known Allergies  Current Outpatient Medications  Medication Sig Dispense Refill   albuterol (VENTOLIN HFA) 108 (90 Base) MCG/ACT inhaler Inhale 1-2 puffs into the lungs every 6 (six) hours as needed for wheezing or shortness of breath. 18 g 0   amoxicillin-clavulanate (AUGMENTIN) 875-125 MG tablet Take 1 tablet by mouth 2 (two) times daily.     atorvastatin (LIPITOR) 40 MG tablet Take 1 tablet (40 mg total) by mouth daily. 30 tablet 2   dicyclomine (BENTYL) 20 MG tablet Take 20 mg by mouth 3 (three) times daily before meals.     ondansetron (ZOFRAN-ODT) 4 MG disintegrating tablet Take by mouth.     predniSONE (DELTASONE) 20 MG tablet Take 40 mg by mouth daily.     sildenafil (REVATIO) 20 MG tablet TAKE 1 TABLET BY MOUTH 30 TO 60 MINUTES PRIOR TO INTERCOURSE AS NEEDED. NO MORE THAN 1 TABLET IN 24 HOURS 60 tablet 0   No current facility-administered  medications for this visit.    Family History Family History  Problem Relation Age of Onset   Sudden Cardiac Death Brother 87   Colon cancer Neg Hx    Colon polyps Neg Hx    Esophageal cancer Neg Hx    Rectal cancer Neg Hx    Stomach cancer Neg Hx       Social History Social History   Tobacco Use   Smoking status: Former    Packs/day: 0.25    Years: 30.00    Total pack years: 7.50    Types: Cigarettes    Quit date: 12/16/2016    Years since quitting: 5.6    Passive exposure: Never   Smokeless tobacco: Never  Vaping Use   Vaping Use: Never used  Substance Use Topics   Alcohol use: No    Alcohol/week: 0.0 standard drinks of alcohol   Drug use: No        Review of Systems  Constitutional: Negative.   Eyes:  Positive for blurred vision.  Respiratory:  Positive for cough, shortness of breath and stridor.   Cardiovascular:  Positive for chest pain and orthopnea.  Gastrointestinal:  Positive for abdominal pain and heartburn.  Genitourinary: Negative.   Skin: Negative.   Neurological:  Positive for dizziness and headaches.  Psychiatric/Behavioral:  Positive for depression.  Physical Exam There were no vitals taken for this visit.    CONSTITUTIONAL: Well developed, and nourished, appropriately responsive and aware without distress.    EYES: Sclera non-icteric.   EARS, NOSE, MOUTH AND THROAT: The oropharynx is clear. Oral mucosa is pink and moist.   Hearing is intact to voice.  NECK: Trachea is midline, and there is no jugular venous distension.  LYMPH NODES:  Lymph nodes in the neck are not enlarged. RESPIRATORY:  Lungs are clear, and breath sounds are equal bilaterally. Normal respiratory effort without pathologic use of accessory muscles. CARDIOVASCULAR: Heart is regular in rate and rhythm. GI: The abdomen is soft, nontender, and nondistended. There were no palpable masses. I did not appreciate hepatosplenomegaly. There were normal bowel sounds. GU: Fat  filled hernia on the left, larger hernia on the right.  Testes descended bilaterally. MUSCULOSKELETAL:  Symmetrical muscle tone appreciated in all four extremities.    SKIN: Skin turgor is normal. No pathologic skin lesions appreciated.  NEUROLOGIC:  Motor and sensation appear grossly normal.  Cranial nerves are grossly without defect. PSYCH:  Alert and oriented to person, place and time. Affect is appropriate for situation.  Data Reviewed I have personally reviewed what is currently available of the patient's imaging, recent labs and medical records.   Labs:     Latest Ref Rng & Units 06/22/2022   12:04 PM 09/07/2021    3:01 PM 07/17/2021    4:00 PM  CBC  WBC 4.0 - 10.5 K/uL 7.6  9.3  11.0   Hemoglobin 13.0 - 17.0 g/dL 15.0  14.5  14.1   Hematocrit 39.0 - 52.0 % 45.3  43.7  42.3   Platelets 150 - 400 K/uL 205  183  169       Latest Ref Rng & Units 06/22/2022   12:04 PM 09/07/2021    3:01 PM 07/17/2021    4:00 PM  CMP  Glucose 70 - 99 mg/dL 81  78  119   BUN 6 - 20 mg/dL '20  14  19   '$ Creatinine 0.61 - 1.24 mg/dL 0.86  0.89  0.86   Sodium 135 - 145 mmol/L 138  143  136   Potassium 3.5 - 5.1 mmol/L 4.1  4.1  3.9   Chloride 98 - 111 mmol/L 103  102  104   CO2 22 - 32 mmol/L '30  26  24   '$ Calcium 8.9 - 10.3 mg/dL 8.8  9.4  9.0   Total Protein 6.0 - 8.5 g/dL  7.8    Total Bilirubin 0.0 - 1.2 mg/dL  0.3    Alkaline Phos 44 - 121 IU/L  85    AST 0 - 40 IU/L  21    ALT 0 - 44 IU/L  21        Imaging: CLINICAL DATA:  Right sided pain for 2 days, patient was picking up a dog crate and felt something tear in right sided pelvic area Right inguinal hernia Constipation Hx of prostate cancer with chemo, prev in pacs, hx of kidney stones   EXAM: CT ABDOMEN AND PELVIS WITH CONTRAST   TECHNIQUE: Multidetector CT imaging of the abdomen and pelvis was performed using the standard protocol following bolus administration of intravenous contrast.   CONTRAST:  194m ISOVUE-300 IOPAMIDOL  (ISOVUE-300) INJECTION 61%   COMPARISON:  None.   FINDINGS: Lower chest: Lung bases are clear.   Hepatobiliary: No focal hepatic lesion. No biliary duct dilatation. Gallbladder is normal. Common bile duct is  normal.   Pancreas: Pancreas is normal. No ductal dilatation. No pancreatic inflammation.   Spleen: Normal spleen   Adrenals/urinary tract: Adrenal glands and kidneys are normal. The ureters and bladder normal.   Stomach/Bowel: Stomach, small bowel, appendix, and cecum are normal. The colon and rectosigmoid colon are normal.   Vascular/Lymphatic: Abdominal aorta is normal caliber. There is no retroperitoneal or periportal lymphadenopathy. No pelvic lymphadenopathy.   Reproductive: Normal prostate   Other: Small fat filled inguinal hernias.   Musculoskeletal: No aggressive osseous lesion.   IMPRESSION: 1. No explanation for RIGHT-sided pain. 2. Small fat filled inguinal hernias.     Electronically Signed   By: Suzy Bouchard M.D.   On: 10/08/2015 15:36 Within last 24 hrs: No results found.  Assessment    Bilateral groin pain with lifting. Bilateral fat filled inguinal hernias, potentially causative of the above pain. Patient Active Problem List   Diagnosis Date Noted   Prediabetes 04/14/2022   Dyspnea on exertion 04/12/2022   COPD (chronic obstructive pulmonary disease) with emphysema (HCC) 07/19/2021   Hyperlipidemia 05/05/2021   Rectal bleeding 11/15/2020   Acute bronchitis 08/30/2018   Spell of dizziness 03/14/2018   Viral upper respiratory illness 03/14/2018   Cough variant asthma 11/27/2017   Cough 06/27/2017   Rib pain 06/27/2017    Plan    Robotic repair of bilateral inguinal hernia.  I discussed possibility of incarceration, strangulation, enlargement in size over time, and the need for emergency surgery in the face of these.  We reviewed that though repair of the hernias will eliminate this as a potential source of pain, that his current  pain syndrome may not be related entirely to the hernia itself and also due to other forms of groin strain.  Also discussed that surgery risks include recurrence which can be up to 30% in the case of complex hernias, use of prosthetic materials (mesh) and the increased risk of infection and the possible need for re-operation and removal of mesh, possibility of post-op SBO or ileus, and the risks of general anesthetic including heart attack, stroke, sudden death or some reaction to anesthetic medications. The patient, and those present, appear to understand the risks, any and all questions were answered to the patient's satisfaction.  No guarantees were ever expressed or implied.   Face-to-face time spent with the patient and accompanying care providers(if present) was 50 minutes, with more than 50% of the time spent counseling, educating, and coordinating care of the patient.    These notes generated with voice recognition software. I apologize for typographical errors.  Ronny Bacon M.D., FACS 07/24/2022, 12:47 PM

## 2022-07-24 NOTE — Telephone Encounter (Signed)
Medication Refill - Medication: sildenafil (REVATIO) 20 MG table   Has the patient contacted their pharmacy? Yes.   (Agent: If no, request that the patient contact the pharmacy for the refill. If patient does not wish to contact the pharmacy document the reason why and proceed with request.) (Agent: If yes, when and what did the pharmacy advise?)  Preferred Pharmacy (with phone number or street name):  [facility]  [place] [place] [address]  Phone: [phone] Fax: [phone]   Has the patient been seen for an appointment in the last year OR does the patient have an upcoming appointment? Yes.    Agent: Please be advised that RX refills may take up to 3 business days. We ask that you follow-up with your pharmacy.

## 2022-07-25 ENCOUNTER — Ambulatory Visit: Payer: No Typology Code available for payment source | Admitting: Surgery

## 2022-07-25 ENCOUNTER — Telehealth: Payer: Self-pay

## 2022-07-25 MED ORDER — SILDENAFIL CITRATE 20 MG PO TABS: ORAL_TABLET | ORAL | 0 refills | Status: DC

## 2022-07-25 NOTE — Telephone Encounter (Signed)
Requested Prescriptions  Pending Prescriptions Disp Refills   sildenafil (REVATIO) 20 MG tablet 60 tablet 0    Sig: TAKE 1 TABLET BY MOUTH 30 TO 60 MINUTES PRIOR TO INTERCOURSE AS NEEDED. NO MORE THAN 1 TABLET IN 24 HOURS     Urology: Erectile Dysfunction Agents Passed - 07/24/2022  4:41 PM      Passed - AST in normal range and within 360 days    AST  Date Value Ref Range Status  09/07/2021 21 0 - 40 IU/L Final         Passed - ALT in normal range and within 360 days    ALT  Date Value Ref Range Status  09/07/2021 21 0 - 44 IU/L Final         Passed - Last BP in normal range    BP Readings from Last 1 Encounters:  06/27/22 121/82         Passed - Valid encounter within last 12 months    Recent Outpatient Visits           4 weeks ago Cough, unspecified type   Primary Care at [facility], Amy J, NP   3 months ago Inguinal hernia without obstruction or gangrene, recurrence not specified, unspecified laterality   Primary Care at [facility], Amy J, NP   10 months ago Lower urinary tract symptoms (LUTS)   Primary Care at [facility], Amy J, NP   11 months ago Hyperlipidemia, unspecified hyperlipidemia type   Primary Care at [facility], Amy J, NP   1 year ago Chronic obstructive pulmonary disease, unspecified COPD type [facility])   Primary Care at [facility], Flonnie Hailstone, NP

## 2022-07-25 NOTE — Telephone Encounter (Signed)
Copied from [place] [id number]. Topic: Referral - Request for Referral >> Jul 24, 2022  4:27 PM Sabas Sous wrote: Has patient seen PCP for this complaint? Yes *If NO, is insurance requiring patient see PCP for this issue before PCP can refer them? Referral for which specialty: Urology  Preferred provider/office: Highest recommended in area Reason for referral: Erectile dysfunction  Pt referred to [facility] for erectile dysfunction on 09/08/21 and 04/13/22, pt was scheduled w/them on 05/13/22 referral details show appt was cancelled. Pt may call to see if he can reschedule [phone]

## 2022-07-27 ENCOUNTER — Ambulatory Visit (INDEPENDENT_AMBULATORY_CARE_PROVIDER_SITE_OTHER): Payer: BLUE CROSS/BLUE SHIELD | Admitting: Surgery

## 2022-07-27 ENCOUNTER — Other Ambulatory Visit (HOSPITAL_COMMUNITY): Payer: Self-pay

## 2022-07-27 ENCOUNTER — Encounter: Payer: Self-pay | Admitting: Surgery

## 2022-07-27 VITALS — BP 111/73 | HR 77 | Temp 97.9°F | Ht 75.0 in | Wt 194.0 lb

## 2022-07-27 DIAGNOSIS — K4020 Bilateral inguinal hernia, without obstruction or gangrene, not specified as recurrent: Secondary | ICD-10-CM

## 2022-07-27 DIAGNOSIS — K4090 Unilateral inguinal hernia, without obstruction or gangrene, not specified as recurrent: Secondary | ICD-10-CM

## 2022-07-27 NOTE — Progress Notes (Signed)
Patient ID: Jerry Steele, male   DOB: Oct 09, 1965, 57 y.o.   MRN: [id number]  Chief Complaint: Bilateral groin pain, right worse than left.  History of Present Illness This visit is to review his recent cardiology evaluation, and ensure clearance to proceed with surgery.  He continues to have the multiple complaints, including ED, headaches, etc. Jerry Steele is a 57 y.o. male with a progressive history of increasing right groin pain..  This gentleman's emphysema and COPD seems to cause coughing, provoking some of the pain in his groin areas.  Right is significantly worse than the left. Reports more pain with heavy lifting.  Currently undergoing some cardiac work-up.  Past Medical History Past Medical History:  Diagnosis Date   Anginal pain ([place])    Arthritis of both hands    Asthma    Dyspnea    Emphysema lung ([facility])    GERD (gastroesophageal reflux disease)    Pneumonia    Pre-diabetes    Prostate cancer ([place])       Past Surgical History:  Procedure Laterality Date   HEMORRHOID SURGERY     25 years ago    No Known Allergies  Current Outpatient Medications  Medication Sig Dispense Refill   albuterol (VENTOLIN HFA) 108 (90 Base) MCG/ACT inhaler Inhale 1-2 puffs into the lungs every 6 (six) hours as needed for wheezing or shortness of breath. 18 g 0   dicyclomine (BENTYL) 20 MG tablet Take 20 mg by mouth 3 (three) times daily before meals.     ondansetron (ZOFRAN-ODT) 4 MG disintegrating tablet Take by mouth.     sildenafil (REVATIO) 20 MG tablet TAKE 1 TABLET BY MOUTH 30 TO 60 MINUTES PRIOR TO INTERCOURSE AS NEEDED. NO MORE THAN 1 TABLET IN 24 HOURS 60 tablet 0   atorvastatin (LIPITOR) 40 MG tablet Take 1 tablet (40 mg total) by mouth daily. 30 tablet 2   No current facility-administered medications for this visit.    Family History Family History  Problem Relation Age of Onset   Sudden Cardiac Death Brother 19   Colon cancer Neg Hx    Colon polyps Neg Hx     Esophageal cancer Neg Hx    Rectal cancer Neg Hx    Stomach cancer Neg Hx       Social History Social History   Tobacco Use   Smoking status: Former    Packs/day: 0.25    Years: 30.00    Total pack years: 7.50    Types: Cigarettes    Quit date: 12/16/2016    Years since quitting: 5.6    Passive exposure: Never   Smokeless tobacco: Never  Vaping Use   Vaping Use: Never used  Substance Use Topics   Alcohol use: No    Alcohol/week: 0.0 standard drinks of alcohol   Drug use: No        Review of Systems  Constitutional: Negative.   Eyes:  Positive for blurred vision.  Respiratory:  Positive for cough, shortness of breath and stridor.   Cardiovascular:  Positive for chest pain and orthopnea.  Gastrointestinal:  Positive for abdominal pain and heartburn.  Genitourinary: Negative.   Skin: Negative.   Neurological:  Positive for dizziness and headaches.  Psychiatric/Behavioral:  Positive for depression.       Physical Exam Blood pressure 111/73, pulse 77, temperature 97.9 F (36.6 C), temperature source Oral, height '6\' 3"'$  (1.905 m), weight 194 lb (88 kg), SpO2 95 %. Last Weight  Most recent update: 07/27/2022  3:06 PM    Weight  88 kg (194 lb)              CONSTITUTIONAL: Well developed, and nourished, appropriately responsive and aware without distress.    EYES: Sclera non-icteric.   EARS, NOSE, MOUTH AND THROAT: The oropharynx is clear. Oral mucosa is pink and moist.   Hearing is intact to voice.  NECK: Trachea is midline, and there is no jugular venous distension.  LYMPH NODES:  Lymph nodes in the neck are not enlarged. RESPIRATORY:  Lungs are clear, and breath sounds are equal bilaterally. Normal respiratory effort without pathologic use of accessory muscles. CARDIOVASCULAR: Heart is regular in rate and rhythm. GI: The abdomen is soft, nontender, and nondistended. There were no palpable masses. I did not appreciate hepatosplenomegaly. There were normal bowel  sounds. GU: Fat filled hernia on the left, larger hernia on the right.  Testes descended bilaterally. MUSCULOSKELETAL:  Symmetrical muscle tone appreciated in all four extremities.    SKIN: Skin turgor is normal. No pathologic skin lesions appreciated.  NEUROLOGIC:  Motor and sensation appear grossly normal.  Cranial nerves are grossly without defect. PSYCH:  Alert and oriented to person, place and time. Affect is appropriate for situation.  Data Reviewed I have personally reviewed what is currently available of the patient's imaging, recent labs and medical records.   Labs:     Latest Ref Rng & Units 06/22/2022   12:04 PM 09/07/2021    3:01 PM 07/17/2021    4:00 PM  CBC  WBC 4.0 - 10.5 K/uL 7.6  9.3  11.0   Hemoglobin 13.0 - 17.0 g/dL 15.0  14.5  14.1   Hematocrit 39.0 - 52.0 % 45.3  43.7  42.3   Platelets 150 - 400 K/uL 205  183  169       Latest Ref Rng & Units 06/22/2022   12:04 PM 09/07/2021    3:01 PM 07/17/2021    4:00 PM  CMP  Glucose 70 - 99 mg/dL 81  78  119   BUN 6 - 20 mg/dL '20  14  19   '$ Creatinine 0.61 - 1.24 mg/dL 0.86  0.89  0.86   Sodium 135 - 145 mmol/L 138  143  136   Potassium 3.5 - 5.1 mmol/L 4.1  4.1  3.9   Chloride 98 - 111 mmol/L 103  102  104   CO2 22 - 32 mmol/L '30  26  24   '$ Calcium 8.9 - 10.3 mg/dL 8.8  9.4  9.0   Total Protein 6.0 - 8.5 g/dL  7.8    Total Bilirubin 0.0 - 1.2 mg/dL  0.3    Alkaline Phos 44 - 121 IU/L  85    AST 0 - 40 IU/L  21    ALT 0 - 44 IU/L  21        Imaging: CLINICAL DATA:  Right sided pain for 2 days, patient was picking up a dog crate and felt something tear in right sided pelvic area Right inguinal hernia Constipation Hx of prostate cancer with chemo, prev in pacs, hx of kidney stones   EXAM: CT ABDOMEN AND PELVIS WITH CONTRAST   TECHNIQUE: Multidetector CT imaging of the abdomen and pelvis was performed using the standard protocol following bolus administration of intravenous contrast.   CONTRAST:  151m  ISOVUE-300 IOPAMIDOL (ISOVUE-300) INJECTION 61%   COMPARISON:  None.   FINDINGS: Lower chest: Lung bases are clear.   Hepatobiliary: No focal hepatic lesion.  No biliary duct dilatation. Gallbladder is normal. Common bile duct is normal.   Pancreas: Pancreas is normal. No ductal dilatation. No pancreatic inflammation.   Spleen: Normal spleen   Adrenals/urinary tract: Adrenal glands and kidneys are normal. The ureters and bladder normal.   Stomach/Bowel: Stomach, small bowel, appendix, and cecum are normal. The colon and rectosigmoid colon are normal.   Vascular/Lymphatic: Abdominal aorta is normal caliber. There is no retroperitoneal or periportal lymphadenopathy. No pelvic lymphadenopathy.   Reproductive: Normal prostate   Other: Small fat filled inguinal hernias.   Musculoskeletal: No aggressive osseous lesion.   IMPRESSION: 1. No explanation for RIGHT-sided pain. 2. Small fat filled inguinal hernias.     Electronically Signed   By: Suzy Bouchard M.D.   On: 10/08/2015 15:36 Within last 24 hrs: No results found.  Assessment    Bilateral groin pain with lifting. Bilateral fat filled inguinal hernias, potentially causative of the above pain. Patient Active Problem List   Diagnosis Date Noted   Prediabetes 04/14/2022   Dyspnea on exertion 04/12/2022   COPD (chronic obstructive pulmonary disease) with emphysema ([facility]CC) 07/19/2021   Hyperlipidemia 05/05/2021   Rectal bleeding 11/15/2020   Acute bronchitis 08/30/2018   Spell of dizziness 03/14/2018   Viral upper respiratory illness 03/14/2018   Cough variant asthma 11/27/2017   Cough 06/27/2017   Rib pain 06/27/2017    Plan    Robotic repair of right, possible bilateral inguinal hernias.  I discussed possibility of incarceration, strangulation, enlargement in size over time, and the need for emergency surgery in the face of these.  We reviewed that though repair of the hernias will eliminate this as a  potential source of pain, that his current pain syndrome may not be related entirely to the hernia itself and also due to other forms of groin strain.  Also discussed that surgery risks include recurrence which can be up to 30% in the case of complex hernias, use of prosthetic materials (mesh) and the increased risk of infection and the possible need for re-operation and removal of mesh, possibility of post-op SBO or ileus, and the risks of general anesthetic including heart attack, stroke, sudden death or some reaction to anesthetic medications. The patient, and those present, appear to understand the risks, any and all questions were answered to the patient's satisfaction.  No guarantees were ever expressed or implied.   Face-to-face time spent with the patient and accompanying care providers(if present) was 50 minutes, with more than 50% of the time spent counseling, educating, and coordinating care of the patient.    These notes generated with voice recognition software. I apologize for typographical errors.  Ronny Bacon M.D., FACS 07/28/2022, 10:45 AM

## 2022-07-27 NOTE — Patient Instructions (Signed)
Our surgery scheduler Barbara will call you within 24-48 hours to get you scheduled. If you have not heard from her after 48 hours, please call our office. Have the blue sheet available when she calls to write down important information.   If you have any concerns or questions, please feel free to call our office.   Laparoscopic Inguinal Hernia Repair, Adult Laparoscopic inguinal hernia repair is a surgical procedure to repair a small, weak spot in the groin muscles that allows fat or intestines from inside the abdomen to bulge out (inguinal hernia). This procedure may be planned, or it may be an emergency procedure. During the procedure, tissue that has bulged out is moved back into place, and the opening in the groin muscles is repaired. This is done through three small incisions in the abdomen. A thin tube with a light and camera on the end (laparoscope) is used to help perform the procedure. Tell a health care provider about: Any allergies you have. All medicines you are taking, including vitamins, herbs, eye drops, creams, and over-the-counter medicines. Any problems you or family members have had with anesthetic medicines. Any blood disorders you have. Any surgeries you have had. Any medical conditions you have. Whether you are pregnant or may be pregnant. What are the risks? Generally, this is a safe procedure. However, problems may occur, including: Infection. Bleeding. Allergic reactions to medicines. Damage to nearby structures or organs. Testicle damage or long-term pain and swelling of the scrotum, in males. Inability to completely empty the bladder (urinary retention). Blood clots. A collection of fluid that builds up under the skin (seroma). The hernia coming back (recurrence). What happens before the procedure? Staying hydrated Follow instructions from your health care provider about hydration, which may include: Up to 2 hours before the procedure - you may continue to  drink clear liquids, such as water, clear fruit juice, black coffee, and plain tea.  Eating and drinking restrictions Follow instructions from your health care provider about eating and drinking, which may include: 8 hours before the procedure - stop eating heavy meals or foods, such as meat, fried foods, or fatty foods. 6 hours before the procedure - stop eating light meals or foods, such as toast or cereal. 6 hours before the procedure - stop drinking milk or drinks that contain milk. 2 hours before the procedure - stop drinking clear liquids. Medicines Ask your health care provider about: Changing or stopping your regular medicines. This is especially important if you are taking diabetes medicines or blood thinners. Taking medicines such as aspirin and ibuprofen. These medicines can thin your blood. Do not take these medicines unless your health care provider tells you to take them. Taking over-the-counter medicines, vitamins, herbs, and supplements. General instructions Do not use any products that contain nicotine or tobacco for at least 4 weeks before the procedure, if possible. These products include cigarettes, chewing tobacco, and vaping devices, such as e-cigarettes. If you need help quitting, ask your health care provider. Ask your health care provider: How your surgery site will be marked. What steps will be taken to help prevent infection. These steps may include: Removing hair at the surgery site. Washing skin with a germ-killing soap. Taking antibiotic medicine. Plan to have a responsible adult take you home from the hospital or clinic. Plan to have a responsible adult care for you for the time you are told after you leave the hospital or clinic. This is important. What happens during the procedure? An IV will   be inserted into one of your veins. You will be given one or more of the following: A medicine to help you relax (sedative). A medicine to make you fall asleep  (general anesthetic). Three small incisions will be made in your abdomen. Your abdomen will be inflated with carbon dioxide gas to make the surgical area easier to see. A laparoscope and surgical instruments will be inserted through the incisions. The laparoscope will send images of the inside of your abdomen to a monitor in the room. Tissue that is bulging through the hernia may be removed or moved back into place. The hernia opening will be closed with a sheet of surgical mesh. The surgical instruments and laparoscope will be removed. Your incisions will be closed with stitches (sutures) and adhesive strips. A bandage (dressing) will be placed over your incisions. The procedure may vary among health care providers and hospitals. What happens after the procedure? Your blood pressure, heart rate, breathing rate, and blood oxygen level will be monitored until you leave the hospital or clinic. You will be given pain medicine as needed. You may continue to receive medicines and fluids through an IV. The IV will be removed after you can drink fluids. You will be encouraged to get up and move around and to take deep breaths frequently. If you were given a sedative during the procedure, it can affect you for several hours. Do not drive or operate machinery until your health care provider says that it is safe. Summary Laparoscopic inguinal hernia repair is a surgical procedure to repair a small, weak spot in the groin muscles that allows fat or intestines from inside the abdomen to bulge out (inguinal hernia). This procedure is done through three small incisions in the abdomen. A thin tube with a light and camera on the end (laparoscope) is used to help perform the procedure. After the procedure, you will be encouraged to get up and move around and to take deep breaths frequently. This information is not intended to replace advice given to you by your health care provider. Make sure you discuss any  questions you have with your health care provider. Document Revised: 03/02/2020 Document Reviewed: 03/02/2020 Elsevier Patient Education  2023 [employer]  

## 2022-07-28 ENCOUNTER — Telehealth: Payer: Self-pay | Admitting: Surgery

## 2022-07-28 ENCOUNTER — Ambulatory Visit: Payer: Self-pay | Admitting: Surgery

## 2022-07-28 DIAGNOSIS — K4090 Unilateral inguinal hernia, without obstruction or gangrene, not specified as recurrent: Secondary | ICD-10-CM

## 2022-07-28 NOTE — Telephone Encounter (Signed)
Patient has been advised of Pre-Admission date/time, and Surgery date at [facility].  Surgery Date: 08/07/22 Preadmission Testing Date: 08/02/22 (phone 8a-1p)  Patient has been made aware to call [phone], between 1-3:00pm the day before surgery, to find out what time to arrive for surgery.    Patient again is reminded that [facility] is out of network with his health insurance ([place]), and per patient he still wants to proceed with surgery, says he will arrange for payment plan.

## 2022-08-02 ENCOUNTER — Encounter
Admission: RE | Admit: 2022-08-02 | Discharge: 2022-08-02 | Disposition: A | Discharge status: Home / Self Care | Payer: BLUE CROSS/BLUE SHIELD | Source: Ambulatory Visit | Attending: Surgery | Admitting: Surgery

## 2022-08-02 HISTORY — DX: Pure hypercholesterolemia, unspecified: E78.00

## 2022-08-02 HISTORY — DX: Hyperlipidemia, unspecified: E78.5

## 2022-08-02 NOTE — Patient Instructions (Addendum)
Su trmite est programado para el: lunes 73 de enero Presnt[place] en el mostrador de Aeronautical engineer del [facility]. Para saber su hora de llegada, llame al [phone] entre la 1:00 p. m. y las 3:00 p. m. el viernes 19 de enero. Si su hora de [place] 6:00 a. m., no llegue antes de esa hora ya que las puertas de [place] del [facility] no se abren [employer] 6:00 a. m.  RECORDAR: Las instrucciones que no se siguen completamente pueden provocar riesgos mdicos graves, que pueden llegar hasta la [place]; o, segn el criterio de su cirujano y Environmental health practitioner, es posible que sea Firefighter su Leisure centre manager.  No coma ni beba despus de la medianoche del da anterior a la ciruga. No mascar chicle, pastillas ni caramelos duros.  TOMA ESTOS MEDICAMENTOS LA MAANA DE LA CIRUGA CON UN sorbo de agua:  1. Nebulizador de ipratropio-albuterol 2. Atorvastatina (lipitor) 3. Omeprazol (Prilosec): (tome uno la noche anterior y otro la maana de la [place]; [place] a prevenir las nuseas despus de la [place]).  Una semana antes de la ciruga: a partir de hoy 15 de enero Detenga los antiinflamatorios (AINE) como Advil, Aleve, Ibuprofeno, Motrin, Naproxen, Naprosyn y productos a base de aspirina como Excedrin, Goodys Powder, [employer]. Suspenda CUALQUIER suplemento de venta libre hasta despus de la [place]. Sin embargo, puede Clinical biochemist tomando Tylenol si es necesario para Audiological scientist de la [place].  No consumir alcohol durante 24 horas antes o despus de la [place].  No fumar, incluidos los cigarrillos electrnicos, durante las 24 horas previas a la [place]. No consumir productos de tabaco masticables durante al menos 6 horas antes de la [place]. Sin parches de Special educational needs teacher de la [place].  No utilice ninguna droga "recreativa" durante al menos una semana antes de la [place]. Tenga en cuenta que la combinacin de cocana y anestesia puede [employer], que  pueden llegar hasta la [place]. Si su prueba de cocana da positivo, su ciruga ser cancelada.  La maana de la ciruga cepille sus dientes con pasta dental y agua, puede enjuagarse la boca con enjuague bucal si lo desea. No ingiera pasta de dientes ni enjuague bucal.  Utilice el jabn CHG como se indica en la hoja de instrucciones.  No use joyas, maquillaje, horquillas, clips ni esmalte de uas.  No use lociones, polvos ni perfumes.  No se afeite el cuerpo desde el cuello hacia abajo 48 horas antes de la ciruga en caso de que se corte, lo que podra dejar un sitio de infeccin. Adems, la piel recin afeitada puede irritarse si se utiliza el jabn CHG.  No se pueden usar lentes de contacto, audfonos ni dentaduras postizas durante la [place].  No lleve objetos de valor al hospital. [facility] no es responsable de ninguna pertenencia u objeto de valor perdido o perdido.  Notifique a su mdico si hay algn cambio en su condicin mdica (resfriado, fiebre, infeccin).  Lleve ropa cmoda (especfica para su tipo de [place]) al hospital.  Despus de la ciruga, usted puede ayudar a prevenir complicaciones pulmonares haciendo ejercicios de respiracin. Respire profundamente y tosa cada 1 o 2 horas. Su mdico puede indicarle un dispositivo llamado espirmetro incentivador para ayudarle a respirar profundamente. Al toser o Brewing technologist, sostenga firmemente una almohada contra la incisin con ambas manos. Esto se llama "ferulizacin". Hacer esto ayuda a proteger su incisin. [place] molestias abdominales.  Si le dan el alta el  da de la [place], no se le permitir conducir a casa. Necesitar que un adulto responsable (de 18 aos o ms) lo lleve a casa y se quede con usted esa noche.  Si viaja en transporte pblico, deber ir acompaado de un adulto responsable (de 18 aos o ms). Confirme con su mdico que es aceptable utilizar el transporte pblico.  Llame al Departamento de  pruebas previas a la admisin al [phone] si tiene alguna pregunta sobre estas instrucciones.  Poltica de visitas a ciruga:  [employer] se someten a [place] o procedimiento pueden [employer] familiares o personas de apoyo con ellos, siempre y cuando la persona no sea positiva para COVID-19 ni experimente sus sntomas.  Preparacin para la ciruga con jabn de GLUCONATO DE CLORHEXIDINA (CHG)  Jabn de gluconato de clorhexidina (CHG)  o Un limpiador antisptico que [employer] grmenes y se adhiere a la piel para seguir [employer] grmenes incluso despus del lavado.  o Se utiliza para ducharse la noche anterior a la [place] y la maana de la [place].  Antes de la [place], usted puede desempear un papel importante al reducir la cantidad de grmenes en su piel. El jabn CHG (gluconato de clorhexidina) es un limpiador antisptico que mata los grmenes y se adhiere a la piel para continuar matndolos incluso despus del lavado.  No lo utilice si es alrgico al CHG o a los jabones antibacterianos. Si su piel se enrojece o irrita, deje de usar CHG.  1. Ducharse la NOCHE ANTES DE LA CIRUGA y la [place] DE LA CIRUGA con jabn CHG.  2. Si eliges lavarte el cabello, lvalo primero como de costumbre con tu champ habitual.  3. Despus del champ, enjuague bien el cabello y el cuerpo para eliminar el champ.  4. Utilice CHG como lo hara con cualquier otro jabn lquido. Puede aplicar CHG directamente sobre la piel y lavar suavemente con un pauelo o una toallita limpia.  5. Aplique el jabn CHG en su cuerpo nicamente desde el cuello hacia abajo. No utilizar en heridas abiertas o llagas abiertas. Evite el contacto con los ojos, odos, boca y genitales (partes privadas). Lvese la cara y los genitales (partes privadas) con su jabn habitual.  6. Lvese bien, prestando especial atencin al rea donde se realizar su ciruga.  7. Enjuague bien su cuerpo con agua tibia.  8. No se  duche ni se lave con su jabn normal despus de usar y enjuagar el jabn CHG.  9. Squese dando palmaditas con una toalla limpia.  10. Use pijamas limpios para dormir la noche anterior a la ciruga.  12. Coloque sbanas limpias en su cama la noche de su primera ducha y no duerma con mascotas.  13. Ducharse nuevamente con el [place] ciruga antes de llegar al hospital.  14. No aplique desodorantes, lociones o polvos.  42. Por favor use ropa limpia al hospital.     Your procedure is scheduled on: Monday, January 22 Report to the Registration Desk on the 1st floor of the [facility]. To find out your arrival time, please call [phone] between 1PM - 3PM on: Friday, January 19 If your arrival time is 6:00 am, do not arrive prior to that time as the [facility] entrance doors do not open until 6:00 am.  REMEMBER: Instructions that are not followed completely may result in serious medical risk, up to and including death; or upon the discretion of your surgeon and anesthesiologist your  surgery may need to be rescheduled.  Do not eat or drink after midnight the night before surgery.  No gum chewing, lozengers or hard candies.  TAKE THESE MEDICATIONS THE MORNING OF SURGERY WITH A SIP OF WATER:  Ipratropium-albuterol nebulizer Atorvastatin (lipitor) Omeprazole (Prilosec) - (take one the night before and one on the morning of surgery - helps to prevent nausea after surgery.)  One week prior to surgery:starting today, January 17 Stop Anti-inflammatories (NSAIDS) such as Advil, Aleve, Ibuprofen, Motrin, Naproxen, Naprosyn and Aspirin based products such as Excedrin, Goodys Powder, BC Powder. Stop ANY OVER THE COUNTER supplements until after surgery. You may however, continue to take Tylenol if needed for pain up until the day of surgery.  No Alcohol for 24 hours before or after surgery.  No Smoking including e-cigarettes for 24 hours prior to surgery.  No chewable  tobacco products for at least 6 hours prior to surgery.  No nicotine patches on the day of surgery.  Do not use any "recreational" drugs for at least a week prior to your surgery.  Please be advised that the combination of cocaine and anesthesia may have negative outcomes, up to and including death. If you test positive for cocaine, your surgery will be cancelled.  On the morning of surgery brush your teeth with toothpaste and water, you may rinse your mouth with mouthwash if you wish. Do not swallow any toothpaste or mouthwash.  Use CHG Soap as directed on instruction sheet.  Do not wear jewelry, make-up, hairpins, clips or nail polish.  Do not wear lotions, powders, or perfumes.   Do not shave body from the neck down 48 hours prior to surgery just in case you cut yourself which could leave a site for infection.  Also, freshly shaved skin may become irritated if using the CHG soap.  Contact lenses, hearing aids and dentures may not be worn into surgery.  Do not bring valuables to the hospital. [facility] is not responsible for any missing/lost belongings or valuables.   Notify your doctor if there is any change in your medical condition (cold, fever, infection).  Wear comfortable clothing (specific to your surgery type) to the hospital.  After surgery, you can help prevent lung complications by doing breathing exercises.  Take deep breaths and cough every 1-2 hours. Your doctor may order a device called an Incentive Spirometer to help you take deep breaths. When coughing or sneezing, hold a pillow firmly against your incision with both hands. This is called "splinting." Doing this helps protect your incision. It also decreases belly discomfort.  If you are being discharged the day of surgery, you will not be allowed to drive home. You will need a responsible adult (18 years or older) to drive you home and stay with you that night.   If you are taking public transportation, you  will need to have a responsible adult (18 years or older) with you. Please confirm with your physician that it is acceptable to use public transportation.   Please call the [place] Dept. at [phone] if you have any questions about these instructions.  Surgery Visitation Policy:  Patients undergoing a surgery or procedure may have two family members or support persons with them as long as the person is not COVID-19 positive or experiencing its symptoms.      Preparing for Surgery with CHLORHEXIDINE GLUCONATE (CHG) Soap  Chlorhexidine Gluconate (CHG) Soap  o An antiseptic cleaner that kills germs and bonds with the skin to  continue killing germs even after washing  o Used for showering the night before surgery and morning of surgery  Before surgery, you can play an important role by reducing the number of germs on your skin.  CHG (Chlorhexidine gluconate) soap is an antiseptic cleanser which kills germs and bonds with the skin to continue killing germs even after washing.  Please do not use if you have an allergy to CHG or antibacterial soaps. If your skin becomes reddened/irritated stop using the CHG.  1. Shower the NIGHT BEFORE SURGERY and the MORNING OF SURGERY with CHG soap.  2. If you choose to wash your hair, wash your hair first as usual with your normal shampoo.  3. After shampooing, rinse your hair and body thoroughly to remove the shampoo.  4. Use CHG as you would any other liquid soap. You can apply CHG directly to the skin and wash gently with a scrungie or a clean washcloth.  5. Apply the CHG soap to your body only from the neck down. Do not use on open wounds or open sores. Avoid contact with your eyes, ears, mouth, and genitals (private parts). Wash face and genitals (private parts) with your normal soap.  6. Wash thoroughly, paying special attention to the area where your surgery will be performed.  7. Thoroughly rinse your body with warm  water.  8. Do not shower/wash with your normal soap after using and rinsing off the CHG soap.  9. Pat yourself dry with a clean towel.  10. Wear clean pajamas to bed the night before surgery.  12. Place clean sheets on your bed the night of your first shower and do not sleep with pets.  13. Shower again with the CHG soap on the day of surgery prior to arriving at the hospital.  14. Do not apply any deodorants/lotions/powders.  15. Please wear clean clothes to the hospital.

## 2022-08-03 ENCOUNTER — Encounter: Payer: Self-pay | Admitting: Urgent Care

## 2022-08-07 ENCOUNTER — Ambulatory Visit: Admission: RE | Admit: 2022-08-07 | Payer: BLUE CROSS/BLUE SHIELD | Source: Home / Self Care | Admitting: Surgery

## 2022-08-07 ENCOUNTER — Encounter: Admission: RE | Payer: Self-pay | Source: Home / Self Care

## 2022-08-07 SURGERY — HERNIORRHAPHY, INGUINAL, ROBOT-ASSISTED, LAPAROSCOPIC
Anesthesia: General | Laterality: Right

## 2022-08-11 NOTE — Progress Notes (Unsigned)
Patient ID: Jerry Steele, male    DOB: October 27, 1965  MRN: [id number]  CC: Cough  Subjective: [name] is a 57 y.o. male who presents for cough.   His concerns today include:   Estab w/ Cards   Last appt with Pulmonology 07/06/2022 per note: Assessment:  1. COPD, mild (HCC) budesonide-formoteroL (SYMBICORT) 160-4.5 mcg/actuation inhaler tiotropium (SPIRIVA) 18 mcg inhalation capsule albuterol 90 mcg/actuation inhaler levoFLOXacin (LEVAQUIN) 500 mg tablet  2. History of tobacco abuse  3. Pulmonary emphysema, unspecified emphysema type ([place])   Plan: 1) Mild COPD (FEV1= 71.1% predicted): - patient reports that his son (who he lives with) has been "sick" and he strongly feels his increased cough--- is due to this--- I prescribed Levaquin 500 mg PO x 7 days no refills --- to hopefully help prevent a possible AECOPD - patient reports that he has a [employer], now--- so I prescribed the following regiment---- Albuterol 90 mcg; Symbicort 160-4.5 mcg BID; Spiriva 18 mcg QD - continue other meds per EMR - pulmonary rehab referral - oxygen titration - social services consultation for prescription assistance - I had an in-depth conversation with patient about the importance of participating in early signs and symptoms reporting -- through their PCP or our office, to hopefully prevent a hospitalization/ re-admission. The patient verbalized understanding and agreed to do this. - The patient was given ample opportunity to ask questions, all of which were answered to their satisfaction. Patient verbalized understanding.  2) History of Tobacco Abuse (8.25 PPY): - I had an in-depth discussion about the importance of avoiding second-hand smoke exposure-- when at all possible. Patient verbalized understanding and agreed to do this.  - Return in about 2 months (around 09/06/2022) for Jerry Ledger, FNP-C clinic, COPD Management Follow-Up, with me.   Patient Active Problem List    Diagnosis Date Noted   Prediabetes 04/14/2022   Dyspnea on exertion 04/12/2022   COPD (chronic obstructive pulmonary disease) with emphysema (HCC) 07/19/2021   Hyperlipidemia 05/05/2021   Rectal bleeding 11/15/2020   Acute bronchitis 08/30/2018   Spell of dizziness 03/14/2018   Viral upper respiratory illness 03/14/2018   Cough variant asthma 11/27/2017   Cough 06/27/2017   Rib pain 06/27/2017     Current Outpatient Medications on File Prior to Visit  Medication Sig Dispense Refill   atorvastatin (LIPITOR) 40 MG tablet Take 1 tablet (40 mg total) by mouth daily. 30 tablet 2   ipratropium-albuterol (DUONEB) 0.5-2.5 (3) MG/3ML SOLN Take 3 mLs by nebulization every 12 (twelve) hours as needed.     omeprazole (PRILOSEC) 20 MG capsule Take 20 mg by mouth daily.     sildenafil (REVATIO) 20 MG tablet TAKE 1 TABLET BY MOUTH 30 TO 60 MINUTES PRIOR TO INTERCOURSE AS NEEDED. NO MORE THAN 1 TABLET IN 24 HOURS 60 tablet 0   No current facility-administered medications on file prior to visit.    No Known Allergies  Social History   Socioeconomic History   Marital status: Divorced    Spouse name: Not on file   Number of children: Not on file   Years of education: Not on file   Highest education level: Not on file  Occupational History   Not on file  Tobacco Use   Smoking status: Former    Packs/day: 0.25    Years: 30.00    Total pack years: 7.50    Types: Cigarettes    Quit date: 12/16/2016    Years since quitting: 5.6  Passive exposure: Never   Smokeless tobacco: Never  Vaping Use   Vaping Use: Never used  Substance and Sexual Activity   Alcohol use: No    Alcohol/week: 0.0 standard drinks of alcohol   Drug use: No   Sexual activity: Yes  Other Topics Concern   Not on file  Social History Narrative   Lives with son   Social Determinants of Health   Financial Resource Strain: Not on file  Food Insecurity: Not on file  Transportation Needs: Not on file  Physical  Activity: Not on file  Stress: Not on file  Social Connections: Not on file  Intimate Partner Violence: Not on file    Family History  Problem Relation Age of Onset   Sudden Cardiac Death Brother 71   Colon cancer Neg Hx    Colon polyps Neg Hx    Esophageal cancer Neg Hx    Rectal cancer Neg Hx    Stomach cancer Neg Hx     Past Surgical History:  Procedure Laterality Date   [place]   x2    ROS: Review of Systems Negative except as stated above  PHYSICAL EXAM: There were no vitals taken for this visit.  Physical Exam  {male adult master:310786} {male adult master:[id number]}     Latest Ref Rng & Units 06/22/2022   12:04 PM 09/07/2021    3:01 PM 07/17/2021    4:00 PM  CMP  Glucose 70 - 99 mg/dL 81  78  119   BUN 6 - 20 mg/dL '20  14  19   '$ Creatinine 0.61 - 1.24 mg/dL 0.86  0.89  0.86   Sodium 135 - 145 mmol/L 138  143  136   Potassium 3.5 - 5.1 mmol/L 4.1  4.1  3.9   Chloride 98 - 111 mmol/L 103  102  104   CO2 22 - 32 mmol/L '30  26  24   '$ Calcium 8.9 - 10.3 mg/dL 8.8  9.4  9.0   Total Protein 6.0 - 8.5 g/dL  7.8    Total Bilirubin 0.0 - 1.2 mg/dL  0.3    Alkaline Phos 44 - 121 IU/L  85    AST 0 - 40 IU/L  21    ALT 0 - 44 IU/L  21     Lipid Panel     Component Value Date/Time   CHOL 148 04/13/2022 0837   TRIG 111 04/13/2022 0837   HDL 47 04/13/2022 0837   CHOLHDL 3.1 04/13/2022 0837   CHOLHDL 5.2 (H) 03/31/2016 1417   VLDL 42 (H) 03/31/2016 1417   LDLCALC 81 04/13/2022 0837    CBC    Component Value Date/Time   WBC 7.6 06/22/2022 1204   RBC 5.07 06/22/2022 1204   HGB 15.0 06/22/2022 1204   HGB 14.5 09/07/2021 1501   HCT 45.3 06/22/2022 1204   HCT 43.7 09/07/2021 1501   PLT 205 06/22/2022 1204   PLT 183 09/07/2021 1501   MCV 89.3 06/22/2022 1204   MCV 90 09/07/2021 1501   MCH 29.6 06/22/2022 1204   MCHC 33.1 06/22/2022 1204   RDW 12.2 06/22/2022 1204   RDW 13.0 09/07/2021 1501   LYMPHSABS 1.9 11/11/2020 1842   MONOABS 0.6  03/14/2018 1112   EOSABS 0.3 11/11/2020 1842   BASOSABS 0.1 11/11/2020 1842    ASSESSMENT AND PLAN:  There are no diagnoses linked to this encounter.   Patient was given the opportunity to ask questions.  Patient verbalized understanding  of the plan and was able to repeat key elements of the plan. Patient was given clear instructions to go to Emergency Department or return to medical center if symptoms don't improve, worsen, or new problems develop.The patient verbalized understanding.   No orders of the defined types were placed in this encounter.    Requested Prescriptions    No prescriptions requested or ordered in this encounter    No follow-ups on file.  Camillia Herter, NP

## 2022-08-14 ENCOUNTER — Encounter: Payer: BLUE CROSS/BLUE SHIELD | Admitting: Family

## 2022-09-12 ENCOUNTER — Other Ambulatory Visit: Payer: Self-pay | Admitting: Family

## 2022-09-12 ENCOUNTER — Ambulatory Visit (INDEPENDENT_AMBULATORY_CARE_PROVIDER_SITE_OTHER): Payer: BLUE CROSS/BLUE SHIELD

## 2022-09-12 ENCOUNTER — Ambulatory Visit
Admission: EM | Admit: 2022-09-12 | Discharge: 2022-09-12 | Disposition: A | Discharge status: Home / Self Care | Payer: BLUE CROSS/BLUE SHIELD | Attending: Family Medicine | Admitting: Family Medicine

## 2022-09-12 DIAGNOSIS — N529 Male erectile dysfunction, unspecified: Secondary | ICD-10-CM

## 2022-09-12 DIAGNOSIS — R059 Cough, unspecified: Secondary | ICD-10-CM

## 2022-09-12 DIAGNOSIS — J441 Chronic obstructive pulmonary disease with (acute) exacerbation: Secondary | ICD-10-CM | POA: Diagnosis not present

## 2022-09-12 MED ORDER — BENZONATATE 100 MG PO CAPS: 100.0000 mg | ORAL_CAPSULE | Freq: Three times a day (TID) | ORAL | 0 refills | Status: DC | PRN

## 2022-09-12 MED ORDER — PREDNISONE 20 MG PO TABS: 40.0000 mg | ORAL_TABLET | Freq: Every day | ORAL | 0 refills | Status: AC

## 2022-09-12 NOTE — ED Provider Notes (Signed)
[name]    CSN: [id number] Arrival date & time: 09/12/22  1614      History   Chief Complaint Chief Complaint  Patient presents with   Cough    HPI Jerry Steele is a 57 y.o. male.    Cough  Here for cough that is been going on for about a month.  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  No fever at any point.  He is concerned for pneumonia.  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 Authorizing Provider  benzonatate (TESSALON) 100 MG capsule Take 1 capsule (100 mg total) by mouth 3 (three) times daily as needed for cough. 09/12/22  Yes Barrett Henle, MD  predniSONE (DELTASONE) 20 MG tablet Take 2 tablets (40 mg total) by mouth daily with breakfast for 5 days. 09/12/22 09/17/22 Yes Barrett Henle, MD  atorvastatin (LIPITOR) 40 MG tablet Take 1 tablet (40 mg total) by mouth daily. 04/14/22 08/02/22  Camillia Herter,  NP  ipratropium-albuterol (DUONEB) 0.5-2.5 (3) MG/3ML SOLN Take 3 mLs by nebulization every 12 (twelve) hours as needed.    [provider]  omeprazole (PRILOSEC) 20 MG capsule Take 20 mg by mouth daily.    [provider]  sildenafil (REVATIO) 20 MG tablet TAKE 1 TABLET BY MOUTH 30 TO 60 MINUTES PRIOR TO INTERCOURSE AS NEEDED. NO MORE THAN 1 TABLET IN 24 HOURS 07/25/22   Camillia Herter, NP    Family History Family History  Problem Relation Age of Onset   Sudden Cardiac Death Brother 48   Colon cancer Neg Hx    Colon polyps Neg Hx    Esophageal cancer Neg Hx    Rectal cancer Neg Hx    Stomach cancer Neg Hx     Social History Social History   Tobacco Use   Smoking status: Former    Packs/day: 0.25    Years: 30.00    Total pack years: 7.50    Types: Cigarettes    Quit date: 12/16/2016    Years since quitting: 5.7    Passive exposure: Never   Smokeless tobacco: Never  Vaping Use   Vaping Use: Never used  Substance Use Topics   Alcohol use: No    Alcohol/week: 0.0 standard  drinks of alcohol   Drug use: No     Allergies   Patient has no known allergies.   Review of Systems Review of Systems  Respiratory:  Positive for cough.      Physical Exam Triage Vital Signs ED Triage Vitals  Enc Vitals Group     BP 09/12/22 1709 124/68     Pulse Rate 09/12/22 1707 75     Resp 09/12/22 1707 18     Temp 09/12/22 1707 97.7 F (36.5 C)     Temp Source 09/12/22 1707 Oral     SpO2 09/12/22 1707 97 %     Weight --      Height --      Head Circumference --      Peak Flow --      Pain Score 09/12/22 1706 4     Pain Loc --      Pain Edu? --      Excl. in [place]? --    No data found.  Updated Vital Signs BP 124/68   Pulse 75   Temp 97.7 F (36.5 C) (Oral)   Resp 18   SpO2 97%   Visual Acuity Right Eye Distance:   Left Eye Distance:   Bilateral Distance:    Right Eye Near:   Left Eye Near:    Bilateral Near:     Physical Exam Vitals reviewed.   Constitutional:      General: He is not in acute distress.    Appearance: He is not toxic-appearing.  HENT:     Nose: Nose normal.     Mouth/Throat:     Mouth: Mucous membranes are moist.     Pharynx: No oropharyngeal exudate or posterior oropharyngeal erythema.  Eyes:     Extraocular Movements: Extraocular movements intact.     Conjunctiva/sclera: Conjunctivae normal.     Pupils: Pupils are equal, round, and reactive to light.  Cardiovascular:     Rate and Rhythm: Normal rate and regular rhythm.     Heart sounds: No murmur heard. Pulmonary:     Effort: No respiratory distress.     Breath sounds: No stridor. No wheezing, rhonchi or rales.     Comments: No wheezes heard at right, but he has difficulty exhaling without coughing. Musculoskeletal:     Cervical back: Neck supple.  Lymphadenopathy:     Cervical: No cervical adenopathy.  Skin:    Capillary Refill: Capillary refill takes less than 2 seconds.     Coloration: Skin is not jaundiced or pale.  Neurological:     General: No focal deficit present.     Mental Status: He is alert and oriented to person, place, and time.     Coordination: Abnormal coordination: .urg.  Psychiatric:        Behavior: Behavior normal.      UC Treatments / Results  Labs (all labs ordered are listed, but only abnormal results are displayed) Labs Reviewed - No data to display  EKG   Radiology DG Chest 2 View  Result Date: 09/12/2022 CLINICAL DATA:  Cough EXAM: CHEST - 2 VIEW COMPARISON:  07/17/2021 FINDINGS: The heart size and mediastinal contours are within normal limits. Both lungs are clear. The visualized skeletal structures are unremarkable. IMPRESSION: No active cardiopulmonary disease. Electronically Signed   By: Fidela Salisbury M.D.   On: 09/12/2022 17:41    Procedures Procedures (including critical care time)  Medications Ordered in UC Medications - No data to display  Initial Impression /  Assessment and Plan / UC Course  I  have reviewed the triage vital signs and the nursing notes.  Pertinent labs & imaging results that were available during my care of the patient were reviewed by me and considered in my medical decision making (see chart for details).        Chest x-ray is clear.  Tessalon Perles are sent in for cough  Prednisone is sent in and a burst for COPD exacerbation.  I have asked him to please follow-up with his pulmonary.  He does follow-up with doctors next week Final Clinical Impressions(s) / UC Diagnoses   Final diagnoses:  COPD exacerbation [facility])     Discharge Instructions      Your chest x-ray was clear.  I did not show any pneumonia or fluid in your lungs.  Continue her inhalers as they have been prescribed, including use your albuterol inhaler as needed every 4 hours for shortness of breath or wheezing  Take prednisone 20 mg--2 daily for 5 days  Take benzonatate 100 mg, 1 tab every 8 hours as needed for cough.  With your doctors as scheduled.     ED Prescriptions     Medication Sig Dispense Auth. Provider   benzonatate (TESSALON) 100 MG capsule Take 1 capsule (100 mg total) by mouth 3 (three) times daily as needed for cough. 21 capsule Barrett Henle, MD   predniSONE (DELTASONE) 20 MG tablet Take 2 tablets (40 mg total) by mouth daily with breakfast for 5 days. 10 tablet Windy Carina Gwenlyn Perking, MD      PDMP not reviewed this encounter.   Barrett Henle, MD 09/12/22 [phone]

## 2022-09-12 NOTE — Discharge Instructions (Signed)
Your chest x-ray was clear.  I did not show any pneumonia or fluid in your lungs.  Continue her inhalers as they have been prescribed, including use your albuterol inhaler as needed every 4 hours for shortness of breath or wheezing  Take prednisone 20 mg--2 daily for 5 days  Take benzonatate 100 mg, 1 tab every 8 hours as needed for cough.  With your doctors as scheduled.

## 2022-09-12 NOTE — ED Triage Notes (Signed)
Pt presents with non productive cough X 1 month; pt states he has some respiratory issues.

## 2022-09-13 NOTE — Telephone Encounter (Signed)
Requested Prescriptions  Pending Prescriptions Disp Refills   sildenafil (REVATIO) 20 MG tablet [Pharmacy Med Name: Sildenafil Citrate 20 MG Oral Tablet] 60 tablet 0    Sig: TAKE 1 TABLET BY MOUTH ONCE DAILY 30-60  MINUTES  PRIOR  TO  INTERCOURSE  AS  NEEDED.  NO  MORE  THAN  1  TABLET  IN  24  HOURS     Urology: Erectile Dysfunction Agents Failed - 09/12/2022  6:34 PM      Failed - AST in normal range and within 360 days    AST  Date Value Ref Range Status  09/07/2021 21 0 - 40 IU/L Final         Failed - ALT in normal range and within 360 days    ALT  Date Value Ref Range Status  09/07/2021 21 0 - 44 IU/L Final         Passed - Last BP in normal range    BP Readings from Last 1 Encounters:  09/12/22 124/68         Passed - Valid encounter within last 12 months    Recent Outpatient Visits           2 months ago Cough, unspecified type   [facility], Amy J, NP   5 months ago Inguinal hernia without obstruction or gangrene, recurrence not specified, unspecified laterality   [place] [facility], [place], NP   1 year ago Lower urinary tract symptoms (LUTS)   [place] [facility], Amy J, NP   1 year ago Hyperlipidemia, unspecified hyperlipidemia type   [facility] at [facility], Amy J, NP   1 year ago Chronic obstructive pulmonary disease, unspecified COPD type [facility])   [place] [facility] at [facility] [place], NP       Future Appointments             In 6 days Camillia Herter, NP [place] at [facility]

## 2022-09-15 NOTE — Progress Notes (Deleted)
Patient ID: Jerry Steele, male    DOB: 1965-09-10  MRN: [id number]  CC: Urgent Care Follow-Up  Subjective: Jerry Steele is a 57 y.o. male who presents for urgent care follow-up.   His concerns today include:  09/12/2022 [facility] per MD note: UC Treatments / Results  Labs (all labs ordered are listed, but only abnormal results are displayed) Labs Reviewed - No data to display   EKG     Radiology  Imaging Results (Last 48 hours)'[]'$ Expand by Default  DG Chest 2 View   Result Date: 09/12/2022 CLINICAL DATA:  Cough EXAM: CHEST - 2 VIEW COMPARISON:  07/17/2021 FINDINGS: The heart size and mediastinal contours are within normal limits. Both lungs are clear. The visualized skeletal structures are unremarkable. IMPRESSION: No active cardiopulmonary disease. Electronically Signed   By: Fidela Salisbury M.D.   On: 09/12/2022 17:41       Procedures Procedures (including critical care time)   Medications Ordered in UC Medications - No data to display  Initial Impression / Assessment and Plan / UC Course  I have reviewed the triage vital signs and the nursing notes.   Pertinent labs & imaging results that were available during my care of the patient were reviewed by me and considered in my medical decision making (see chart for details).   Chest x-ray is clear.  Tessalon Perles are sent in for cough   Prednisone is sent in and a burst for COPD exacerbation.  I have asked him to please follow-up with his pulmonary.  He does follow-up with doctors next week   Discharge Instructions        Your chest x-ray was clear.  I did not show any pneumonia or fluid in your lungs.   Continue her inhalers as they have been prescribed, including use your albuterol inhaler as needed every 4 hours for shortness of breath or wheezing   Take prednisone 20 mg--2 daily for 5 days   Take benzonatate 100 mg, 1 tab every 8 hours as needed for cough.    With your doctors as scheduled.   Today's visit 09/19/2022: Established with Pulmonology   Patient Active Problem List   Diagnosis Date Noted   Prediabetes 04/14/2022   Dyspnea on exertion 04/12/2022   COPD (chronic obstructive pulmonary disease) with emphysema (HCC) 07/19/2021   Hyperlipidemia 05/05/2021   Rectal bleeding 11/15/2020   Acute bronchitis 08/30/2018   Spell of dizziness 03/14/2018   Viral upper respiratory illness 03/14/2018   Cough variant asthma 11/27/2017   Cough 06/27/2017   Rib pain 06/27/2017     Current Outpatient Medications on File Prior to Visit  Medication Sig Dispense Refill   atorvastatin (LIPITOR) 40 MG tablet Take 1 tablet (40 mg total) by mouth daily. 30 tablet 2   benzonatate (TESSALON) 100 MG capsule Take 1 capsule (100 mg total) by mouth 3 (three) times daily as needed for cough. 21 capsule 0   ipratropium-albuterol (DUONEB) 0.5-2.5 (3) MG/3ML SOLN Take 3 mLs by nebulization every 12 (twelve) hours as needed.     omeprazole (PRILOSEC) 20 MG capsule Take 20 mg by mouth daily.     predniSONE (DELTASONE) 20 MG tablet Take 2 tablets (40 mg total) by mouth daily with breakfast for 5 days. 10 tablet 0   sildenafil (REVATIO) 20 MG tablet TAKE 1 TABLET BY MOUTH ONCE DAILY 30-60  MINUTES  PRIOR  TO  INTERCOURSE  AS  NEEDED.  NO  MORE  THAN  1  TABLET  IN  24  HOURS 60 tablet 0   No current facility-administered medications on file prior to visit.    No Known Allergies  Social History   Socioeconomic History   Marital status: Divorced    Spouse name: Not on file   Number of children: Not on file   Years of education: Not on file   Highest education level: Not on file  Occupational History   Not on file  Tobacco Use   Smoking status: Former    Packs/day: 0.25    Years: 30.00    Total pack years: 7.50    Types: Cigarettes    Quit date: 12/16/2016    Years since quitting: 5.7    Passive exposure: Never   Smokeless tobacco: Never  Vaping Use    Vaping Use: Never used  Substance and Sexual Activity   Alcohol use: No    Alcohol/week: 0.0 standard drinks of alcohol   Drug use: No   Sexual activity: Yes  Other Topics Concern   Not on file  Social History Narrative   Lives with son   Social Determinants of Health   Financial Resource Strain: Not on file  Food Insecurity: Not on file  Transportation Needs: Not on file  Physical Activity: Not on file  Stress: Not on file  Social Connections: Not on file  Intimate Partner Violence: Not on file    Family History  Problem Relation Age of Onset   Sudden Cardiac Death Brother 89   Colon cancer Neg Hx    Colon polyps Neg Hx    Esophageal cancer Neg Hx    Rectal cancer Neg Hx    Stomach cancer Neg Hx     Past Surgical History:  Procedure Laterality Date   [place]   x2    ROS: Review of Systems Negative except as stated above  PHYSICAL EXAM: There were no vitals taken for this visit.  Physical Exam  {male adult master:310786} {male adult master:310785}     Latest Ref Rng & Units 06/22/2022   12:04 PM 09/07/2021    3:01 PM 07/17/2021    4:00 PM  CMP  Glucose 70 - 99 mg/dL 81  78  119   BUN 6 - 20 mg/dL '20  14  19   '$ Creatinine 0.61 - 1.24 mg/dL 0.86  0.89  0.86   Sodium 135 - 145 mmol/L 138  143  136   Potassium 3.5 - 5.1 mmol/L 4.1  4.1  3.9   Chloride 98 - 111 mmol/L 103  102  104   CO2 22 - 32 mmol/L '30  26  24   '$ Calcium 8.9 - 10.3 mg/dL 8.8  9.4  9.0   Total Protein 6.0 - 8.5 g/dL  7.8    Total Bilirubin 0.0 - 1.2 mg/dL  0.3    Alkaline Phos 44 - 121 IU/L  85    AST 0 - 40 IU/L  21    ALT 0 - 44 IU/L  21     Lipid Panel     Component Value Date/Time   CHOL 148 04/13/2022 0837   TRIG 111 04/13/2022 0837   HDL 47 04/13/2022 0837   CHOLHDL 3.1 04/13/2022 0837   CHOLHDL 5.2 (H) 03/31/2016 1417   VLDL 42 (H) 03/31/2016 1417   LDLCALC 81 04/13/2022 0837    CBC    Component Value Date/Time   WBC 7.6 06/22/2022 1204   RBC  5.07  06/22/2022 1204   HGB 15.0 06/22/2022 1204   HGB 14.5 09/07/2021 1501   HCT 45.3 06/22/2022 1204   HCT 43.7 09/07/2021 1501   PLT 205 06/22/2022 1204   PLT 183 09/07/2021 1501   MCV 89.3 06/22/2022 1204   MCV 90 09/07/2021 1501   MCH 29.6 06/22/2022 1204   MCHC 33.1 06/22/2022 1204   RDW 12.2 06/22/2022 1204   RDW 13.0 09/07/2021 1501   LYMPHSABS 1.9 11/11/2020 1842   MONOABS 0.6 03/14/2018 1112   EOSABS 0.3 11/11/2020 1842   BASOSABS 0.1 11/11/2020 1842    ASSESSMENT AND PLAN:  There are no diagnoses linked to this encounter.   Patient was given the opportunity to ask questions.  Patient verbalized understanding of the plan and was able to repeat key elements of the plan. Patient was given clear instructions to go to Emergency Department or return to medical center if symptoms don't improve, worsen, or new problems develop.The patient verbalized understanding.   No orders of the defined types were placed in this encounter.    Requested Prescriptions    No prescriptions requested or ordered in this encounter    No follow-ups on file.  Camillia Herter, NP

## 2022-09-19 ENCOUNTER — Ambulatory Visit: Payer: BLUE CROSS/BLUE SHIELD | Admitting: Family

## 2022-09-19 DIAGNOSIS — J441 Chronic obstructive pulmonary disease with (acute) exacerbation: Secondary | ICD-10-CM

## 2022-09-26 ENCOUNTER — Ambulatory Visit: Payer: BLUE CROSS/BLUE SHIELD | Admitting: Surgery

## 2022-10-10 ENCOUNTER — Telehealth: Payer: Self-pay | Admitting: Surgery

## 2022-10-10 ENCOUNTER — Encounter: Payer: Self-pay | Admitting: Surgery

## 2022-10-10 ENCOUNTER — Ambulatory Visit (INDEPENDENT_AMBULATORY_CARE_PROVIDER_SITE_OTHER): Payer: 59 | Admitting: Surgery

## 2022-10-10 VITALS — BP 111/75 | HR 73 | Temp 98.0°F | Ht 75.0 in | Wt 190.8 lb

## 2022-10-10 DIAGNOSIS — K4020 Bilateral inguinal hernia, without obstruction or gangrene, not specified as recurrent: Secondary | ICD-10-CM | POA: Diagnosis not present

## 2022-10-10 DIAGNOSIS — K4090 Unilateral inguinal hernia, without obstruction or gangrene, not specified as recurrent: Secondary | ICD-10-CM | POA: Insufficient documentation

## 2022-10-10 NOTE — Patient Instructions (Addendum)
Our surgery scheduler Pamala Hurry will call you within 24-48 hours to get you scheduled. If you have not heard from her after 48 hours, please call our office. Have the blue sheet available when she calls to write down important information.   Inguinal Hernia, Adult An inguinal hernia is when fat or your intestines push through a weak spot in a muscle where your leg meets your lower belly (groin). This causes a bulge. This kind of hernia could also be: In your scrotum, if you are male. In folds of skin around your vagina, if you are male. There are three types of inguinal hernias: Hernias that can be pushed back into the belly (are reducible). This type rarely causes pain. Hernias that cannot be pushed back into the belly (are incarcerated). Hernias that cannot be pushed back into the belly and lose their blood supply (are strangulated). This type needs emergency surgery. What are the causes? This condition is caused by having a weak spot in the muscles or tissues in your groin. This develops over time. The hernia may poke through the weak spot when you strain your lower belly muscles all of a sudden, such as when you: Lift a heavy object. Strain to poop (have a bowel movement). Trouble pooping (constipation) can lead to straining. Cough. What increases the risk? This condition is more likely to develop in: Males. Pregnant females. People who: Are overweight. Work in jobs that require long periods of standing or heavy lifting. Have had an inguinal hernia before. Smoke or have lung disease. These factors can lead to long-term (chronic) coughing. What are the signs or symptoms? Symptoms may depend on the size of the hernia. Often, a small hernia has no symptoms. Symptoms of a larger hernia may include: A bulge in the groin area. This is easier to see when standing. You might not be able to see it when you are lying down. Pain or burning in the groin. This may get worse when you lift, strain,  or cough. A dull ache or a feeling of pressure in the groin. An abnormal bulge in the scrotum, in males. Symptoms of a strangulated inguinal hernia may include: A bulge in your groin that is very painful and tender to the touch. A bulge that turns red or purple. Fever, feeling like you may vomit (nausea), and vomiting. Not being able to poop or to pass gas. How is this treated? Treatment depends on the size of your hernia and whether you have symptoms. If you do not have symptoms, your doctor may have you watch your hernia carefully and have you come in for follow-up visits. If your hernia is large or if you have symptoms, you may need surgery to repair the hernia. Follow these instructions at home: Lifestyle Avoid lifting heavy objects. Avoid standing for long amounts of time. Do not smoke or use any products that contain nicotine or tobacco. If you need help quitting, ask your doctor. Stay at a healthy weight. Prevent trouble pooping You may need to take these actions to prevent or treat trouble pooping: Drink enough fluid to keep your pee (urine) pale yellow. Take over-the-counter or prescription medicines. Eat foods that are high in fiber. These include beans, whole grains, and fresh fruits and vegetables. Limit foods that are high in fat and sugar. These include fried or sweet foods. General instructions You may try to push your hernia back in place by very gently pressing on it when you are lying down. Do not try to  push the bulge back in if it will not go in easily. Watch your hernia for any changes in shape, size, or color. Tell your doctor if you see any changes. Take over-the-counter and prescription medicines only as told by your doctor. Keep all follow-up visits. Contact a doctor if: You have a fever or chills. You have new symptoms. Your symptoms get worse. Get help right away if: You have pain in your groin that gets worse all of a sudden. You have a bulge in your groin  that: Gets bigger all of a sudden, and it does not get smaller after that. Turns red or purple. Is painful when you touch it. You are a male, and you have: Sudden pain in your scrotum. A sudden change in the size of your scrotum. You cannot push the hernia back in place by very gently pressing on it when you are lying down. You feel like you may vomit, and that feeling does not go away. You keep vomiting. You have a fast heartbeat. You cannot poop or pass gas. These symptoms may be an emergency. Get help right away. Call your local emergency services (911 in the [place] Do not wait to see if the symptoms will go away. Do not drive yourself to the hospital. Summary An inguinal hernia is when fat or your intestines push through a weak spot in a muscle where your leg meets your lower belly (groin). This causes a bulge. If you do not have symptoms, you may not need treatment. If you have symptoms or a large hernia, you may need surgery. Avoid lifting heavy objects. Also, avoid standing for long amounts of time. Do not try to push the bulge back in if it will not go in easily. This information is not intended to replace advice given to you by your health care provider. Make sure you discuss any questions you have with your health care provider. Document Revised: 03/02/2020 Document Reviewed: 03/02/2020 Elsevier Patient Education  [place].

## 2022-10-10 NOTE — Progress Notes (Signed)
Patient ID: Jerry Steele, male   DOB: [birth year], 58 y.o.   MRN: [id number]  Chief Complaint: Bilateral groin pain, right worse than left.  History of Present Illness Reports that he had to cancel his last surgery due to bronchitis.  It seems that his insurance had indicated we were out of network.  He had been advised to find a Psychologist, sport and exercise in network likely within the [facility] system.  He makes no mention of this today, and wants to pursue hernia repair. Currently reports his pain can wax as high as an 8 out of 10, complains of fatigue.  Denies nausea, vomiting, fevers or chills. January visit was to review his recent cardiology evaluation, and ensure clearance to proceed with surgery.  He continues to have the multiple complaints, including ED, headaches, etc. Jerry Steele is a 57 y.o. male with a progressive history of bilateral groin pain..  This gentleman's emphysema and COPD seems to cause coughing, provoking some of the pain in his groin areas.  Right is significantly worse than the left. Reports more pain with heavy lifting.   Past Medical History Past Medical History:  Diagnosis Date   Anginal pain ([place])    Arthritis of both hands    Asthma    COPD (chronic obstructive pulmonary disease) ([place])    Dyspnea    Emphysema lung (HCC)    Former smoker    GERD (gastroesophageal reflux disease)    Hyperlipidemia    Pneumonia    Pre-diabetes    Prostate cancer ([place])       Past Surgical History:  Procedure Laterality Date   HEMORRHOID SURGERY  1985   x2    No Known Allergies  Current Outpatient Medications  Medication Sig Dispense Refill   atorvastatin (LIPITOR) 40 MG tablet Take 1 tablet (40 mg total) by mouth daily. 30 tablet 2   benzonatate (TESSALON) 100 MG capsule Take 1 capsule (100 mg total) by mouth 3 (three) times daily as needed for cough. 21 capsule 0   ipratropium-albuterol (DUONEB) 0.5-2.5 (3) MG/3ML SOLN Take 3 mLs by nebulization every 12 (twelve) hours as  needed.     omeprazole (PRILOSEC) 20 MG capsule Take 20 mg by mouth daily.     sildenafil (REVATIO) 20 MG tablet TAKE 1 TABLET BY MOUTH ONCE DAILY 30-60  MINUTES  PRIOR  TO  INTERCOURSE  AS  NEEDED.  NO  MORE  THAN  1  TABLET  IN  24  HOURS 60 tablet 0   No current facility-administered medications for this visit.    Family History Family History  Problem Relation Age of Onset   Sudden Cardiac Death Brother 3   Colon cancer Neg Hx    Colon polyps Neg Hx    Esophageal cancer Neg Hx    Rectal cancer Neg Hx    Stomach cancer Neg Hx       Social History Social History   Tobacco Use   Smoking status: Former    Packs/day: 0.25    Years: 30.00    Additional pack years: 0.00    Total pack years: 7.50    Types: Cigarettes    Quit date: 12/16/2016    Years since quitting: 5.8    Passive exposure: Never   Smokeless tobacco: Never  Vaping Use   Vaping Use: Never used  Substance Use Topics   Alcohol use: No    Alcohol/week: 0.0 standard drinks of alcohol   Drug use: No  Review of Systems  Constitutional: Negative.   Eyes:  Positive for blurred vision.  Respiratory:  Positive for cough, shortness of breath and stridor.   Gastrointestinal:  Positive for abdominal pain and heartburn.  Genitourinary: Negative.   Skin: Negative.   Neurological:  Positive for dizziness and headaches.  Psychiatric/Behavioral:  Positive for depression.       Physical Exam Blood pressure 111/75, pulse 73, temperature 98 F (36.7 C), temperature source Oral, height 6\' 3"  (1.905 m), weight 190 lb 12.8 oz (86.5 kg), SpO2 97 %. Last Weight  Most recent update: 10/10/2022 11:34 AM    Weight  86.5 kg (190 lb 12.8 oz)              CONSTITUTIONAL: Well developed, and nourished, appropriately responsive and aware without distress.    EYES: Sclera non-icteric.   EARS, NOSE, MOUTH AND THROAT: The oropharynx is clear. Oral mucosa is pink and moist.   Hearing is intact to voice.  NECK:  Trachea is midline, and there is no jugular venous distension.  LYMPH NODES:  Lymph nodes in the neck are not enlarged. RESPIRATORY:  Lungs are clear, and breath sounds are equal bilaterally. Normal respiratory effort without pathologic use of accessory muscles. CARDIOVASCULAR: Heart is regular in rate and rhythm. GI: The abdomen is soft, nontender, and nondistended. There were no palpable masses. I did not appreciate hepatosplenomegaly. There were normal bowel sounds. GU: Fat filled hernia on the left, larger hernia on the right.  Testes descended bilaterally. MUSCULOSKELETAL:  Symmetrical muscle tone appreciated in all four extremities.    SKIN: Skin turgor is normal. No pathologic skin lesions appreciated.  NEUROLOGIC:  Motor and sensation appear grossly normal.  Cranial nerves are grossly without defect. PSYCH:  Alert and oriented to person, place and time. Affect is appropriate for situation.  Data Reviewed I have personally reviewed what is currently available of the patient's imaging, recent labs and medical records.   Labs:     Latest Ref Rng & Units 06/22/2022   12:04 PM 09/07/2021    3:01 PM 07/17/2021    4:00 PM  CBC  WBC 4.0 - 10.5 K/uL 7.6  9.3  11.0   Hemoglobin 13.0 - 17.0 g/dL 15.0  14.5  14.1   Hematocrit 39.0 - 52.0 % 45.3  43.7  42.3   Platelets 150 - 400 K/uL 205  183  169       Latest Ref Rng & Units 06/22/2022   12:04 PM 09/07/2021    3:01 PM 07/17/2021    4:00 PM  CMP  Glucose 70 - 99 mg/dL 81  78  119   BUN 6 - 20 mg/dL 20  14  19    Creatinine 0.61 - 1.24 mg/dL 0.86  0.89  0.86   Sodium 135 - 145 mmol/L 138  143  136   Potassium 3.5 - 5.1 mmol/L 4.1  4.1  3.9   Chloride 98 - 111 mmol/L 103  102  104   CO2 22 - 32 mmol/L 30  26  24    Calcium 8.9 - 10.3 mg/dL 8.8  9.4  9.0   Total Protein 6.0 - 8.5 g/dL  7.8    Total Bilirubin 0.0 - 1.2 mg/dL  0.3    Alkaline Phos 44 - 121 IU/L  85    AST 0 - 40 IU/L  21    ALT 0 - 44 IU/L  21        Imaging: CLINICAL  DATA:  Cough   EXAM: CHEST - 2 VIEW   COMPARISON:  07/17/2021   FINDINGS: The heart size and mediastinal contours are within normal limits. Both lungs are clear. The visualized skeletal structures are unremarkable.   IMPRESSION: No active cardiopulmonary disease.     Electronically Signed   By: Fidela Salisbury M.D.   On: 09/12/2022 17:41  Within last 24 hrs: No results found.  Assessment     Patient Active Problem List   Diagnosis Date Noted   Prediabetes 04/14/2022   Dyspnea on exertion 04/12/2022   COPD (chronic obstructive pulmonary disease) with emphysema (HCC) 07/19/2021   Hyperlipidemia 05/05/2021   Rectal bleeding 11/15/2020   Acute bronchitis 08/30/2018   Spell of dizziness 03/14/2018   Viral upper respiratory illness 03/14/2018   Cough variant asthma 11/27/2017   Cough 06/27/2017   Rib pain 06/27/2017    Plan    Robotic repair of possible bilateral inguinal hernias, with mesh.  I discussed possibility of incarceration, strangulation, enlargement in size over time, and the need for emergency surgery in the face of these.  We reviewed that though repair of the hernias will eliminate this as a potential source of pain, that his current pain syndrome may not be related entirely to the hernia itself and also due to other forms of groin strain.  Also discussed that surgery risks include recurrence which can be up to 30% in the case of complex hernias, use of prosthetic materials (mesh) and the increased risk of infection and the possible need for re-operation and removal of mesh, possibility of post-op SBO or ileus, and the risks of general anesthetic including heart attack, stroke, sudden death or some reaction to anesthetic medications. The patient, and those present, appear to understand the risks, any and all questions were answered to the patient's satisfaction.  No guarantees were ever expressed or implied.   Face-to-face time spent with the patient and  accompanying care providers(if present) was 35 minutes, with more than 50% of the time spent counseling, educating, and coordinating care of the patient.    These notes generated with voice recognition software. I apologize for typographical errors.  Ronny Bacon M.D., FACS 10/10/2022, 11:51 AM

## 2022-10-10 NOTE — Telephone Encounter (Signed)
Patient has been advised of Pre-Admission date/time, and Surgery date at [facility]  Surgery Date: 10/25/22 Preadmission Testing Date: 10/19/22 (phone 1p-4p)  Patient has been made aware to call [phone], between 1-3:00pm the day before surgery, to find out what time to arrive for surgery.

## 2022-10-17 ENCOUNTER — Ambulatory Visit
Admission: EM | Admit: 2022-10-17 | Discharge: 2022-10-17 | Disposition: A | Discharge status: Home / Self Care | Payer: 59 | Attending: Physician Assistant | Admitting: Physician Assistant

## 2022-10-17 ENCOUNTER — Encounter: Payer: Self-pay | Admitting: Emergency Medicine

## 2022-10-17 DIAGNOSIS — E785 Hyperlipidemia, unspecified: Secondary | ICD-10-CM

## 2022-10-17 DIAGNOSIS — R0789 Other chest pain: Secondary | ICD-10-CM

## 2022-10-17 DIAGNOSIS — R0981 Nasal congestion: Secondary | ICD-10-CM | POA: Diagnosis not present

## 2022-10-17 MED ORDER — ATORVASTATIN CALCIUM 40 MG PO TABS: 40.0000 mg | ORAL_TABLET | Freq: Every day | ORAL | 2 refills | Status: DC

## 2022-10-17 NOTE — Discharge Instructions (Signed)
Advised to continue with the Lipitor 40 mg once daily to help control lipids.  Advised to follow-up with PCP to have referral organized for ENT evaluation.  Advised to continue to follow-up with specialist as directed. Return to urgent care as needed.
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
## 2022-10-19 ENCOUNTER — Encounter
Admission: RE | Admit: 2022-10-19 | Discharge: 2022-10-19 | Disposition: A | Discharge status: Home / Self Care | Payer: 59 | Source: Ambulatory Visit | Attending: Surgery | Admitting: Surgery

## 2022-10-19 ENCOUNTER — Ambulatory Visit: Payer: Self-pay | Admitting: Surgery

## 2022-10-19 DIAGNOSIS — K4020 Bilateral inguinal hernia, without obstruction or gangrene, not specified as recurrent: Secondary | ICD-10-CM

## 2022-10-19 NOTE — Patient Instructions (Addendum)
Your procedure is scheduled on: 10/25/22 - Wednesday Report to the Registration Desk on the 1st floor of the [facility]. To find out your arrival time, please call [phone] between 1PM - 3PM on: 10/24/22 - Tuesday If your arrival time is 6:00 am, do not arrive before that time as the [place] [facility] entrance doors do not open until 6:00 am.  REMEMBER: Instructions that are not followed completely may result in serious medical risk, up to and including death; or upon the discretion of your surgeon and anesthesiologist your surgery may need to be rescheduled.  Do not eat food or drink any liquids after midnight the night before surgery.  No gum chewing or hard candies.  One week prior to surgery: Stop Anti-inflammatories (NSAIDS) such as Advil, Aleve, Ibuprofen, Motrin, Naproxen, Naprosyn and Aspirin based products such as Excedrin, Goody's Powder, BC Powder.  You may take Tylenol if needed for pain up until the day of surgery.  Stop ANY OVER THE COUNTER supplements until after surgery.   Continue taking all prescribed medications with the exception of the following:  sildenafil (REVATIO) hold for 2 days prior to your surgery   TAKE ONLY THESE MEDICATIONS THE MORNING OF SURGERY WITH A SIP OF WATER:  omeprazole (PRILOSEC) - (take one the night before and one on the morning of surgery - helps to prevent nausea after surgery.) atorvastatin (LIPITOR)  ipratropium-albuterol (DUONEB )   No Alcohol for 24 hours before or after surgery.  No Smoking including e-cigarettes for 24 hours before surgery.  No chewable tobacco products for at least 6 hours before surgery.  No nicotine patches on the day of surgery.  Do not use any "recreational" drugs for at least a week (preferably 2 weeks) before your surgery.  Please be advised that the combination of cocaine and anesthesia may have negative outcomes, up to and including death. If you test positive for cocaine, your surgery will be  cancelled.  On the morning of surgery brush your teeth with toothpaste and water, you may rinse your mouth with mouthwash if you wish. Do not swallow any toothpaste or mouthwash.  Use CHG Soap or wipes as directed on instruction sheet.  Do not wear jewelry, make-up, hairpins, clips or nail polish.  Do not wear lotions, powders, or perfumes.   Do not shave body hair from the neck down 48 hours before surgery.  Contact lenses, hearing aids and dentures may not be worn into surgery.  Do not bring valuables to the hospital. [facility] is not responsible for any missing/lost belongings or valuables.   Notify your doctor if there is any change in your medical condition (cold, fever, infection).  Wear comfortable clothing (specific to your surgery type) to the hospital.  After surgery, you can help prevent lung complications by doing breathing exercises.  Take deep breaths and cough every 1-2 hours. Your doctor may order a device called an Incentive Spirometer to help you take deep breaths. When coughing or sneezing, hold a pillow firmly against your incision with both hands. This is called "splinting." Doing this helps protect your incision. It also decreases belly discomfort.  If you are being admitted to the hospital overnight, leave your suitcase in the car. After surgery it may be brought to your room.  In case of increased patient census, it may be necessary for you, the patient, to continue your postoperative care in the Same Day Surgery department.  If you are being discharged the day of surgery, you will  not be allowed to drive home. You will need a responsible individual to drive you home and stay with you for 24 hours after surgery.   If you are taking public transportation, you will need to have a responsible individual with you.  Please call the [facility] Dept. at [phone] if you have any questions about these instructions.  Surgery Visitation  Policy:  Patients having surgery or a procedure may have two visitors.  Children under the age of 34 must have an adult with them who is not the patient.  Inpatient Visitation:    Visiting hours are 7 a.m. to 8 p.m. Up to four visitors are allowed at one time in a patient room. The visitors may rotate out with other people during the day.  One visitor age 88 or older may stay with the patient overnight and must be in the room by 8 p.m.    Preparing for Surgery with CHLORHEXIDINE GLUCONATE (CHG) Soap  Chlorhexidine Gluconate (CHG) Soap  o An antiseptic cleaner that kills germs and bonds with the skin to continue killing germs even after washing  o Used for showering the night before surgery and morning of surgery  Before surgery, you can play an important role by reducing the number of germs on your skin.  CHG (Chlorhexidine gluconate) soap is an antiseptic cleanser which kills germs and bonds with the skin to continue killing germs even after washing.  Please do not use if you have an allergy to CHG or antibacterial soaps. If your skin becomes reddened/irritated stop using the CHG.  1. Shower the NIGHT BEFORE SURGERY and the MORNING OF SURGERY with CHG soap.  2. If you choose to wash your hair, wash your hair first as usual with your normal shampoo.  3. After shampooing, rinse your hair and body thoroughly to remove the shampoo.  4. Use CHG as you would any other liquid soap. You can apply CHG directly to the skin and wash gently with a scrungie or a clean washcloth.  5. Apply the CHG soap to your body only from the neck down. Do not use on open wounds or open sores. Avoid contact with your eyes, ears, mouth, and genitals (private parts). Wash face and genitals (private parts) with your normal soap.  6. Wash thoroughly, paying special attention to the area where your surgery will be performed.  7. Thoroughly rinse your body with warm water.  8. Do not shower/wash with your  normal soap after using and rinsing off the CHG soap.  9. Pat yourself dry with a clean towel.  10. Wear clean pajamas to bed the night before surgery.  12. Place clean sheets on your bed the night of your first shower and do not sleep with pets.  13. Shower again with the CHG soap on the day of surgery prior to arriving at the hospital.  14. Do not apply any deodorants/lotions/powders.  15. Please wear clean clothes to the hospital.

## 2022-10-23 ENCOUNTER — Encounter: Payer: Self-pay | Admitting: Urgent Care

## 2022-10-23 ENCOUNTER — Encounter
Admission: RE | Admit: 2022-10-23 | Discharge: 2022-10-23 | Disposition: A | Discharge status: Home / Self Care | Payer: 59 | Source: Ambulatory Visit | Attending: Surgery | Admitting: Surgery

## 2022-10-23 DIAGNOSIS — Z01818 Encounter for other preprocedural examination: Secondary | ICD-10-CM | POA: Insufficient documentation

## 2022-10-23 DIAGNOSIS — K4020 Bilateral inguinal hernia, without obstruction or gangrene, not specified as recurrent: Secondary | ICD-10-CM | POA: Diagnosis not present

## 2022-10-23 DIAGNOSIS — Z01812 Encounter for preprocedural laboratory examination: Secondary | ICD-10-CM

## 2022-10-23 LAB — COMPREHENSIVE METABOLIC PANEL
ALT: 26 U/L (ref 0–44)
AST: 29 U/L (ref 15–41)
Albumin: 3.7 g/dL (ref 3.5–5.0)
Alkaline Phosphatase: 69 U/L (ref 38–126)
Anion gap: 8 (ref 5–15)
BUN: 26 mg/dL — ABNORMAL HIGH (ref 6–20)
CO2: 26 mmol/L (ref 22–32)
Calcium: 8.8 mg/dL — ABNORMAL LOW (ref 8.9–10.3)
Chloride: 103 mmol/L (ref 98–111)
Creatinine, Ser: 1.03 mg/dL (ref 0.61–1.24)
GFR, Estimated: >60 mL/min (ref >60)
Glucose, Bld: 95 mg/dL (ref 70–99)
Potassium: 3.7 mmol/L (ref 3.5–5.1)
Sodium: 137 mmol/L (ref 135–145)
Total Bilirubin: 0.7 mg/dL (ref 0.3–1.2)
Total Protein: 8 g/dL (ref 6.5–8.1)

## 2022-10-23 LAB — CBC WITH DIFFERENTIAL/PLATELET
Abs Immature Granulocytes: 0.04 10*3/uL (ref 0.00–0.07)
Basophils Absolute: 0.1 10*3/uL (ref 0.0–0.1)
Basophils Relative: 1 %
Eosinophils Absolute: 0.5 10*3/uL (ref 0.0–0.5)
Eosinophils Relative: 5 %
HCT: 43.5 % (ref 39.0–52.0)
Hemoglobin: 14.8 g/dL (ref 13.0–17.0)
Immature Granulocytes: 0 %
Lymphocytes Relative: 19 %
Lymphs Abs: 1.9 10*3/uL (ref 0.7–4.0)
MCH: 29.9 pg (ref 26.0–34.0)
MCHC: 34 g/dL (ref 30.0–36.0)
MCV: 87.9 fL (ref 80.0–100.0)
Monocytes Absolute: 0.6 10*3/uL (ref 0.1–1.0)
Monocytes Relative: 7 %
Neutro Abs: 6.7 10*3/uL (ref 1.7–7.7)
Neutrophils Relative %: 68 %
Platelets: 195 10*3/uL (ref 150–400)
RBC: 4.95 MIL/uL (ref 4.22–5.81)
RDW: 12.8 % (ref 11.5–15.5)
WBC: 9.8 10*3/uL (ref 4.0–10.5)
nRBC: 0 % (ref 0.0–0.2)

## 2022-10-24 ENCOUNTER — Telehealth: Payer: Self-pay | Admitting: Surgery

## 2022-10-24 ENCOUNTER — Other Ambulatory Visit: Payer: BLUE CROSS/BLUE SHIELD

## 2022-10-24 NOTE — Telephone Encounter (Signed)
Patient calls again to cancel surgery last minute for scheduled robotic bilateral inguinal hernia repair.  Patient has cancelled and rescheduled multiple times with his surgery in the past.  This time said he does not have anyone to bring him or stay with him after his surgery.  Patient has been made aware of this many, many times in the past and was given adequate time at his request to arrange for someone to stay with him.  Surgery is cancelled again at patient's request.  At this time surgery will not be rescheduled.  If patient calls back to schedule follow up in office with Dr. Claudine Mouton, DO NOT schedule unless Dr. Claudine Mouton approves.

## 2022-10-24 NOTE — Telephone Encounter (Signed)
Error

## 2022-10-25 ENCOUNTER — Ambulatory Visit: Admission: RE | Admit: 2022-10-25 | Payer: 59 | Source: Home / Self Care | Admitting: Surgery

## 2022-10-25 DIAGNOSIS — Z01812 Encounter for preprocedural laboratory examination: Secondary | ICD-10-CM

## 2022-10-25 SURGERY — REPAIR, HERNIA, INGUINAL, BILATERAL, ROBOT-ASSISTED
Anesthesia: General | Laterality: Bilateral

## 2022-11-03 ENCOUNTER — Ambulatory Visit: Payer: 59

## 2022-11-03 NOTE — Progress Notes (Unsigned)
Patient ID: Jerry Steele, male    DOB: [birth year]  MRN: [id number]  CC: Follow-Up  Subjective: Jerry Steele is a 57 y.o. male who presents for follow-up.   His concerns today include:  Pulmonology  Cardiology   Patient Active Problem List   Diagnosis Date Noted   Non-recurrent bilateral inguinal hernia without obstruction or gangrene 10/10/2022   Prediabetes 04/14/2022   Dyspnea on exertion 04/12/2022   COPD (chronic obstructive pulmonary disease) with emphysema 07/19/2021   Hyperlipidemia 05/05/2021   Rectal bleeding 11/15/2020   Acute bronchitis 08/30/2018   Spell of dizziness 03/14/2018   Viral upper respiratory illness 03/14/2018   Cough variant asthma 11/27/2017   Cough 06/27/2017   Rib pain 06/27/2017     Current Outpatient Medications on File Prior to Visit  Medication Sig Dispense Refill   acetaminophen (TYLENOL) 500 MG tablet Take 1,000 mg by mouth every 6 (six) hours as needed.     atorvastatin (LIPITOR) 40 MG tablet Take 1 tablet (40 mg total) by mouth daily. 30 tablet 2   ipratropium-albuterol (DUONEB) 0.5-2.5 (3) MG/3ML SOLN Take 3 mLs by nebulization every 12 (twelve) hours as needed.     omeprazole (PRILOSEC) 20 MG capsule Take 20 mg by mouth daily.     sildenafil (REVATIO) 20 MG tablet TAKE 1 TABLET BY MOUTH ONCE DAILY 30-60  MINUTES  PRIOR  TO  INTERCOURSE  AS  NEEDED.  NO  MORE  THAN  1  TABLET  IN  24  HOURS 60 tablet 0   No current facility-administered medications on file prior to visit.    No Known Allergies  Social History   Socioeconomic History   Marital status: Divorced    Spouse name: Not on file   Number of children: Not on file   Years of education: Not on file   Highest education level: Not on file  Occupational History   Not on file  Tobacco Use   Smoking status: Former    Packs/day: 0.25    Years: 30.00    Additional pack years: 0.00    Total pack years: 7.50    Types: Cigarettes    Quit date: 12/16/2016    Years since  quitting: 5.8    Passive exposure: Never   Smokeless tobacco: Never  Vaping Use   Vaping Use: Never used  Substance and Sexual Activity   Alcohol use: No    Alcohol/week: 0.0 standard drinks of alcohol   Drug use: No   Sexual activity: Yes  Other Topics Concern   Not on file  Social History Narrative   Lives with son   Social Determinants of Health   Financial Resource Strain: Not on file  Food Insecurity: Not on file  Transportation Needs: Not on file  Physical Activity: Not on file  Stress: Not on file  Social Connections: Not on file  Intimate Partner Violence: Not on file    Family History  Problem Relation Age of Onset   Sudden Cardiac Death Brother 40   Colon cancer Neg Hx    Colon polyps Neg Hx    Esophageal cancer Neg Hx    Rectal cancer Neg Hx    Stomach cancer Neg Hx     Past Surgical History:  Procedure Laterality Date   HEMORRHOID SURGERY  1985   x2    ROS: Review of Systems Negative except as stated above  PHYSICAL EXAM: There were no vitals taken for this visit.  Physical Exam  {male  adult master:310786} {male adult master:310785}     Latest Ref Rng & Units 10/23/2022   11:46 AM 06/22/2022   12:04 PM 09/07/2021    3:01 PM  CMP  Glucose 70 - 99 mg/dL 95  81  78   BUN 6 - 20 mg/dL Creatinine 0.61 - 1.24 mg/dL 9.60  4.54  0.98   Sodium 135 - 145 mmol/L 137  138  143   Potassium 3.5 - 5.1 mmol/L 3.7  4.1  4.1   Chloride 98 - 111 mmol/L 103  103  102   CO2 22 - 32 mmol/L Calcium 8.9 - 10.3 mg/dL 8.8  8.8  9.4   Total Protein 6.5 - 8.1 g/dL 8.0   7.8   Total Bilirubin 0.3 - 1.2 mg/dL 0.7   0.3   Alkaline Phos 38 - 126 U/L 69   85   AST 15 - 41 U/L 29   21   ALT 0 - 44 U/L 26   21    Lipid Panel     Component Value Date/Time   CHOL 148 04/13/2022 0837   TRIG 111 04/13/2022 0837   HDL 47 04/13/2022 0837   CHOLHDL 3.1 04/13/2022 0837   CHOLHDL 5.2 (H) 03/31/2016 1417   VLDL 42 (H) 03/31/2016 1417   LDLCALC  81 04/13/2022 0837    CBC    Component Value Date/Time   WBC 9.8 10/23/2022 1146   RBC 4.95 10/23/2022 1146   HGB 14.8 10/23/2022 1146   HGB 14.5 09/07/2021 1501   HCT 43.5 10/23/2022 1146   HCT 43.7 09/07/2021 1501   PLT 195 10/23/2022 1146   PLT 183 09/07/2021 1501   MCV 87.9 10/23/2022 1146   MCV 90 09/07/2021 1501   MCH 29.9 10/23/2022 1146   MCHC 34.0 10/23/2022 1146   RDW 12.8 10/23/2022 1146   RDW 13.0 09/07/2021 1501   LYMPHSABS 1.9 10/23/2022 1146   LYMPHSABS 1.9 11/11/2020 1842   MONOABS 0.6 10/23/2022 1146   EOSABS 0.5 10/23/2022 1146   EOSABS 0.3 11/11/2020 1842   BASOSABS 0.1 10/23/2022 1146   BASOSABS 0.1 11/11/2020 1842    ASSESSMENT AND PLAN:  There are no diagnoses linked to this encounter.   Patient was given the opportunity to ask questions.  Patient verbalized understanding of the plan and was able to repeat key elements of the plan. Patient was given clear instructions to go to Emergency Department or return to medical center if symptoms don't improve, worsen, or new problems develop.The patient verbalized understanding.   No orders of the defined types were placed in this encounter.    Requested Prescriptions    No prescriptions requested or ordered in this encounter    No follow-ups on file.  Rema Fendt, NP

## 2022-11-07 ENCOUNTER — Ambulatory Visit (INDEPENDENT_AMBULATORY_CARE_PROVIDER_SITE_OTHER): Payer: 59 | Admitting: Family

## 2022-11-07 ENCOUNTER — Encounter: Payer: Self-pay | Admitting: Family

## 2022-11-07 VITALS — BP 133/83 | HR 65 | Temp 98.4°F | Resp 20 | Ht 75.0 in | Wt 192.0 lb

## 2022-11-07 DIAGNOSIS — K219 Gastro-esophageal reflux disease without esophagitis: Secondary | ICD-10-CM | POA: Diagnosis not present

## 2022-11-07 DIAGNOSIS — R0981 Nasal congestion: Secondary | ICD-10-CM | POA: Diagnosis not present

## 2022-11-07 DIAGNOSIS — N529 Male erectile dysfunction, unspecified: Secondary | ICD-10-CM | POA: Diagnosis not present

## 2022-11-07 MED ORDER — SILDENAFIL CITRATE 20 MG PO TABS
ORAL_TABLET | ORAL | 0 refills | Status: DC
Start: 2022-11-07 — End: 2022-11-22

## 2022-11-07 MED ORDER — OMEPRAZOLE 20 MG PO CPDR
20.0000 mg | DELAYED_RELEASE_CAPSULE | Freq: Two times a day (BID) | ORAL | 1 refills | Status: DC
Start: 2022-11-07 — End: 2023-11-13

## 2022-11-07 NOTE — Progress Notes (Signed)
Abdominal discomfort- hernia. Has cancelled surgery 3 times.    Feels tightness in chest. Hears himself wheezing  States he thinks that he has infection on nose from previous incident when he was poked in nose  Refill atorvastatin and sildenafil Omeprazole does not help with reflux. Feels like he need to vomit at times

## 2022-11-22 ENCOUNTER — Ambulatory Visit: Payer: 59 | Admitting: Urology

## 2022-11-22 ENCOUNTER — Encounter: Payer: Self-pay | Admitting: Urology

## 2022-11-22 VITALS — BP 98/67 | HR 76 | Ht 75.0 in | Wt 190.8 lb

## 2022-11-22 DIAGNOSIS — N529 Male erectile dysfunction, unspecified: Secondary | ICD-10-CM | POA: Diagnosis not present

## 2022-11-22 MED ORDER — SILDENAFIL CITRATE 20 MG PO TABS: ORAL_TABLET | ORAL | 11 refills | Status: DC

## 2022-11-22 NOTE — Progress Notes (Addendum)
Assessment: 1. Erectile dysfunction of organic origin      Plan: Patient would like ongoing continued yearly follow-up and medical management of his ED. New prescription provided for sildenafil 20 mg-2-5 tabs p.o. 1 to 2 hours prior to sexual activity on an empty stomach.  Follow-up 1 year.  PSA and DRE on follow-up  Chief Complaint: ED  History of Present Illness:  Jerry Steele is a 57 y.o. male who is seen in consultation from Rema Fendt, NP for evaluation of ED. patient has a long standing erectile dysfunction and is actually been seen at [facility] a year ago.  He was started on sildenafil 20 mg and typically uses 3 with good results.  He states that his primary care physician suggest that he have ongoing urologic follow-up for medical management of his ED.  Patient also had a normal PSA last year of 0.7 as well as a normal testosterone.  Patient reports mild to moderate stable lower urinary tract symptoms. Current IPSS = 8/2   Past Medical History:  Past Medical History:  Diagnosis Date   Anginal pain (HCC)    Arthritis of both hands    Asthma    COPD (chronic obstructive pulmonary disease) (HCC)    Dyspnea    Emphysema lung (HCC)    Former smoker    GERD (gastroesophageal reflux disease)    Hyperlipidemia    Pneumonia    Pre-diabetes    Prostate cancer (HCC)     Past Surgical History:  Past Surgical History:  Procedure Laterality Date   HEMORRHOID SURGERY  1985   x2    Allergies:  No Known Allergies  Family History:  Family History  Problem Relation Age of Onset   Sudden Cardiac Death Brother 57   Colon cancer Neg Hx    Colon polyps Neg Hx    Esophageal cancer Neg Hx    Rectal cancer Neg Hx    Stomach cancer Neg Hx     Social History:  Social History   Tobacco Use   Smoking status: Former    Packs/day: 0.25    Years: 30.00    Additional pack years: 0.00    Total pack years: 7.50    Types: Cigarettes    Quit date:  12/16/2016    Years since quitting: 5.9    Passive exposure: Never   Smokeless tobacco: Never  Vaping Use   Vaping Use: Never used  Substance Use Topics   Alcohol use: No    Alcohol/week: 0.0 standard drinks of alcohol   Drug use: No    Review of symptoms:  Constitutional:  Negative for unexplained weight loss, night sweats, fever, chills ENT:  Negative for nose bleeds, sinus pain, painful swallowing CV:  Negative for chest pain, shortness of breath, exercise intolerance, palpitations, loss of consciousness Resp:  Negative for cough, wheezing, shortness of breath GI:  Negative for nausea, vomiting, diarrhea, bloody stools GU:  Positives noted in HPI; otherwise negative for gross hematuria, dysuria, urinary incontinence Neuro:  Negative for seizures, poor balance, limb weakness, slurred speech Psych:  Negative for lack of energy, depression, anxiety Endocrine:  Negative for polydipsia, polyuria, symptoms of hypoglycemia (dizziness, hunger, sweating) Hematologic:  Negative for anemia, purpura, petechia, prolonged or excessive bleeding, use of anticoagulants  Allergic:  Negative for difficulty breathing or choking as a result of exposure to anything; no shellfish allergy; no allergic response (rash/itch) to materials, foods  Physical exam: BP 98/67   Pulse 76   Ht  6\' 3"  (1.905 m)   Wt 190 lb 12.8 oz (86.5 kg)   BMI 23.85 kg/m  GENERAL APPEARANCE:  Well appearing, well developed, well nourished, NAD

## 2023-01-18 ENCOUNTER — Other Ambulatory Visit: Payer: Self-pay | Admitting: Urology

## 2023-01-18 DIAGNOSIS — Z87891 Personal history of nicotine dependence: Secondary | ICD-10-CM | POA: Insufficient documentation

## 2023-01-22 DIAGNOSIS — J342 Deviated nasal septum: Secondary | ICD-10-CM | POA: Insufficient documentation

## 2023-01-22 NOTE — Telephone Encounter (Signed)
Left msg for patient that Rx was sent in with refills for one year. For additional questions/concerns, call office at [phone].

## 2023-01-24 ENCOUNTER — Ambulatory Visit (INDEPENDENT_AMBULATORY_CARE_PROVIDER_SITE_OTHER): Payer: 59 | Admitting: Family

## 2023-01-24 ENCOUNTER — Encounter: Payer: Self-pay | Admitting: Family

## 2023-01-24 VITALS — BP 125/85 | Temp 97.6°F | Ht 75.0 in | Wt 188.8 lb

## 2023-01-24 DIAGNOSIS — R21 Rash and other nonspecific skin eruption: Secondary | ICD-10-CM | POA: Diagnosis not present

## 2023-01-24 DIAGNOSIS — R238 Other skin changes: Secondary | ICD-10-CM

## 2023-01-24 MED ORDER — TRIAMCINOLONE ACETONIDE 0.025 % EX OINT
1.0000 | TOPICAL_OINTMENT | Freq: Two times a day (BID) | CUTANEOUS | 0 refills | Status: AC
Start: 2023-01-24 — End: ?

## 2023-01-24 NOTE — Progress Notes (Signed)
Pt states something going on pulmonary and worried about infection. Pt states sometimes he can't breath due to all of this. Pain in lower back, chest, and lungs.

## 2023-01-24 NOTE — Progress Notes (Signed)
Patient ID: Jerry Steele, male    DOB: 03/09/1966  MRN: [id number]  CC: Rash  Subjective: Jerry Steele is a 57 y.o. male who presents for rash.  His concerns today include:  - Rash of left upper extremity. Reports rash itching and sometimes face itching as well. Reports he noticed "brown spots" of left upper extremity and left flank 3 days ago. He denies associated symptoms.  - Reports he was recently seen at Pulmonology 3 days ago. States he is waiting for them to call him with lab results.  - No further issues/concerns for discussion today.    Patient Active Problem List   Diagnosis Date Noted   Non-recurrent bilateral inguinal hernia without obstruction or gangrene 10/10/2022   Prediabetes 04/14/2022   Dyspnea on exertion 04/12/2022   COPD (chronic obstructive pulmonary disease) with emphysema (HCC) 07/19/2021   Hyperlipidemia 05/05/2021   Rectal bleeding 11/15/2020   Acute bronchitis 08/30/2018   Spell of dizziness 03/14/2018   Viral upper respiratory illness 03/14/2018   Cough variant asthma 11/27/2017   Cough 06/27/2017   Rib pain 06/27/2017     Current Outpatient Medications on File Prior to Visit  Medication Sig Dispense Refill   atorvastatin (LIPITOR) 40 MG tablet Take 1 tablet (40 mg total) by mouth daily. 30 tablet 2   ipratropium-albuterol (DUONEB) 0.5-2.5 (3) MG/3ML SOLN Take 3 mLs by nebulization every 12 (twelve) hours as needed.     omeprazole (PRILOSEC) 20 MG capsule Take 1 capsule (20 mg total) by mouth 2 (two) times daily before a meal. 60 capsule 1   sildenafil (REVATIO) 20 MG tablet Take 2-5 tabs p.o. 1 to 2 hours prior to sexual activity on an empty stomach 20 tablet 11   acetaminophen (TYLENOL) 500 MG tablet Take 1,000 mg by mouth every 6 (six) hours as needed. (Patient not taking: Reported on 01/24/2023)     No current facility-administered medications on file prior to visit.    No Known Allergies  Social History   Socioeconomic History    Marital status: Divorced    Spouse name: Not on file   Number of children: Not on file   Years of education: Not on file   Highest education level: Not on file  Occupational History   Not on file  Tobacco Use   Smoking status: Former    Packs/day: 0.25    Years: 30.00    Additional pack years: 0.00    Total pack years: 7.50    Types: Cigarettes    Quit date: 12/16/2016    Years since quitting: 6.1    Passive exposure: Never   Smokeless tobacco: Never  Vaping Use   Vaping Use: Never used  Substance and Sexual Activity   Alcohol use: No    Alcohol/week: 0.0 standard drinks of alcohol   Drug use: No   Sexual activity: Yes  Other Topics Concern   Not on file  Social History Narrative   Lives with son   Social Determinants of Health   Financial Resource Strain: Not on file  Food Insecurity: Not on file  Transportation Needs: Not on file  Physical Activity: Not on file  Stress: Not on file  Social Connections: Not on file  Intimate Partner Violence: Not on file    Family History  Problem Relation Age of Onset   Sudden Cardiac Death Brother 46   Colon cancer Neg Hx    Colon polyps Neg Hx    Esophageal cancer Neg Hx  Rectal cancer Neg Hx    Stomach cancer Neg Hx     Past Surgical History:  Procedure Laterality Date   HEMORRHOID SURGERY  1985   x2    ROS: Review of Systems Negative except as stated above  PHYSICAL EXAM: BP 125/85   Temp 97.6 F (36.4 C) (Oral)   Ht 6\' 3"  (1.905 m)   Wt 188 lb 12.8 oz (85.6 kg)   SpO2 92%   BMI 23.60 kg/m   Physical Exam HENT:     Head: Normocephalic and atraumatic.     Nose: Nose normal.     Mouth/Throat:     Mouth: Mucous membranes are moist.     Pharynx: Oropharynx is clear.  Eyes:     Extraocular Movements: Extraocular movements intact.     Conjunctiva/sclera: Conjunctivae normal.     Pupils: Pupils are equal, round, and reactive to light.  Cardiovascular:     Rate and Rhythm: Normal rate and regular  rhythm.     Pulses: Normal pulses.     Heart sounds: Normal heart sounds.  Pulmonary:     Effort: Pulmonary effort is normal.     Breath sounds: Normal breath sounds.  Musculoskeletal:     Right shoulder: Normal.     Left shoulder: Normal.     Right upper arm: Normal.     Left upper arm: Normal.     Right elbow: Normal.     Left elbow: Normal.     Right forearm: Normal.     Left forearm: Normal.     Right wrist: Normal.     Left wrist: Normal.     Right hand: Normal.     Left hand: Normal.     Cervical back: Normal, normal range of motion and neck supple.     Thoracic back: Normal.     Lumbar back: Normal.     Right hip: Normal.     Left hip: Normal.     Right upper leg: Normal.     Left upper leg: Normal.     Right knee: Normal.     Left knee: Normal.     Right lower leg: Normal.     Left lower leg: Normal.     Right ankle: Normal.     Left ankle: Normal.     Right foot: Normal.     Left foot: Normal.  Skin:    General: Skin is warm and dry.     Findings: Rash present.     Comments: Erythematous rash left upper extremity, no drainage, no additional presentation. Brown-orange macular skin spot of left upper extremity and left flank, no drainage, no additional presentation.   Neurological:     General: No focal deficit present.     Mental Status: He is alert and oriented to person, place, and time.  Psychiatric:        Mood and Affect: Mood normal.        Behavior: Behavior normal.      ASSESSMENT AND PLAN: 1. Rash and nonspecific skin eruption - Triamcinolone ointment as prescribed. Counseled on medication adherence.  - Referral to Dermatology for further evaluation/management. During the interim patient to follow-up with primary provider as scheduled until established with referral.  - Ambulatory referral to Dermatology - triamcinolone (KENALOG) 0.025 % ointment; Apply 1 Application topically 2 (two) times daily.  Dispense: 60 g; Refill: 0  2. Abnormal skin  color - Referral to Dermatology for further evaluation/management. During the interim patient to  follow-up with primary provider as scheduled until established with referral.  - Ambulatory referral to Dermatology   Patient was given the opportunity to ask questions.  Patient verbalized understanding of the plan and was able to repeat key elements of the plan. Patient was given clear instructions to go to Emergency Department or return to medical center if symptoms don't improve, worsen, or new problems develop.The patient verbalized understanding.   Orders Placed This Encounter  Procedures   Ambulatory referral to Dermatology     Requested Prescriptions   Signed Prescriptions Disp Refills   triamcinolone (KENALOG) 0.025 % ointment 60 g 0    Sig: Apply 1 Application topically 2 (two) times daily.    Follow-up with primary provider as scheduled.   Rema Fendt, NP

## 2023-02-16 ENCOUNTER — Other Ambulatory Visit: Payer: Self-pay | Admitting: Urology

## 2023-03-02 ENCOUNTER — Other Ambulatory Visit: Payer: Self-pay

## 2023-03-02 ENCOUNTER — Emergency Department (HOSPITAL_COMMUNITY)
Admission: EM | Admit: 2023-03-02 | Discharge: 2023-03-02 | Disposition: A | Discharge status: Home / Self Care | Payer: 59 | Attending: Emergency Medicine | Admitting: Emergency Medicine

## 2023-03-02 ENCOUNTER — Emergency Department (HOSPITAL_COMMUNITY): Payer: 59

## 2023-03-02 ENCOUNTER — Encounter (HOSPITAL_COMMUNITY): Payer: Self-pay

## 2023-03-02 DIAGNOSIS — J069 Acute upper respiratory infection, unspecified: Secondary | ICD-10-CM

## 2023-03-02 DIAGNOSIS — R0789 Other chest pain: Secondary | ICD-10-CM | POA: Diagnosis not present

## 2023-03-02 DIAGNOSIS — R059 Cough, unspecified: Secondary | ICD-10-CM | POA: Diagnosis not present

## 2023-03-02 DIAGNOSIS — Z20822 Contact with and (suspected) exposure to covid-19: Secondary | ICD-10-CM | POA: Insufficient documentation

## 2023-03-02 DIAGNOSIS — R519 Headache, unspecified: Secondary | ICD-10-CM | POA: Diagnosis not present

## 2023-03-02 DIAGNOSIS — J449 Chronic obstructive pulmonary disease, unspecified: Secondary | ICD-10-CM | POA: Diagnosis not present

## 2023-03-02 DIAGNOSIS — R5383 Other fatigue: Secondary | ICD-10-CM | POA: Insufficient documentation

## 2023-03-02 LAB — CBC WITH DIFFERENTIAL/PLATELET
Abs Immature Granulocytes: 0.03 10*3/uL (ref 0.00–0.07)
Basophils Absolute: 0.1 10*3/uL (ref 0.0–0.1)
Basophils Relative: 1 %
Eosinophils Absolute: 0.4 10*3/uL (ref 0.0–0.5)
Eosinophils Relative: 5 %
HCT: 47 % (ref 39.0–52.0)
Hemoglobin: 16.1 g/dL (ref 13.0–17.0)
Immature Granulocytes: 0 %
Lymphocytes Relative: 25 %
Lymphs Abs: 2.2 10*3/uL (ref 0.7–4.0)
MCH: 30.6 pg (ref 26.0–34.0)
MCHC: 34.3 g/dL (ref 30.0–36.0)
MCV: 89.2 fL (ref 80.0–100.0)
Monocytes Absolute: 0.5 10*3/uL (ref 0.1–1.0)
Monocytes Relative: 6 %
Neutro Abs: 5.5 10*3/uL (ref 1.7–7.7)
Neutrophils Relative %: 63 %
Platelets: 201 10*3/uL (ref 150–400)
RBC: 5.27 MIL/uL (ref 4.22–5.81)
RDW: 12.5 % (ref 11.5–15.5)
WBC: 8.7 10*3/uL (ref 4.0–10.5)
nRBC: 0 % (ref 0.0–0.2)

## 2023-03-02 LAB — RESP PANEL BY RT-PCR (RSV, FLU A&B, COVID)  RVPGX2
Influenza A by PCR: NEGATIVE
Influenza B by PCR: NEGATIVE
Resp Syncytial Virus by PCR: NEGATIVE
SARS Coronavirus 2 by RT PCR: NEGATIVE

## 2023-03-02 LAB — COMPREHENSIVE METABOLIC PANEL
ALT: 23 U/L (ref 0–44)
AST: 19 U/L (ref 15–41)
Albumin: 4 g/dL (ref 3.5–5.0)
Alkaline Phosphatase: 82 U/L (ref 38–126)
Anion gap: 10 (ref 5–15)
BUN: 20 mg/dL (ref 6–20)
CO2: 27 mmol/L (ref 22–32)
Calcium: 9.4 mg/dL (ref 8.9–10.3)
Chloride: 101 mmol/L (ref 98–111)
Creatinine, Ser: 0.72 mg/dL (ref 0.61–1.24)
GFR, Estimated: >60 mL/min (ref >60)
Glucose, Bld: 125 mg/dL — ABNORMAL HIGH (ref 70–99)
Potassium: 3.5 mmol/L (ref 3.5–5.1)
Sodium: 138 mmol/L (ref 135–145)
Total Bilirubin: 0.5 mg/dL (ref 0.3–1.2)
Total Protein: 8.7 g/dL — ABNORMAL HIGH (ref 6.5–8.1)

## 2023-03-02 MED ORDER — PREDNISONE 50 MG PO TABS: 50.0000 mg | ORAL_TABLET | Freq: Every day | ORAL | 0 refills | Status: DC

## 2023-03-02 MED ORDER — IBUPROFEN 600 MG PO TABS: 600.0000 mg | ORAL_TABLET | Freq: Three times a day (TID) | ORAL | 0 refills | Status: AC | PRN

## 2023-03-02 MED ORDER — BENZONATATE 100 MG PO CAPS: 100.0000 mg | ORAL_CAPSULE | Freq: Three times a day (TID) | ORAL | 0 refills | Status: DC

## 2023-03-02 MED ORDER — ALBUTEROL SULFATE HFA 108 (90 BASE) MCG/ACT IN AERS: 1.0000 | INHALATION_SPRAY | Freq: Four times a day (QID) | RESPIRATORY_TRACT | 0 refills | Status: DC | PRN

## 2023-03-02 NOTE — ED Provider Notes (Signed)
[facility] Provider Note   CSN: [id number] Arrival date & time: 03/02/23  1907     History  Chief complaint: Cough  Jerry Steele is a 57 y.o. male.  HPI   Patient has a history of COPD bronchitis hyperlipidemia.  Patient presents to the ED with complaints of persistent cough over the last several days.  He has also been feeling somewhat short of breath.  Patient has been using his inhalers at home.  Patient's had some headache and fatigue.  He is having pain in the right side of his chest mostly in the back.  It increases with coughing and breathing.  Patient has not been sleeping well at night because he has been constantly coughing.  No complaints of abdominal pain.  No urinary symptoms.  No leg swelling.  Home Medications Prior to Admission medications   Medication Sig Start Date End Date Taking? Authorizing Provider  acetaminophen (TYLENOL) 500 MG tablet Take 1,000 mg by mouth every 6 (six) hours as needed. Patient not taking: Reported on 01/24/2023    [provider]  atorvastatin (LIPITOR) 40 MG tablet Take 1 tablet (40 mg total) by mouth daily. 10/17/22 01/24/23  Ellsworth Lennox, PA-C  ipratropium-albuterol (DUONEB) 0.5-2.5 (3) MG/3ML SOLN Take 3 mLs by nebulization every 12 (twelve) hours as needed.    [provider]  omeprazole (PRILOSEC) 20 MG capsule Take 1 capsule (20 mg total) by mouth 2 (two) times daily before a meal. 11/07/22   Zonia Kief, Amy J, NP  sildenafil (REVATIO) 20 MG tablet TAKE 2 TO 5 TABLETS BY MOUTH 1 TO 2 HOURS PRIOR TO SEXUAL ACTIVITY ON AN EMPTY STOMACH 02/18/23   Joline Maxcy, MD  triamcinolone (KENALOG) 0.025 % ointment Apply 1 Application topically 2 (two) times daily. 01/24/23   Rema Fendt, NP      Allergies    Patient has no known allergies.    Review of Systems   Review of Systems  Physical Exam Updated Vital Signs BP (!) 140/83 (BP Location: Left Arm)   Pulse 76   Temp 97.9  F (36.6 C) (Oral)   Resp 18   Ht 1.905 m (6\' 3" )   Wt 86.2 kg   SpO2 97%   BMI 23.75 kg/m  Physical Exam Vitals and nursing note reviewed.  Constitutional:      General: He is not in acute distress.    Appearance: He is well-developed.  HENT:     Head: Normocephalic and atraumatic.     Right Ear: External ear normal.     Left Ear: External ear normal.  Eyes:     General: No scleral icterus.       Right eye: No discharge.        Left eye: No discharge.     Conjunctiva/sclera: Conjunctivae normal.  Neck:     Trachea: No tracheal deviation.  Cardiovascular:     Rate and Rhythm: Normal rate and regular rhythm.  Pulmonary:     Effort: Pulmonary effort is normal. No respiratory distress.     Breath sounds: Normal breath sounds. No stridor. No wheezing or rales.  Abdominal:     General: Bowel sounds are normal. There is no distension.     Palpations: Abdomen is soft.     Tenderness: There is no abdominal tenderness. There is no guarding or rebound.  Musculoskeletal:        General: No tenderness or deformity.     Cervical back:  Neck supple.  Skin:    General: Skin is warm and dry.     Findings: No rash.  Neurological:     General: No focal deficit present.     Mental Status: He is alert.     Cranial Nerves: No cranial nerve deficit, dysarthria or facial asymmetry.     Sensory: No sensory deficit.     Motor: No abnormal muscle tone or seizure activity.     Coordination: Coordination normal.  Psychiatric:        Mood and Affect: Mood normal.     ED Results / Procedures / Treatments   Labs (all labs ordered are listed, but only abnormal results are displayed) Labs Reviewed  COMPREHENSIVE METABOLIC PANEL - Abnormal; Notable for the following components:      Result Value   Glucose, Bld 125 (*)    Total Protein 8.7 (*)    All other components within normal limits  RESP PANEL BY RT-PCR (RSV, FLU A&B, COVID)  [id number]  CBC WITH DIFFERENTIAL/PLATELET    EKG EKG  Interpretation Date/Time:  Friday March 02 2023 20:10:15 EDT Ventricular Rate:  73 PR Interval:  152 QRS Duration:  108 QT Interval:  369 QTC Calculation: 407 R Axis:   97  Text Interpretation: Sinus rhythm Probable left atrial enlargement Borderline right axis deviation RSR' in V1 or V2, probably normal variant Abnormal lateral Q waves No significant change since last tracing Confirmed by Linwood Dibbles [phone]) on 03/02/2023 8:13:51 PM  Radiology No results found.  Procedures Procedures    Medications Ordered in ED Medications - No data to display  ED Course/ Medical Decision Making/ A&P Clinical Course as of 03/02/23 2335  [facility] Mar 02, 2023  2253 Labs reviewed.  CBC normal.  Metabolic panel normal.  Covid flu RSV negative. [JK]  2322 Chest x-ray without acute findings [JK]    Clinical Course User Index [JK] Linwood Dibbles, MD                                 Medical Decision Making DDX includes, pna, ptx, covid, asthma exacerbation  Amount and/or Complexity of Data Reviewed Radiology: ordered and independent interpretation performed.  Risk Prescription drug management.   Patient presented to ED with complaints of persistent cough right sided chest discomfort.  ED workup reassuring.  Patient without evidence of pneumonia or pneumothorax on x-ray.  EKG not suggestive of cardiac ischemia.  COVID-negative.  On exam patient's not had any wheezing or respiratory difficulty.  Does have history of bronchospasm.  Will try him on a course of steroids inhalers cough medications to help with the symptoms.  Discussed outpatient follow-up with PCP to be rechecked  Evaluation and diagnostic testing in the emergency department does not suggest an emergent condition requiring admission or immediate intervention beyond what has been performed at this time.  The patient is safe for discharge and has been instructed to return immediately for worsening symptoms, change in symptoms or any other  concerns.        Final Clinical Impression(s) / ED Diagnoses Final diagnoses:  None    Rx / DC Orders ED Discharge Orders     None         Linwood Dibbles, MD 03/02/23 2336

## 2023-03-02 NOTE — ED Triage Notes (Signed)
Pt. Arrives POV c/o a cough. Pt. States that for a while he has had a cough and lung pain. He has also had a headache, increased fatigue, and R. Flank pain.

## 2023-03-02 NOTE — Discharge Instructions (Signed)
Take the medications as prescribed to help with your cough and pain.  Follow up with your doctor to be rechecked.

## 2023-05-01 ENCOUNTER — Encounter: Payer: Self-pay | Admitting: Surgery

## 2023-05-01 ENCOUNTER — Ambulatory Visit: Payer: 59 | Admitting: Surgery

## 2023-05-01 VITALS — BP 123/78 | HR 67 | Temp 97.6°F | Ht 75.0 in | Wt 185.6 lb

## 2023-05-01 DIAGNOSIS — K4020 Bilateral inguinal hernia, without obstruction or gangrene, not specified as recurrent: Secondary | ICD-10-CM

## 2023-05-01 NOTE — Progress Notes (Signed)
Discharged from practice

## 2023-05-01 NOTE — Patient Instructions (Signed)
Inguinal Hernia, Adult An inguinal hernia is when fat or your intestines push through a weak spot in a muscle where your leg meets your lower belly (groin). This causes a bulge. This kind of hernia could also be: In your scrotum, if you are male. In folds of skin around your vagina, if you are male. There are three types of inguinal hernias: Hernias that can be pushed back into the belly (are reducible). This type rarely causes pain. Hernias that cannot be pushed back into the belly (are incarcerated). Hernias that cannot be pushed back into the belly and lose their blood supply (are strangulated). This type needs emergency surgery. What are the causes? This condition is caused by having a weak spot in the muscles or tissues in your groin. This develops over time. The hernia may poke through the weak spot when you strain your lower belly muscles all of a sudden, such as when you: Lift a heavy object. Strain to poop (have a bowel movement). Trouble pooping (constipation) can lead to straining. Cough. What increases the risk? This condition is more likely to develop in: Males. Pregnant females. People who: Are overweight. Work in jobs that require long periods of standing or heavy lifting. Have had an inguinal hernia before. Smoke or have lung disease. These factors can lead to long-term (chronic) coughing. What are the signs or symptoms? Symptoms may depend on the size of the hernia. Often, a small hernia has no symptoms. Symptoms of a larger hernia may include: A bulge in the groin area. This is easier to see when standing. You might not be able to see it when you are lying down. Pain or burning in the groin. This may get worse when you lift, strain, or cough. A dull ache or a feeling of pressure in the groin. An abnormal bulge in the scrotum, in males. Symptoms of a strangulated inguinal hernia may include: A bulge in your groin that is very painful and tender to the touch. A bulge  that turns red or purple. Fever, feeling like you may vomit (nausea), and vomiting. Not being able to poop or to pass gas. How is this treated? Treatment depends on the size of your hernia and whether you have symptoms. If you do not have symptoms, your doctor may have you watch your hernia carefully and have you come in for follow-up visits. If your hernia is large or if you have symptoms, you may need surgery to repair the hernia. Follow these instructions at home: Lifestyle Avoid lifting heavy objects. Avoid standing for long amounts of time. Do not smoke or use any products that contain nicotine or tobacco. If you need help quitting, ask your doctor. Stay at a healthy weight. Prevent trouble pooping You may need to take these actions to prevent or treat trouble pooping: Drink enough fluid to keep your pee (urine) pale yellow. Take over-the-counter or prescription medicines. Eat foods that are high in fiber. These include beans, whole grains, and fresh fruits and vegetables. Limit foods that are high in fat and sugar. These include fried or sweet foods. General instructions You may try to push your hernia back in place by very gently pressing on it when you are lying down. Do not try to push the bulge back in if it will not go in easily. Watch your hernia for any changes in shape, size, or color. Tell your doctor if you see any changes. Take over-the-counter and prescription medicines only as told by your doctor. Keep  all follow-up visits. Contact a doctor if: You have a fever or chills. You have new symptoms. Your symptoms get worse. Get help right away if: You have pain in your groin that gets worse all of a sudden. You have a bulge in your groin that: Gets bigger all of a sudden, and it does not get smaller after that. Turns red or purple. Is painful when you touch it. You are a male, and you have: Sudden pain in your scrotum. A sudden change in the size of your scrotum. You  cannot push the hernia back in place by very gently pressing on it when you are lying down. You feel like you may vomit, and that feeling does not go away. You keep vomiting. You have a fast heartbeat. You cannot poop or pass gas. These symptoms may be an emergency. Get help right away. Call your local emergency services (911 in the [place] Do not wait to see if the symptoms will go away. Do not drive yourself to the hospital. Summary An inguinal hernia is when fat or your intestines push through a weak spot in a muscle where your leg meets your lower belly (groin). This causes a bulge. If you do not have symptoms, you may not need treatment. If you have symptoms or a large hernia, you may need surgery. Avoid lifting heavy objects. Also, avoid standing for long amounts of time. Do not try to push the bulge back in if it will not go in easily. This information is not intended to replace advice given to you by your health care provider. Make sure you discuss any questions you have with your health care provider. Document Revised: 03/02/2020 Document Reviewed: 03/02/2020 Elsevier Patient Education  2024 [employer].

## 2023-05-03 ENCOUNTER — Ambulatory Visit (INDEPENDENT_AMBULATORY_CARE_PROVIDER_SITE_OTHER): Payer: 59 | Admitting: Family

## 2023-05-03 ENCOUNTER — Encounter: Payer: Self-pay | Admitting: Family

## 2023-05-03 VITALS — BP 139/83 | HR 70 | Temp 97.5°F | Ht 75.0 in | Wt 186.8 lb

## 2023-05-03 DIAGNOSIS — K4090 Unilateral inguinal hernia, without obstruction or gangrene, not specified as recurrent: Secondary | ICD-10-CM

## 2023-05-03 DIAGNOSIS — R059 Cough, unspecified: Secondary | ICD-10-CM | POA: Diagnosis not present

## 2023-05-03 DIAGNOSIS — Z23 Encounter for immunization: Secondary | ICD-10-CM | POA: Diagnosis not present

## 2023-05-03 MED ORDER — PREDNISONE 10 MG PO TABS
ORAL_TABLET | ORAL | 0 refills | Status: AC
Start: 2023-05-03 — End: 2023-05-09

## 2023-05-03 MED ORDER — CETIRIZINE HCL 10 MG PO TABS
10.0000 mg | ORAL_TABLET | Freq: Every day | ORAL | 1 refills | Status: AC
Start: 2023-05-03 — End: ?

## 2023-05-03 NOTE — Progress Notes (Signed)
Patient ID: Jerry Steele, male    DOB: 10/31/1965  MRN: [id number]  CC: Follow-Up  Subjective: Jerry Steele is a 57 y.o. male who presents for follow-up.  His concerns today include:  - Reports nonproductive cough. He denies red flag symptoms. He declines respiratory panel. States he has an appointment with Pulmonology on tomorrow. States he is taking 3 inhalers as prescribed from Pulmonology.  - Requests new referral to General Surgery for inguinal hernia. States he had an unpleasant patient experience with previous General Surgery provider.    Patient Active Problem List   Diagnosis Date Noted   Non-recurrent bilateral inguinal hernia without obstruction or gangrene 10/10/2022   Prediabetes 04/14/2022   Dyspnea on exertion 04/12/2022   COPD (chronic obstructive pulmonary disease) with emphysema (HCC) 07/19/2021   Hyperlipidemia 05/05/2021   Rectal bleeding 11/15/2020   Acute bronchitis 08/30/2018   Spell of dizziness 03/14/2018   Viral upper respiratory illness 03/14/2018   Cough variant asthma 11/27/2017   Cough 06/27/2017   Rib pain 06/27/2017     Current Outpatient Medications on File Prior to Visit  Medication Sig Dispense Refill   acetaminophen (TYLENOL) 500 MG tablet Take 1,000 mg by mouth every 6 (six) hours as needed.     albuterol (VENTOLIN HFA) 108 (90 Base) MCG/ACT inhaler Inhale 1-2 puffs into the lungs every 6 (six) hours as needed for wheezing or shortness of breath. 6.7 g 0   benzonatate (TESSALON) 100 MG capsule Take 1 capsule (100 mg total) by mouth every 8 (eight) hours. 21 capsule 0   ibuprofen (ADVIL) 600 MG tablet Take 1 tablet (600 mg total) by mouth every 8 (eight) hours as needed. 21 tablet 0   ipratropium-albuterol (DUONEB) 0.5-2.5 (3) MG/3ML SOLN Take 3 mLs by nebulization every 12 (twelve) hours as needed.     omeprazole (PRILOSEC) 20 MG capsule Take 1 capsule (20 mg total) by mouth 2 (two) times daily before a meal. 60 capsule 1   predniSONE  (DELTASONE) 50 MG tablet Take 1 tablet (50 mg total) by mouth daily. 5 tablet 0   sildenafil (REVATIO) 20 MG tablet TAKE 2 TO 5 TABLETS BY MOUTH 1 TO 2 HOURS PRIOR TO SEXUAL ACTIVITY ON AN EMPTY STOMACH 90 tablet 3   triamcinolone (KENALOG) 0.025 % ointment Apply 1 Application topically 2 (two) times daily. 60 g 0   atorvastatin (LIPITOR) 40 MG tablet Take 1 tablet (40 mg total) by mouth daily. 30 tablet 2   No current facility-administered medications on file prior to visit.    No Known Allergies  Social History   Socioeconomic History   Marital status: Divorced    Spouse name: Not on file   Number of children: Not on file   Years of education: Not on file   Highest education level: Not on file  Occupational History   Not on file  Tobacco Use   Smoking status: Former    Current packs/day: 0.00    Average packs/day: 0.3 packs/day for 30.0 years (7.5 ttl pk-yrs)    Types: Cigarettes    Start date: 12/17/1986    Quit date: 12/16/2016    Years since quitting: 6.3    Passive exposure: Never   Smokeless tobacco: Never  Vaping Use   Vaping status: Never Used  Substance and Sexual Activity   Alcohol use: No    Alcohol/week: 0.0 standard drinks of alcohol   Drug use: No   Sexual activity: Yes  Other Topics Concern   Not  on file  Social History Narrative   Lives with son   Social Determinants of Health   Financial Resource Strain: Not on file  Food Insecurity: Not on file  Transportation Needs: Not on file  Physical Activity: Not on file  Stress: Not on file  Social Connections: Not on file  Intimate Partner Violence: Not on file    Family History  Problem Relation Age of Onset   Sudden Cardiac Death Brother 42   Colon cancer Neg Hx    Colon polyps Neg Hx    Esophageal cancer Neg Hx    Rectal cancer Neg Hx    Stomach cancer Neg Hx     Past Surgical History:  Procedure Laterality Date   HEMORRHOID SURGERY  1985   x2    ROS: Review of Systems Negative except as  stated above  PHYSICAL EXAM: BP 139/83   Pulse 70   Temp (!) 97.5 F (36.4 C) (Oral)   Ht 6\' 3"  (1.905 m)   Wt 186 lb 12.8 oz (84.7 kg)   SpO2 95%   BMI 23.35 kg/m   Physical Exam HENT:     Head: Normocephalic and atraumatic.     Nose: Nose normal.     Mouth/Throat:     Mouth: Mucous membranes are moist.     Pharynx: Oropharynx is clear.  Eyes:     Extraocular Movements: Extraocular movements intact.     Conjunctiva/sclera: Conjunctivae normal.     Pupils: Pupils are equal, round, and reactive to light.  Cardiovascular:     Rate and Rhythm: Normal rate and regular rhythm.     Pulses: Normal pulses.     Heart sounds: Normal heart sounds.  Pulmonary:     Effort: Pulmonary effort is normal.     Breath sounds: Normal breath sounds.  Musculoskeletal:        General: Normal range of motion.     Cervical back: Normal range of motion and neck supple.  Neurological:     General: No focal deficit present.     Mental Status: He is alert and oriented to person, place, and time.  Psychiatric:        Mood and Affect: Mood normal.        Behavior: Behavior normal.     ASSESSMENT AND PLAN: 1. Cough, unspecified type - Prednisone and Cetirizine as prescribed. Counseled on medication adherence/adverse effects.  - Patient declined respiratory panel. - Keep upcoming appointment with Pulmonology.  - Follow-up with primary provider as scheduled.  - predniSONE (DELTASONE) 10 MG tablet; Take 6 tablets (60 mg total) by mouth daily with breakfast for 1 day, THEN 5 tablets (50 mg total) daily with breakfast for 1 day, THEN 4 tablets (40 mg total) daily with breakfast for 1 day, THEN 3 tablets (30 mg total) daily with breakfast for 1 day, THEN 2 tablets (20 mg total) daily with breakfast for 1 day, THEN 1 tablet (10 mg total) daily with breakfast for 1 day.  Dispense: 21 tablet; Refill: 0 - cetirizine (ZYRTEC) 10 MG tablet; Take 1 tablet (10 mg total) by mouth daily.  Dispense: 30 tablet;  Refill: 1  2. Inguinal hernia without obstruction or gangrene, recurrence not specified, unspecified laterality - Referral to General Surgery for evaluation/management. - Ambulatory referral to General Surgery  3. Encounter for immunization - Administered.  - Flu vaccine trivalent PF, 6mos and older(Flulaval,Afluria,Fluarix,Fluzone)   Patient was given the opportunity to ask questions.  Patient verbalized understanding of the plan  and was able to repeat key elements of the plan. Patient was given clear instructions to go to Emergency Department or return to medical center if symptoms don't improve, worsen, or new problems develop.The patient verbalized understanding.   Orders Placed This Encounter  Procedures   Flu vaccine trivalent PF, 6mos and older(Flulaval,Afluria,Fluarix,Fluzone)   Ambulatory referral to General Surgery     Requested Prescriptions   Signed Prescriptions Disp Refills   predniSONE (DELTASONE) 10 MG tablet 21 tablet 0    Sig: Take 6 tablets (60 mg total) by mouth daily with breakfast for 1 day, THEN 5 tablets (50 mg total) daily with breakfast for 1 day, THEN 4 tablets (40 mg total) daily with breakfast for 1 day, THEN 3 tablets (30 mg total) daily with breakfast for 1 day, THEN 2 tablets (20 mg total) daily with breakfast for 1 day, THEN 1 tablet (10 mg total) daily with breakfast for 1 day.   cetirizine (ZYRTEC) 10 MG tablet 30 tablet 1    Sig: Take 1 tablet (10 mg total) by mouth daily.    Follow-up with primary provider as scheduled.   Rema Fendt, NP

## 2023-05-03 NOTE — Progress Notes (Signed)
Pt states coughing 5x continuously and he feels like he will pass out because his air is gone.   States pain in abdomen from coughing.   Pt asking for different referral since him and other suregon having issues.

## 2023-05-28 ENCOUNTER — Telehealth: Payer: Self-pay | Admitting: Family

## 2023-05-28 NOTE — Telephone Encounter (Signed)
Patient has requested to have referral sent to another place, place where it was originally sent practice dismissed patient because he didn't have anyone to help him after surgery. Patient now has someone who can help him after surgery

## 2023-06-04 ENCOUNTER — Encounter: Payer: Self-pay | Admitting: Family

## 2023-07-03 ENCOUNTER — Telehealth: Payer: Self-pay | Admitting: Family

## 2023-07-03 ENCOUNTER — Encounter: Payer: 59 | Admitting: Family

## 2023-07-03 NOTE — Progress Notes (Signed)
Erroneous encounter-disregard

## 2023-07-03 NOTE — Telephone Encounter (Signed)
Called patient to reschedule missed appointment no answer left voicemail

## 2023-07-30 ENCOUNTER — Encounter: Payer: Self-pay | Admitting: Family Medicine

## 2023-07-30 ENCOUNTER — Ambulatory Visit (INDEPENDENT_AMBULATORY_CARE_PROVIDER_SITE_OTHER): Payer: 59 | Admitting: Family Medicine

## 2023-07-30 VITALS — BP 117/82 | HR 82 | Wt 203.6 lb

## 2023-07-30 DIAGNOSIS — M792 Neuralgia and neuritis, unspecified: Secondary | ICD-10-CM | POA: Diagnosis not present

## 2023-07-30 DIAGNOSIS — R030 Elevated blood-pressure reading, without diagnosis of hypertension: Secondary | ICD-10-CM

## 2023-07-30 DIAGNOSIS — J439 Emphysema, unspecified: Secondary | ICD-10-CM | POA: Diagnosis not present

## 2023-07-30 MED ORDER — GABAPENTIN 300 MG PO CAPS: 300.0000 mg | ORAL_CAPSULE | Freq: Every day | ORAL | 3 refills | Status: DC

## 2023-07-31 ENCOUNTER — Telehealth: Payer: Self-pay | Admitting: *Deleted

## 2023-07-31 ENCOUNTER — Other Ambulatory Visit: Payer: Self-pay | Admitting: *Deleted

## 2023-07-31 DIAGNOSIS — K4020 Bilateral inguinal hernia, without obstruction or gangrene, not specified as recurrent: Secondary | ICD-10-CM

## 2023-07-31 NOTE — Telephone Encounter (Signed)
 Received call from patient [phone]~ telephone.   Patient reports that he was to be referred to [facility] for surgical evaluation of bilateral inguinal hernias. States that he was seen by [facility] but was discharged from their practice due to repeat No Shows/ cancellations.   Appointment scheduled. Referral orders placed.   Advised patient that appointments must be kept to avoid No Show/ late cancellations.

## 2023-08-01 ENCOUNTER — Other Ambulatory Visit: Payer: Self-pay | Admitting: Family

## 2023-08-01 NOTE — Telephone Encounter (Signed)
 Medication Refill -  Most Recent Primary Care Visit:  Provider: Abraham Abo  Department: [facility]  Visit Type: OFFICE VISIT  Date: 07/30/2023  Medication: ipratropium-albuterol  (DUONEB) 0.5-2.5 (3) MG/3ML SOLN   Has the patient contacted their pharmacy? Yes   Is this the correct pharmacy for this prescription? Yes If no, delete pharmacy and type the correct one.  This is the patient's preferred pharmacy:  [facility] [address] [place] - [address] [place] [address] Phone: [phone] Fax: [phone]   Has the prescription been filled recently? Yes  Is the patient out of the medication? Yes  Has the patient been seen for an appointment in the last year OR does the patient have an upcoming appointment? Yes  Can we respond through MyChart? No  Agent: Please be advised that Rx refills may take up to 3 business days. We ask that you follow-up with your pharmacy.

## 2023-08-01 NOTE — Telephone Encounter (Signed)
 Noted. Please let me know how I can further assist. Thank you.

## 2023-08-02 NOTE — Progress Notes (Signed)
Established Patient Office Visit  Subjective    Patient ID: Jerry Steele, male    DOB: 13-Apr-1966  Age: 58 y.o. MRN: [id number]  CC:  Chief Complaint  Patient presents with   COPD    Needs another pulmolgist    Numbness    In hands and finger tips    HPI [name] presents with complaint of numbness in hands and fingers and requesting a referral to pulmonary.   Outpatient Encounter Medications as of 07/30/2023  Medication Sig   gabapentin (NEURONTIN) 300 MG capsule Take 1 capsule (300 mg total) by mouth at bedtime.   acetaminophen (TYLENOL) 500 MG tablet Take 1,000 mg by mouth every 6 (six) hours as needed.   albuterol (VENTOLIN HFA) 108 (90 Base) MCG/ACT inhaler Inhale 1-2 puffs into the lungs every 6 (six) hours as needed for wheezing or shortness of breath.   atorvastatin (LIPITOR) 40 MG tablet Take 1 tablet (40 mg total) by mouth daily.   benzonatate (TESSALON) 100 MG capsule Take 1 capsule (100 mg total) by mouth every 8 (eight) hours.   cetirizine (ZYRTEC) 10 MG tablet Take 1 tablet (10 mg total) by mouth daily.   ibuprofen (ADVIL) 600 MG tablet Take 1 tablet (600 mg total) by mouth every 8 (eight) hours as needed.   ipratropium-albuterol (DUONEB) 0.5-2.5 (3) MG/3ML SOLN Take 3 mLs by nebulization every 12 (twelve) hours as needed.   omeprazole (PRILOSEC) 20 MG capsule Take 1 capsule (20 mg total) by mouth 2 (two) times daily before a meal.   predniSONE (DELTASONE) 50 MG tablet Take 1 tablet (50 mg total) by mouth daily.   sildenafil (REVATIO) 20 MG tablet TAKE 2 TO 5 TABLETS BY MOUTH 1 TO 2 HOURS PRIOR TO SEXUAL ACTIVITY ON AN EMPTY STOMACH   triamcinolone (KENALOG) 0.025 % ointment Apply 1 Application topically 2 (two) times daily.   No facility-administered encounter medications on file as of 07/30/2023.    Past Medical History:  Diagnosis Date   Anginal pain (HCC)    Arthritis of both hands    Asthma    COPD (chronic obstructive pulmonary disease) (HCC)     Dyspnea    Emphysema lung (HCC)    Former smoker    GERD (gastroesophageal reflux disease)    Hyperlipidemia    Pneumonia    Pre-diabetes    Prostate cancer (HCC)     Past Surgical History:  Procedure Laterality Date   HEMORRHOID SURGERY  1985   x2    Family History  Problem Relation Age of Onset   Sudden Cardiac Death Brother 62   Colon cancer Neg Hx    Colon polyps Neg Hx    Esophageal cancer Neg Hx    Rectal cancer Neg Hx    Stomach cancer Neg Hx     Social History   Socioeconomic History   Marital status: Divorced    Spouse name: Not on file   Number of children: Not on file   Years of education: Not on file   Highest education level: Not on file  Occupational History   Not on file  Tobacco Use   Smoking status: Former    Current packs/day: 0.00    Average packs/day: 0.3 packs/day for 30.0 years (7.5 ttl pk-yrs)    Types: Cigarettes    Start date: 12/17/1986    Quit date: 12/16/2016    Years since quitting: 6.6    Passive exposure: Never   Smokeless tobacco: Never  Vaping Use  Vaping status: Never Used  Substance and Sexual Activity   Alcohol use: No    Alcohol/week: 0.0 standard drinks of alcohol   Drug use: No   Sexual activity: Yes  Other Topics Concern   Not on file  Social History Narrative   Lives with son   Social Drivers of Corporate investment banker Strain: Not on file  Food Insecurity: Not on file  Transportation Needs: Not on file  Physical Activity: Not on file  Stress: Not on file  Social Connections: Not on file  Intimate Partner Violence: Not on file    Review of Systems  All other systems reviewed and are negative.       Objective    BP 117/82   Pulse 82   Wt 203 lb 9.6 oz (92.4 kg)   SpO2 97%   BMI 25.45 kg/m   Physical Exam Vitals and nursing note reviewed.  Constitutional:      General: He is not in acute distress. Cardiovascular:     Rate and Rhythm: Normal rate and regular rhythm.  Pulmonary:      Effort: Pulmonary effort is normal.     Breath sounds: Normal breath sounds.  Neurological:     General: No focal deficit present.     Mental Status: He is alert and oriented to person, place, and time.         Assessment & Plan:   1. Elevated blood pressure reading in office without diagnosis of hypertension (Primary)   2. Pulmonary emphysema, unspecified emphysema type [facility]) Referral for further eval/mgt - Ambulatory referral to Pulmonology  3. Neuralgia Neurontin prescribed    No follow-ups on file.   Jerry Raymond, MD

## 2023-08-02 NOTE — Telephone Encounter (Signed)
Requested medication (s) are due for refill today - unsure  Requested medication (s) are on the active medication list -yes  Future visit scheduled -yes  Last refill: unsure  Notes to clinic: listed as historical medication- sent for provider review   Requested Prescriptions  Pending Prescriptions Disp Refills   ipratropium-albuterol (DUONEB) 0.5-2.5 (3) MG/3ML SOLN 360 mL     Sig: Take 3 mLs by nebulization every 12 (twelve) hours as needed.     Pulmonology:  [facility] - albuterol / ipratropium Passed - 08/02/2023 11:15 AM      Passed - Last BP in normal range    BP Readings from Last 1 Encounters:  07/30/23 117/82         Passed - Last Heart Rate in normal range    Pulse Readings from Last 1 Encounters:  07/30/23 82         Passed - Valid encounter within last 12 months    Recent Outpatient Visits           3 days ago Elevated blood pressure reading in office without diagnosis of hypertension   [facility], MD   3 months ago Cough, unspecified type   [facility], Amy J, NP   6 months ago Rash and nonspecific skin eruption   [place] [facility], Amy J, NP   8 months ago Nasal congestion   [place] [facility], [place], NP   1 year ago Cough, unspecified type   [facility], [place], NP       Future Appointments             In 1 week Rema Fendt, NP [facility] at [facility]   In 3 months Joline Maxcy, MD [facility]               Requested Prescriptions  Pending Prescriptions Disp Refills   ipratropium-albuterol (DUONEB) 0.5-2.5 (3) MG/3ML SOLN 360 mL     Sig: Take 3 mLs by nebulization every 12 (twelve) hours as needed.     Pulmonology:  [facility] [employer] - albuterol / ipratropium Passed - 08/02/2023  11:15 AM      Passed - Last BP in normal range    BP Readings from Last 1 Encounters:  07/30/23 117/82         Passed - Last Heart Rate in normal range    Pulse Readings from Last 1 Encounters:  07/30/23 82         Passed - Valid encounter within last 12 months    Recent Outpatient Visits           3 days ago Elevated blood pressure reading in office without diagnosis of hypertension   [place] [facility], MD   3 months ago Cough, unspecified type   [facility], Amy J, NP   6 months ago Rash and nonspecific skin eruption   [place] [facility], Amy J, NP   8 months ago Nasal congestion   [place] [facility], [place], NP   1 year ago Cough, unspecified type   [facility], Salomon Fick, NP  Future Appointments             In 1 week Rema Fendt, NP [facility] at [facility]   In 3 months Joline Maxcy, MD [facility] at [facility]

## 2023-08-03 ENCOUNTER — Encounter: Payer: Self-pay | Admitting: Family Medicine

## 2023-08-05 NOTE — Progress Notes (Unsigned)
Jerry Steele, male    DOB: [birth year]   MRN: [id number]   Brief patient profile:  58 yo [place] male quit smoking in 2018   Dr  Kendrick Fries then [facility] pt   self- referred to pulmonary clinic 08/06/2023  or cough and sob       Note PFTS in 05/2017 s significant airflow obstruction   History of Present Illness  08/06/2023  Pulmonary/ 1st office eval/Radha Coggins  Chief Complaint  Patient presents with   Consult  4 months prior to OV  better for several weeks p rx prednisone and downhill since then, very confused with all of his meds and doesn't recognize meds just prescribed on list from the last month prior to OV  so really not clear what he is and is not taking   Dyspnea:  MMRC2 = can't walk a nl pace on a flat grade s sob but does fine slow and flat  Cough: min clear . Very harsh    SABA use: using nebs qid not prn     No obvious day to day or daytime pattern/variability or assoc excess/ purulent sputum or mucus plugs or hemoptysis or cp or chest tightness, subjective wheeze or overt sinus or hb symptoms.    Also denies any obvious fluctuation of symptoms with weather or environmental changes or other aggravating or alleviating factors except as outlined above   No unusual exposure hx or h/o childhood pna/ asthma or knowledge of premature birth.  Current Allergies, Complete Past Medical History, Past Surgical History, Family History, and Social History were reviewed in [employer] record.  ROS  The following are not active complaints unless bolded Hoarseness, sore throat, dysphagia, dental problems, itching, sneezing,  nasal congestion or discharge of excess mucus or purulent secretions, ear ache,   fever, chills, sweats, unintended wt loss or wt gain, classically pleuritic or exertional cp,  orthopnea pnd or arm/hand swelling  or leg swelling, presyncope, palpitations, abdominal pain, anorexia, nausea, vomiting, diarrhea  or change in bowel habits or change  in bladder habits, change in stools or change in urine, dysuria, hematuria,  rash, arthralgias, visual complaints, headache, numbness, weakness or ataxia or problems with walking or coordination,  change in mood or  memory.             Outpatient Medications Prior to Visit - - NOTE:   Unable to verify as accurately reflecting what pt takes    Medication Sig Dispense Refill   acetaminophen (TYLENOL) 500 MG tablet Take 1,000 mg by mouth every 6 (six) hours as needed.     albuterol (VENTOLIN HFA) 108 (90 Base) MCG/ACT inhaler Inhale 1-2 puffs into the lungs every 6 (six) hours as needed for wheezing or shortness of breath. 6.7 g 0   benzonatate (TESSALON) 100 MG capsule Take 1 capsule (100 mg total) by mouth every 8 (eight) hours. 21 capsule 0   cetirizine (ZYRTEC) 10 MG tablet Take 1 tablet (10 mg total) by mouth daily. 30 tablet 1   gabapentin (NEURONTIN) 300 MG capsule Take 1 capsule (300 mg total) by mouth at bedtime. 30 capsule 3   ibuprofen (ADVIL) 600 MG tablet Take 1 tablet (600 mg total) by mouth every 8 (eight) hours as needed. 21 tablet 0   ipratropium-albuterol (DUONEB) 0.5-2.5 (3) MG/3ML SOLN Take 3 mLs by nebulization every 12 (twelve) hours as needed.     omeprazole (PRILOSEC) 20 MG capsule Take 1 capsule (20 mg total) by mouth 2 (two) times  daily before a meal. 60 capsule 1   predniSONE (DELTASONE) 50 MG tablet Take 1 tablet (50 mg total) by mouth daily. 5 tablet 0   sildenafil (REVATIO) 20 MG tablet TAKE 2 TO 5 TABLETS BY MOUTH 1 TO 2 HOURS PRIOR TO SEXUAL ACTIVITY ON AN EMPTY STOMACH 90 tablet 3   triamcinolone (KENALOG) 0.025 % ointment Apply 1 Application topically 2 (two) times daily. 60 g 0   atorvastatin (LIPITOR) 40 MG tablet Take 1 tablet (40 mg total) by mouth daily. 30 tablet 2   No facility-administered medications prior to visit.    Past Medical History:  Diagnosis Date   Anginal pain (HCC)    Arthritis of both hands    Asthma    COPD (chronic obstructive  pulmonary disease) (HCC)    Dyspnea    Emphysema lung (HCC)    Former smoker    GERD (gastroesophageal reflux disease)    Hyperlipidemia    Pneumonia    Pre-diabetes    Prostate cancer (HCC)       Objective:     BP 102/64 (BP Location: Left Arm, Patient Position: Sitting, Cuff Size: Normal)   Pulse 72   Temp 97.7 F (36.5 C) (Oral)   Resp (!) 98   Ht 6\' 3"  (1.905 m)   Wt 193 lb (87.5 kg)   BMI 24.12 kg/m   Amb wm nad/ English challenging to communicate esp re meds    HEENT : Oropharynx  clear   Nasal turbinates nl    NECK :  without  apparent JVD/ palpable Nodes/TM    LUNGS: no acc muscle use,  Min barrel  contour chest wall with bilateral  distant insp/ exp rhonchi and  without cough exp maneuvers and min  Hyperresonant  to  percussion bilaterally    CV:  RRR  no s3 or murmur or increase in P2, and no edema   ABD:  soft and nontender with pos end  insp Hoover's  in the supine position.  No bruits or organomegaly appreciated   MS:  Nl gait/ ext warm without deformities Or obvious joint restrictions  calf tenderness, cyanosis or clubbing     SKIN: warm and dry without lesions    NEURO:  alert, approp, nl sensorium with  no motor or cerebellar deficits apparent.              Assessment   No problem-specific Assessment & Plan notes found for this encounter.     Sandrea Hughs, MD 08/06/2023

## 2023-08-06 ENCOUNTER — Ambulatory Visit: Payer: 59 | Admitting: Internal Medicine

## 2023-08-06 ENCOUNTER — Encounter: Payer: Self-pay | Admitting: Internal Medicine

## 2023-08-06 VITALS — BP 102/64 | HR 72 | Temp 97.7°F | Resp 98 | Ht 75.0 in | Wt 193.0 lb

## 2023-08-06 DIAGNOSIS — J4489 Other specified chronic obstructive pulmonary disease: Secondary | ICD-10-CM | POA: Insufficient documentation

## 2023-08-06 MED ORDER — BREZTRI AEROSPHERE 160-9-4.8 MCG/ACT IN AERO: 2.0000 | INHALATION_SPRAY | Freq: Two times a day (BID) | RESPIRATORY_TRACT | Status: DC

## 2023-08-06 MED ORDER — METHYLPREDNISOLONE ACETATE 80 MG/ML IJ SUSP
120.0000 mg | Freq: Once | INTRAMUSCULAR | Status: AC
Start: 2023-08-06 — End: 2023-08-06
  Administered 2023-08-06: 120 mg via INTRAMUSCULAR

## 2023-08-06 NOTE — Addendum Note (Signed)
Addended byClyda Greener M on: 08/06/2023 01:34 PM   Modules accepted: Orders

## 2023-08-06 NOTE — Patient Instructions (Addendum)
Instructions  from Sandrea Hughs  Plan A = Automatic = Always=    Breztri Take 2 puffs first thing in am and then another 2 puffs about 12 hours later.    .Work on inhaler technique:  relax and gently blow all the way out then take a nice smooth full deep breath back in, triggering the inhaler at same time you start breathing in.  Hold breath in for at least  5 seconds if you can. Blow out breztri  thru nose. Rinse and gargle with water when done.  If mouth or throat bother you at all,  try brushing teeth/gums/tongue with arm and hammer toothpaste/ make a slurry and gargle and spit out.    >>>  Remember how golfers warm up by taking practice swings - do this with an empty inhaler      Plan B = Backup (to supplement plan A, not to replace it) Only use your albuterol inhaler as a rescue medication to be used if you can't catch your breath by resting or doing a relaxed purse lip breathing pattern.  - The less you use it, the better it will work when you need it. - Ok to use the inhaler up to 2 puffs  every 4 hours if you must but call for appointment if use goes up over your usual need - Don't leave home without it !!  (think of it like the spare tire for your car)    Plan C = Crisis (instead of Plan B but only if Plan B stops working) - only use your albuterol nebulizer if you first try Plan B and it fails to help > ok to use the nebulizer up to every 4 hours but if start needing it regularly call for immediate appointment   Depomedrol 120 mg IM    Omeprazole Take 30- 60 min before your first and last meals of the day    Cough > mucinex dm 1200mg  every 12 hours as needed    Please schedule a follow up office visit in [facility]  08/21/23  pm (ok to add on)   with all medications /inhalers/ solutions in hand so we can verify exactly what you are taking. This includes all medications from all doctors and over the counters         Your medications have changed today See your updated medication list  for details.  Ask your doctor where to pick up these medications  Breztri Aerosphere 160-9-4.8 MCG/ACT Aero

## 2023-08-06 NOTE — Assessment & Plan Note (Signed)
Quit smoking 2018 with PFTS in 05/2017 s significant airflow obstruction  - 08/06/2023  After extensive coaching inhaler device,  effectiveness =    60% > trial of breztri samples and f/u in [facility] office in 2 weeks   H/o marked improvement only on prenisone is in favor of AB and not COPD, esp based on prior PFTs at the time he quit smoking.    When respiratory symptoms begin or become refractory well after a patient reports complete smoking cessation,  Especially when this wasn't the case while they were smoking, a red flag is raised based on the work of Dr Primitivo Gauze which states:  if you quit smoking when your best day FEV1 is still well preserved it is highly unlikely you will progress to severe disease.  That is to say, once the smoking stops,  the symptoms should not suddenly erupt or markedly worsen.  If so, the differential diagnosis should include  obesity/deconditioning,  LPR/Reflux/Aspiration syndromes,  occult CHF, or  especially side effect of medications commonly used in this population.    Rec More aggressive GERDrx  with ppi bid ac   Depomedrol 120 mg IM  Mucinex dm 1200 mg bid and go ahead and use gabapentin as it was just prescribed in Dec 2024 rather mixing in narcotic containing cough meds   Be sure he master hfa and f/u in 2 weeks with all meds in hand using a trust but verify approach to confirm accurate Medication  Reconciliation The principal here is that until we are certain that the  patients are doing what we've asked, it makes no sense to ask them to do more.           Each maintenance medication was reviewed in detail including emphasizing most importantly the difference between maintenance and prns and under what circumstances the prns are to be triggered using an action plan format where appropriate.  Total time for H and P, chart review, counseling, reviewing hfa/neb  device(s) and generating customized AVS unique to this office visit / same day charting  > 40 min  with challenging pt with refractory resp symptoms of uncertain origin

## 2023-08-14 ENCOUNTER — Encounter: Payer: Self-pay | Admitting: Family

## 2023-08-14 ENCOUNTER — Ambulatory Visit (INDEPENDENT_AMBULATORY_CARE_PROVIDER_SITE_OTHER): Payer: 59 | Admitting: Family

## 2023-08-14 VITALS — BP 127/78 | HR 63 | Temp 97.4°F | Ht 75.0 in | Wt 193.8 lb

## 2023-08-14 DIAGNOSIS — R42 Dizziness and giddiness: Secondary | ICD-10-CM

## 2023-08-14 DIAGNOSIS — M545 Low back pain, unspecified: Secondary | ICD-10-CM | POA: Diagnosis not present

## 2023-08-14 DIAGNOSIS — Z23 Encounter for immunization: Secondary | ICD-10-CM

## 2023-08-14 LAB — POCT URINALYSIS DIP (CLINITEK)
Bilirubin, UA: NEGATIVE
Glucose, UA: NEGATIVE mg/dL
Ketones, POC UA: NEGATIVE mg/dL
Leukocytes, UA: NEGATIVE
Nitrite, UA: NEGATIVE
POC PROTEIN,UA: NEGATIVE
Spec Grav, UA: 1.025
Urobilinogen, UA: 0.2 U/dL
pH, UA: 6

## 2023-08-14 NOTE — Progress Notes (Signed)
Patient ID: Jerry Steele, male    DOB: [birth year]  MRN: [id number]  CC: Follow-Up  Subjective: Jerry Steele is a 58 y.o. male who presents for follow-up.   His concerns today include:  - Intermittent dizziness. Denies red flag symptoms. - Intermittent lower back pain. Denies recent trauma/injury and red flag symptoms. States thinks could be related to his kidneys.  - Established with Cardiology.  - Established with Pulmonology.  - Established with General Surgery.  - Established with Urology.  Patient Active Problem List   Diagnosis Date Noted   Asthmatic bronchitis , chronic (HCC) 08/06/2023   Non-recurrent bilateral inguinal hernia without obstruction or gangrene 10/10/2022   Prediabetes 04/14/2022   Dyspnea on exertion 04/12/2022   COPD (chronic obstructive pulmonary disease) with emphysema (HCC) 07/19/2021   Hyperlipidemia 05/05/2021   Rectal bleeding 11/15/2020   Acute bronchitis 08/30/2018   Spell of dizziness 03/14/2018   Viral upper respiratory illness 03/14/2018   Cough variant asthma 11/27/2017   Cough 06/27/2017   Rib pain 06/27/2017     Current Outpatient Medications on File Prior to Visit  Medication Sig Dispense Refill   acetaminophen (TYLENOL) 500 MG tablet Take 1,000 mg by mouth every 6 (six) hours as needed.     albuterol (VENTOLIN HFA) 108 (90 Base) MCG/ACT inhaler Inhale 1-2 puffs into the lungs every 6 (six) hours as needed for wheezing or shortness of breath. 6.7 g 0   benzonatate (TESSALON) 100 MG capsule Take 1 capsule (100 mg total) by mouth every 8 (eight) hours. 21 capsule 0   Budeson-Glycopyrrol-Formoterol (BREZTRI AEROSPHERE) 160-9-4.8 MCG/ACT AERO Inhale 2 puffs into the lungs in the morning and at bedtime.     cetirizine (ZYRTEC) 10 MG tablet Take 1 tablet (10 mg total) by mouth daily. 30 tablet 1   ibuprofen (ADVIL) 600 MG tablet Take 1 tablet (600 mg total) by mouth every 8 (eight) hours as needed. 21 tablet 0   ipratropium-albuterol  (DUONEB) 0.5-2.5 (3) MG/3ML SOLN Take 3 mLs by nebulization every 12 (twelve) hours as needed.     omeprazole (PRILOSEC) 20 MG capsule Take 1 capsule (20 mg total) by mouth 2 (two) times daily before a meal. 60 capsule 1   sildenafil (REVATIO) 20 MG tablet TAKE 2 TO 5 TABLETS BY MOUTH 1 TO 2 HOURS PRIOR TO SEXUAL ACTIVITY ON AN EMPTY STOMACH 90 tablet 3   triamcinolone (KENALOG) 0.025 % ointment Apply 1 Application topically 2 (two) times daily. 60 g 0   atorvastatin (LIPITOR) 40 MG tablet Take 1 tablet (40 mg total) by mouth daily. 30 tablet 2   gabapentin (NEURONTIN) 300 MG capsule Take 1 capsule (300 mg total) by mouth at bedtime. (Patient not taking: Reported on 08/14/2023) 30 capsule 3   predniSONE (DELTASONE) 50 MG tablet Take 1 tablet (50 mg total) by mouth daily. (Patient not taking: Reported on 08/14/2023) 5 tablet 0   No current facility-administered medications on file prior to visit.    No Known Allergies  Social History   Socioeconomic History   Marital status: Divorced    Spouse name: Not on file   Number of children: Not on file   Years of education: Not on file   Highest education level: Not on file  Occupational History   Not on file  Tobacco Use   Smoking status: Former    Current packs/day: 0.00    Average packs/day: 0.3 packs/day for 30.0 years (7.5 ttl pk-yrs)    Types: Cigarettes  Start date: 12/17/1986    Quit date: 12/16/2016    Years since quitting: 6.6    Passive exposure: Never   Smokeless tobacco: Never  Vaping Use   Vaping status: Never Used  Substance and Sexual Activity   Alcohol use: No    Alcohol/week: 0.0 standard drinks of alcohol   Drug use: No   Sexual activity: Yes  Other Topics Concern   Not on file  Social History Narrative   Lives with son   Social Drivers of Corporate investment banker Strain: Not on file  Food Insecurity: Not on file  Transportation Needs: Not on file  Physical Activity: Not on file  Stress: Not on file   Social Connections: Not on file  Intimate Partner Violence: Not on file    Family History  Problem Relation Age of Onset   Sudden Cardiac Death Brother 34   Colon cancer Neg Hx    Colon polyps Neg Hx    Esophageal cancer Neg Hx    Rectal cancer Neg Hx    Stomach cancer Neg Hx     Past Surgical History:  Procedure Laterality Date   HEMORRHOID SURGERY  1985   x2    ROS: Review of Systems Negative except as stated above  PHYSICAL EXAM: BP 127/78   Pulse 63   Temp (!) 97.4 F (36.3 C) (Oral)   Ht 6\' 3"  (1.905 m)   Wt 193 lb 12.8 oz (87.9 kg)   SpO2 96%   BMI 24.22 kg/m   Physical Exam HENT:     Head: Normocephalic and atraumatic.     Nose: Nose normal.     Mouth/Throat:     Mouth: Mucous membranes are moist.     Pharynx: Oropharynx is clear.  Eyes:     Extraocular Movements: Extraocular movements intact.     Conjunctiva/sclera: Conjunctivae normal.     Pupils: Pupils are equal, round, and reactive to light.  Cardiovascular:     Rate and Rhythm: Normal rate and regular rhythm.     Pulses: Normal pulses.     Heart sounds: Normal heart sounds.  Pulmonary:     Effort: Pulmonary effort is normal.     Breath sounds: Normal breath sounds.  Musculoskeletal:        General: Normal range of motion.     Cervical back: Normal range of motion and neck supple.  Neurological:     General: No focal deficit present.     Mental Status: He is alert and oriented to person, place, and time.  Psychiatric:        Mood and Affect: Mood normal.        Behavior: Behavior normal.     ASSESSMENT AND PLAN: 1. Low back pain, unspecified back pain laterality, unspecified chronicity, unspecified whether sciatica present (Primary) - Routine screening.  - Patient declined pharmacological therapy. - Follow-up with primary provider as scheduled.  - POCT URINALYSIS DIP (CLINITEK); Future  2. Dizziness - Routine screening.  - Patient declined pharmacological therapy.  - Follow-up  with primary provider as scheduled.  - TSH - CBC - Basic Metabolic Panel - Hemoglobin A1c    Patient was given the opportunity to ask questions.  Patient verbalized understanding of the plan and was able to repeat key elements of the plan. Patient was given clear instructions to go to Emergency Department or return to medical center if symptoms don't improve, worsen, or new problems develop.The patient verbalized understanding.   Orders Placed This  Encounter  Procedures   TSH   CBC   Basic Metabolic Panel   Hemoglobin A1c   POCT URINALYSIS DIP (CLINITEK)    Follow-up with primary provider as scheduled.   Rema Fendt, NP

## 2023-08-14 NOTE — Progress Notes (Signed)
Patient states Dr gave him a different inhaler.  Lower back pain left and right side.   Patient states sometimes he feels dizzy.  Multiple complaints to discuss.

## 2023-08-14 NOTE — Addendum Note (Signed)
Addended by: Kieth Brightly on: 08/14/2023 03:22 PM   Modules accepted: Orders

## 2023-08-15 ENCOUNTER — Encounter: Payer: Self-pay | Admitting: Family

## 2023-08-15 ENCOUNTER — Other Ambulatory Visit: Payer: Self-pay | Admitting: Urology

## 2023-08-15 LAB — CBC
Hematocrit: 45.3 % (ref 37.5–51.0)
Hemoglobin: 15.1 g/dL (ref 13.0–17.7)
MCH: 30.4 pg (ref 26.6–33.0)
MCHC: 33.3 g/dL (ref 31.5–35.7)
MCV: 91 fL (ref 79–97)
Platelets: 208 10*3/uL (ref 150–450)
RBC: 4.97 x10E6/uL (ref 4.14–5.80)
RDW: 12.8 % (ref 11.6–15.4)
WBC: 9.3 10*3/uL (ref 3.4–10.8)

## 2023-08-15 LAB — BASIC METABOLIC PANEL
BUN/Creatinine Ratio: 20 (ref 9–20)
BUN: 17 mg/dL (ref 6–24)
CO2: 26 mmol/L (ref 20–29)
Calcium: 9 mg/dL (ref 8.7–10.2)
Chloride: 100 mmol/L (ref 96–106)
Creatinine, Ser: 0.84 mg/dL (ref 0.76–1.27)
Glucose: 74 mg/dL (ref 70–99)
Potassium: 4.7 mmol/L (ref 3.5–5.2)
Sodium: 140 mmol/L (ref 134–144)
eGFR: 100 mL/min/{1.73_m2} (ref >59)

## 2023-08-15 LAB — TSH: TSH: 3.3 u[IU]/mL (ref 0.450–4.500)

## 2023-08-15 LAB — HEMOGLOBIN A1C
Est. average glucose Bld gHb Est-mCnc: 114 mg/dL
Hgb A1c MFr Bld: 5.6 % (ref 4.8–5.6)

## 2023-08-21 ENCOUNTER — Encounter: Payer: Self-pay | Admitting: General Surgery

## 2023-08-21 ENCOUNTER — Ambulatory Visit (INDEPENDENT_AMBULATORY_CARE_PROVIDER_SITE_OTHER): Payer: 59 | Admitting: General Surgery

## 2023-08-21 VITALS — BP 128/87 | HR 69 | Temp 97.9°F | Resp 14 | Ht 75.0 in | Wt 190.0 lb

## 2023-08-21 DIAGNOSIS — K4020 Bilateral inguinal hernia, without obstruction or gangrene, not specified as recurrent: Secondary | ICD-10-CM | POA: Diagnosis not present

## 2023-08-21 NOTE — Progress Notes (Signed)
 Jerry Steele; [id number]; 06-14-1966   HPI Patient is a 58 year old white male who was referred to my care by Ricky Stabs for evaluation and treatment of bilateral inguinal hernias.  Patient has had the hernias for some time.  He seems to be developing more groin pain when straining.  1 does not hurt more than the other, though the right side has more frequency in discomfort.  He has been seen by Dr[facility] Claudine Mouton at [facility] in the past, but his surgeries were canceled.  Apparently the patient had to cancel one of the surgeries due to his asthma/COPD.  He was released by his office.  He is followed by Dr. Sherene Sires of pulmonology.  He states his breathing is stable at the present time. Past Medical History:  Diagnosis Date   Anginal pain (HCC)    Arthritis of both hands    Asthma    COPD (chronic obstructive pulmonary disease) (HCC)    Dyspnea    Emphysema lung (HCC)    Former smoker    GERD (gastroesophageal reflux disease)    Hyperlipidemia    Pneumonia    Pre-diabetes    Prostate cancer (HCC)     Past Surgical History:  Procedure Laterality Date   HEMORRHOID SURGERY  1985   x2    Family History  Problem Relation Age of Onset   Sudden Cardiac Death Brother 29   Colon cancer Neg Hx    Colon polyps Neg Hx    Esophageal cancer Neg Hx    Rectal cancer Neg Hx    Stomach cancer Neg Hx     Current Outpatient Medications on File Prior to Visit  Medication Sig Dispense Refill   acetaminophen (TYLENOL) 500 MG tablet Take 1,000 mg by mouth every 6 (six) hours as needed.     albuterol (VENTOLIN HFA) 108 (90 Base) MCG/ACT inhaler Inhale 1-2 puffs into the lungs every 6 (six) hours as needed for wheezing or shortness of breath. 6.7 g 0   atorvastatin (LIPITOR) 40 MG tablet Take 1 tablet (40 mg total) by mouth daily. 30 tablet 2   Budeson-Glycopyrrol-Formoterol (BREZTRI AEROSPHERE) 160-9-4.8 MCG/ACT AERO Inhale 2 puffs into the lungs in the morning and at bedtime.     cetirizine (ZYRTEC) 10  MG tablet Take 1 tablet (10 mg total) by mouth daily. 30 tablet 1   ibuprofen (ADVIL) 600 MG tablet Take 1 tablet (600 mg total) by mouth every 8 (eight) hours as needed. 21 tablet 0   ipratropium-albuterol (DUONEB) 0.5-2.5 (3) MG/3ML SOLN Take 3 mLs by nebulization every 12 (twelve) hours as needed.     omeprazole (PRILOSEC) 20 MG capsule Take 1 capsule (20 mg total) by mouth 2 (two) times daily before a meal. 60 capsule 1   sildenafil (REVATIO) 20 MG tablet TAKE 2 TO 5 TABLETS BY MOUTH 1 TO 2 HOURS PRIOR TO SEXUAL ACTIVITY ON AN EMPTY STOMACH 90 tablet 0   triamcinolone (KENALOG) 0.025 % ointment Apply 1 Application topically 2 (two) times daily. 60 g 0   No current facility-administered medications on file prior to visit.    No Known Allergies  Social History   Substance and Sexual Activity  Alcohol Use No   Alcohol/week: 0.0 standard drinks of alcohol    Social History   Tobacco Use  Smoking Status Former   Current packs/day: 0.00   Average packs/day: 0.3 packs/day for 30.0 years (7.5 ttl pk-yrs)   Types: Cigarettes   Start date: 12/17/1986   Quit date: 12/16/2016  Years since quitting: 6.6   Passive exposure: Never  Smokeless Tobacco Never    Review of Systems  Constitutional: Negative.   HENT: Negative.    Eyes:  Positive for blurred vision.  Respiratory:  Positive for cough and shortness of breath.   Cardiovascular: Negative.   Gastrointestinal:  Positive for abdominal pain.  Genitourinary: Negative.   Musculoskeletal:  Positive for back pain, joint pain and neck pain.  Skin: Negative.   Neurological: Negative.   Endo/Heme/Allergies: Negative.   Psychiatric/Behavioral: Negative.      Objective   Vitals:   08/21/23 1410  BP: 128/87  Pulse: 69  Resp: 14  Temp: 97.9 F (36.6 C)  SpO2: 96%    Physical Exam Vitals reviewed.  Constitutional:      Appearance: Normal appearance. He is normal weight. He is not ill-appearing.  HENT:     Head: Normocephalic  and atraumatic.  Cardiovascular:     Rate and Rhythm: Normal rate and regular rhythm.     Heart sounds: Normal heart sounds. No murmur heard.    No friction rub. No gallop.  Pulmonary:     Effort: Pulmonary effort is normal. No respiratory distress.     Breath sounds: Normal breath sounds. No stridor. No wheezing, rhonchi or rales.  Abdominal:     General: Abdomen is flat. Bowel sounds are normal. There is no distension.     Palpations: Abdomen is soft. There is no mass.     Tenderness: There is no abdominal tenderness. There is no guarding or rebound.     Hernia: A hernia is present.     Comments: Bilateral inguinal hernias, right greater than the left.  Genitourinary:    Testes: Normal.  Skin:    General: Skin is warm and dry.  Neurological:     Mental Status: He is alert and oriented to person, place, and time.   Pulmonology notes reviewed  Assessment  Bilateral inguinal hernias Asthma/COPD, controlled Plan  Patient will see his pulmonologist prior to surgical intervention.  He is scheduled for a robotic assisted laparoscopic bilateral inguinal herniorrhaphies with mesh on 10/03/2023.  The risks and benefits of the procedure including bleeding, infection, mesh use, and the possibility of recurrence of the hernias as well as the possibility of pulmonary issues were fully explained to the patient, who gave informed consent.

## 2023-09-12 NOTE — H&P (Signed)
 Jerry Steele; [id number]; 06-14-1966   HPI Patient is a 58 year old white male who was referred to my care by Ricky Stabs for evaluation and treatment of bilateral inguinal hernias.  Patient has had the hernias for some time.  He seems to be developing more groin pain when straining.  1 does not hurt more than the other, though the right side has more frequency in discomfort.  He has been seen by Dr. Claudine Mouton at [facility] in the past, but his surgeries were canceled.  Apparently the patient had to cancel one of the surgeries due to his asthma/COPD.  He was released by his office.  He is followed by Dr. Sherene Sires of pulmonology.  He states his breathing is stable at the present time. Past Medical History:  Diagnosis Date   Anginal pain (HCC)    Arthritis of both hands    Asthma    COPD (chronic obstructive pulmonary disease) (HCC)    Dyspnea    Emphysema lung (HCC)    Former smoker    GERD (gastroesophageal reflux disease)    Hyperlipidemia    Pneumonia    Pre-diabetes    Prostate cancer (HCC)     Past Surgical History:  Procedure Laterality Date   HEMORRHOID SURGERY  1985   x2    Family History  Problem Relation Age of Onset   Sudden Cardiac Death Brother 29   Colon cancer Neg Hx    Colon polyps Neg Hx    Esophageal cancer Neg Hx    Rectal cancer Neg Hx    Stomach cancer Neg Hx     Current Outpatient Medications on File Prior to Visit  Medication Sig Dispense Refill   acetaminophen (TYLENOL) 500 MG tablet Take 1,000 mg by mouth every 6 (six) hours as needed.     albuterol (VENTOLIN HFA) 108 (90 Base) MCG/ACT inhaler Inhale 1-2 puffs into the lungs every 6 (six) hours as needed for wheezing or shortness of breath. 6.7 g 0   atorvastatin (LIPITOR) 40 MG tablet Take 1 tablet (40 mg total) by mouth daily. 30 tablet 2   Budeson-Glycopyrrol-Formoterol (BREZTRI AEROSPHERE) 160-9-4.8 MCG/ACT AERO Inhale 2 puffs into the lungs in the morning and at bedtime.     cetirizine (ZYRTEC) 10  MG tablet Take 1 tablet (10 mg total) by mouth daily. 30 tablet 1   ibuprofen (ADVIL) 600 MG tablet Take 1 tablet (600 mg total) by mouth every 8 (eight) hours as needed. 21 tablet 0   ipratropium-albuterol (DUONEB) 0.5-2.5 (3) MG/3ML SOLN Take 3 mLs by nebulization every 12 (twelve) hours as needed.     omeprazole (PRILOSEC) 20 MG capsule Take 1 capsule (20 mg total) by mouth 2 (two) times daily before a meal. 60 capsule 1   sildenafil (REVATIO) 20 MG tablet TAKE 2 TO 5 TABLETS BY MOUTH 1 TO 2 HOURS PRIOR TO SEXUAL ACTIVITY ON AN EMPTY STOMACH 90 tablet 0   triamcinolone (KENALOG) 0.025 % ointment Apply 1 Application topically 2 (two) times daily. 60 g 0   No current facility-administered medications on file prior to visit.    No Known Allergies  Social History   Substance and Sexual Activity  Alcohol Use No   Alcohol/week: 0.0 standard drinks of alcohol    Social History   Tobacco Use  Smoking Status Former   Current packs/day: 0.00   Average packs/day: 0.3 packs/day for 30.0 years (7.5 ttl pk-yrs)   Types: Cigarettes   Start date: 12/17/1986   Quit date: 12/16/2016  Years since quitting: 6.6   Passive exposure: Never  Smokeless Tobacco Never    Review of Systems  Constitutional: Negative.   HENT: Negative.    Eyes:  Positive for blurred vision.  Respiratory:  Positive for cough and shortness of breath.   Cardiovascular: Negative.   Gastrointestinal:  Positive for abdominal pain.  Genitourinary: Negative.   Musculoskeletal:  Positive for back pain, joint pain and neck pain.  Skin: Negative.   Neurological: Negative.   Endo/Heme/Allergies: Negative.   Psychiatric/Behavioral: Negative.      Objective   Vitals:   08/21/23 1410  BP: 128/87  Pulse: 69  Resp: 14  Temp: 97.9 F (36.6 C)  SpO2: 96%    Physical Exam Vitals reviewed.  Constitutional:      Appearance: Normal appearance. He is normal weight. He is not ill-appearing.  HENT:     Head: Normocephalic  and atraumatic.  Cardiovascular:     Rate and Rhythm: Normal rate and regular rhythm.     Heart sounds: Normal heart sounds. No murmur heard.    No friction rub. No gallop.  Pulmonary:     Effort: Pulmonary effort is normal. No respiratory distress.     Breath sounds: Normal breath sounds. No stridor. No wheezing, rhonchi or rales.  Abdominal:     General: Abdomen is flat. Bowel sounds are normal. There is no distension.     Palpations: Abdomen is soft. There is no mass.     Tenderness: There is no abdominal tenderness. There is no guarding or rebound.     Hernia: A hernia is present.     Comments: Bilateral inguinal hernias, right greater than the left.  Genitourinary:    Testes: Normal.  Skin:    General: Skin is warm and dry.  Neurological:     Mental Status: He is alert and oriented to person, place, and time.   Pulmonology notes reviewed  Assessment  Bilateral inguinal hernias Asthma/COPD, controlled Plan  Patient will see his pulmonologist prior to surgical intervention.  He is scheduled for a robotic assisted laparoscopic bilateral inguinal herniorrhaphies with mesh on 10/03/2023.  The risks and benefits of the procedure including bleeding, infection, mesh use, and the possibility of recurrence of the hernias as well as the possibility of pulmonary issues were fully explained to the patient, who gave informed consent.

## 2023-10-01 ENCOUNTER — Encounter (HOSPITAL_COMMUNITY)
Admission: RE | Admit: 2023-10-01 | Discharge: 2023-10-01 | Disposition: A | Discharge status: Home / Self Care | Payer: 59 | Source: Ambulatory Visit | Attending: General Surgery | Admitting: General Surgery

## 2023-10-01 ENCOUNTER — Encounter (HOSPITAL_COMMUNITY): Payer: Self-pay

## 2023-10-01 ENCOUNTER — Other Ambulatory Visit: Payer: Self-pay

## 2023-10-02 NOTE — Anesthesia Preprocedure Evaluation (Signed)
 Anesthesia Evaluation  Patient identified by MRN, date of birth, ID band Patient awake    Reviewed: Allergy & Precautions, H&P , NPO status , Patient's Chart, lab work & pertinent test results, reviewed documented beta blocker date and time   Airway Mallampati: II  TM Distance: >3 FB Neck ROM: full    Dental no notable dental hx. (+) Dental Advisory Given, Teeth Intact   Pulmonary shortness of breath, asthma , pneumonia, COPD, former smoker Emphysema   Pulmonary exam normal breath sounds clear to auscultation       Cardiovascular Exercise Tolerance: Good + angina  Normal cardiovascular exam Rhythm:regular Rate:Normal     Neuro/Psych negative neurological ROS  negative psych ROS   GI/Hepatic Neg liver ROS,GERD  ,,  Endo/Other  negative endocrine ROS  Pre diabetes  Renal/GU negative Renal ROS  negative genitourinary   Musculoskeletal  (+) Arthritis , Osteoarthritis,    Abdominal   Peds negative pediatric ROS (+)  Hematology negative hematology ROS (+)   Anesthesia Other Findings Prostate cancer  Reproductive/Obstetrics negative OB ROS                             Anesthesia Physical Anesthesia Plan  ASA: 3  Anesthesia Plan: General   Post-op Pain Management: Dilaudid IV   Induction:   PONV Risk Score and Plan: Ondansetron, Dexamethasone and Midazolam  Airway Management Planned: Oral ETT  Additional Equipment: None  Intra-op Plan:   Post-operative Plan: Extubation in OR  Informed Consent: I have reviewed the patients History and Physical, chart, labs and discussed the procedure including the risks, benefits and alternatives for the proposed anesthesia with the patient or authorized representative who has indicated his/her understanding and acceptance.     Dental Advisory Given  Plan Discussed with: CRNA  Anesthesia Plan Comments:        Anesthesia Quick  Evaluation

## 2023-10-03 ENCOUNTER — Ambulatory Visit (HOSPITAL_COMMUNITY): Payer: Self-pay | Admitting: Anesthesiology

## 2023-10-03 ENCOUNTER — Other Ambulatory Visit: Payer: Self-pay

## 2023-10-03 ENCOUNTER — Ambulatory Visit (HOSPITAL_BASED_OUTPATIENT_CLINIC_OR_DEPARTMENT_OTHER): Payer: Self-pay | Admitting: Anesthesiology

## 2023-10-03 ENCOUNTER — Encounter (HOSPITAL_COMMUNITY): Payer: Self-pay | Admitting: General Surgery

## 2023-10-03 ENCOUNTER — Encounter (HOSPITAL_COMMUNITY)
Admission: RE | Disposition: A | Discharge status: Home / Self Care | Payer: Self-pay | Source: Home / Self Care | Attending: General Surgery

## 2023-10-03 ENCOUNTER — Ambulatory Visit (HOSPITAL_COMMUNITY)
Admission: RE | Admit: 2023-10-03 | Discharge: 2023-10-03 | Disposition: A | Discharge status: Home / Self Care | Payer: 59 | Attending: General Surgery | Admitting: General Surgery

## 2023-10-03 DIAGNOSIS — R7303 Prediabetes: Secondary | ICD-10-CM | POA: Diagnosis not present

## 2023-10-03 DIAGNOSIS — K4090 Unilateral inguinal hernia, without obstruction or gangrene, not specified as recurrent: Secondary | ICD-10-CM

## 2023-10-03 DIAGNOSIS — J449 Chronic obstructive pulmonary disease, unspecified: Secondary | ICD-10-CM

## 2023-10-03 DIAGNOSIS — J439 Emphysema, unspecified: Secondary | ICD-10-CM | POA: Insufficient documentation

## 2023-10-03 DIAGNOSIS — K219 Gastro-esophageal reflux disease without esophagitis: Secondary | ICD-10-CM | POA: Diagnosis not present

## 2023-10-03 DIAGNOSIS — J4489 Other specified chronic obstructive pulmonary disease: Secondary | ICD-10-CM | POA: Diagnosis not present

## 2023-10-03 DIAGNOSIS — Z87891 Personal history of nicotine dependence: Secondary | ICD-10-CM

## 2023-10-03 DIAGNOSIS — K4020 Bilateral inguinal hernia, without obstruction or gangrene, not specified as recurrent: Secondary | ICD-10-CM

## 2023-10-03 DIAGNOSIS — D176 Benign lipomatous neoplasm of spermatic cord: Secondary | ICD-10-CM | POA: Diagnosis not present

## 2023-10-03 DIAGNOSIS — M1990 Unspecified osteoarthritis, unspecified site: Secondary | ICD-10-CM | POA: Insufficient documentation

## 2023-10-03 LAB — GLUCOSE, CAPILLARY: Glucose-Capillary: 86 mg/dL (ref 70–99)

## 2023-10-03 SURGERY — REPAIR, HERNIA, INGUINAL, BILATERAL, ROBOT-ASSISTED
Anesthesia: General | Site: Inguinal | Laterality: Bilateral

## 2023-10-03 MED ORDER — HYDROMORPHONE HCL 1 MG/ML IJ SOLN: 0.2500 mg | INTRAMUSCULAR | Status: DC | PRN

## 2023-10-03 MED ORDER — ACETAMINOPHEN 500 MG PO TABS: 1000.0000 mg | ORAL_TABLET | Freq: Once | ORAL | Status: DC

## 2023-10-03 MED ORDER — LIDOCAINE HCL (CARDIAC) PF 100 MG/5ML IV SOSY
PREFILLED_SYRINGE | INTRAVENOUS | Status: DC | PRN
  Administered 2023-10-03: 80 mg via INTRATRACHEAL

## 2023-10-03 MED ORDER — CHLORHEXIDINE GLUCONATE 0.12 % MT SOLN
15.0000 mL | Freq: Once | OROMUCOSAL | Status: AC
  Administered 2023-10-03: 15 mL via OROMUCOSAL

## 2023-10-03 MED ORDER — KETOROLAC TROMETHAMINE 30 MG/ML IJ SOLN
INTRAMUSCULAR | Status: AC
Start: 2023-10-03 — End: ?
  Filled 2023-10-03: qty 1

## 2023-10-03 MED ORDER — LACTATED RINGERS IV SOLN: INTRAVENOUS | Status: DC

## 2023-10-03 MED ORDER — KETOROLAC TROMETHAMINE 30 MG/ML IJ SOLN
INTRAMUSCULAR | Status: DC | PRN
  Administered 2023-10-03: 15 mg via INTRAVENOUS

## 2023-10-03 MED ORDER — BUPIVACAINE HCL (PF) 0.5 % IJ SOLN
INTRAMUSCULAR | Status: AC
  Filled 2023-10-03: qty 30

## 2023-10-03 MED ORDER — OXYCODONE HCL 5 MG/5ML PO SOLN: 5.0000 mg | Freq: Once | ORAL | Status: AC | PRN

## 2023-10-03 MED ORDER — FENTANYL CITRATE (PF) 100 MCG/2ML IJ SOLN
INTRAMUSCULAR | Status: AC
  Filled 2023-10-03: qty 2

## 2023-10-03 MED ORDER — DEXAMETHASONE SODIUM PHOSPHATE 10 MG/ML IJ SOLN
INTRAMUSCULAR | Status: AC
  Filled 2023-10-03: qty 2

## 2023-10-03 MED ORDER — SODIUM CHLORIDE 0.9 % IV SOLN: 12.5000 mg | INTRAVENOUS | Status: DC | PRN

## 2023-10-03 MED ORDER — ACETAMINOPHEN 160 MG/5ML PO SOLN: 960.0000 mg | Freq: Once | ORAL | Status: DC

## 2023-10-03 MED ORDER — DEXAMETHASONE SODIUM PHOSPHATE 10 MG/ML IJ SOLN
INTRAMUSCULAR | Status: DC | PRN
  Administered 2023-10-03: 10 mg via INTRAVENOUS

## 2023-10-03 MED ORDER — PROPOFOL 10 MG/ML IV BOLUS
INTRAVENOUS | Status: DC | PRN
  Administered 2023-10-03: 200 mg via INTRAVENOUS

## 2023-10-03 MED ORDER — CHLORHEXIDINE GLUCONATE CLOTH 2 % EX PADS
6.0000 | MEDICATED_PAD | Freq: Once | CUTANEOUS | Status: AC
  Administered 2023-10-03: 6 via TOPICAL

## 2023-10-03 MED ORDER — KETOROLAC TROMETHAMINE 30 MG/ML IJ SOLN: 30.0000 mg | Freq: Once | INTRAMUSCULAR | Status: DC

## 2023-10-03 MED ORDER — FENTANYL CITRATE (PF) 100 MCG/2ML IJ SOLN
INTRAMUSCULAR | Status: DC | PRN
  Administered 2023-10-03: 25 ug via INTRAVENOUS
  Administered 2023-10-03 (×2): 50 ug via INTRAVENOUS
  Administered 2023-10-03: 75 ug via INTRAVENOUS

## 2023-10-03 MED ORDER — SUGAMMADEX SODIUM 200 MG/2ML IV SOLN
INTRAVENOUS | Status: DC | PRN
  Administered 2023-10-03: 150 mg via INTRAVENOUS

## 2023-10-03 MED ORDER — CHLORHEXIDINE GLUCONATE CLOTH 2 % EX PADS: 6.0000 | MEDICATED_PAD | Freq: Once | CUTANEOUS | Status: DC

## 2023-10-03 MED ORDER — BUPIVACAINE HCL (PF) 0.5 % IJ SOLN
INTRAMUSCULAR | Status: DC | PRN
  Administered 2023-10-03: 30 mL

## 2023-10-03 MED ORDER — ACETAMINOPHEN 500 MG PO TABS
ORAL_TABLET | ORAL | Status: AC
  Administered 2023-10-03: 500 mg
  Filled 2023-10-03: qty 2

## 2023-10-03 MED ORDER — LIDOCAINE HCL (PF) 2 % IJ SOLN
INTRAMUSCULAR | Status: AC
  Filled 2023-10-03: qty 5

## 2023-10-03 MED ORDER — ROCURONIUM BROMIDE 10 MG/ML (PF) SYRINGE
PREFILLED_SYRINGE | INTRAVENOUS | Status: AC
  Filled 2023-10-03: qty 10

## 2023-10-03 MED ORDER — SUGAMMADEX SODIUM 200 MG/2ML IV SOLN
INTRAVENOUS | Status: AC
Start: 2023-10-03 — End: ?
  Filled 2023-10-03: qty 2

## 2023-10-03 MED ORDER — CEFAZOLIN SODIUM-DEXTROSE 2-4 GM/100ML-% IV SOLN
INTRAVENOUS | Status: AC
  Filled 2023-10-03: qty 100

## 2023-10-03 MED ORDER — MIDAZOLAM HCL 2 MG/2ML IJ SOLN
INTRAMUSCULAR | Status: AC
  Filled 2023-10-03: qty 2

## 2023-10-03 MED ORDER — OXYCODONE HCL 5 MG PO TABS
5.0000 mg | ORAL_TABLET | Freq: Once | ORAL | Status: AC | PRN
  Administered 2023-10-03: 5 mg via ORAL
  Filled 2023-10-03: qty 1

## 2023-10-03 MED ORDER — ONDANSETRON HCL 4 MG/2ML IJ SOLN
INTRAMUSCULAR | Status: DC | PRN
  Administered 2023-10-03: 4 mg via INTRAVENOUS

## 2023-10-03 MED ORDER — ORAL CARE MOUTH RINSE: 15.0000 mL | Freq: Once | OROMUCOSAL | Status: AC

## 2023-10-03 MED ORDER — CEFAZOLIN SODIUM-DEXTROSE 2-4 GM/100ML-% IV SOLN
2.0000 g | INTRAVENOUS | Status: AC
  Administered 2023-10-03: 2 g via INTRAVENOUS

## 2023-10-03 MED ORDER — ROCURONIUM BROMIDE 100 MG/10ML IV SOLN
INTRAVENOUS | Status: DC | PRN
  Administered 2023-10-03 (×2): 10 mg via INTRAVENOUS
  Administered 2023-10-03: 20 mg via INTRAVENOUS
  Administered 2023-10-03: 70 mg via INTRAVENOUS

## 2023-10-03 MED ORDER — OXYCODONE HCL 5 MG PO TABS: 5.0000 mg | ORAL_TABLET | ORAL | 0 refills | Status: DC | PRN

## 2023-10-03 MED ORDER — STERILE WATER FOR IRRIGATION IR SOLN
Status: DC | PRN
  Administered 2023-10-03: 1000 mL

## 2023-10-03 SURGICAL SUPPLY — 44 items
CHLORAPREP W/TINT 26 (MISCELLANEOUS) ×2 IMPLANT
COVER LIGHT HANDLE STERIS (MISCELLANEOUS) ×2 IMPLANT
COVER MAYO STAND XLG (MISCELLANEOUS) ×2 IMPLANT
COVER TIP SHEARS 8 DVNC (MISCELLANEOUS) ×2 IMPLANT
DERMABOND ADVANCED .7 DNX12 (GAUZE/BANDAGES/DRESSINGS) ×2 IMPLANT
DERMABOND ADVANCED .7 DNX6 (GAUZE/BANDAGES/DRESSINGS) IMPLANT
DRAPE ARM DVNC X/XI (DISPOSABLE) ×6 IMPLANT
DRAPE COLUMN DVNC XI (DISPOSABLE) ×2 IMPLANT
DRIVER NDL MEGA SUTCUT DVNCXI (INSTRUMENTS) ×2 IMPLANT
DRIVER NDLE MEGA SUTCUT DVNCXI (INSTRUMENTS) ×1 IMPLANT
ELECT REM PT RETURN 9FT ADLT (ELECTROSURGICAL) ×1 IMPLANT
ELECTRODE REM PT RTRN 9FT ADLT (ELECTROSURGICAL) ×2 IMPLANT
FORCEPS BPLR R/ABLATION 8 DVNC (INSTRUMENTS) ×2 IMPLANT
GAUZE SPONGE 4X4 12PLY STRL (GAUZE/BANDAGES/DRESSINGS) ×2 IMPLANT
GLOVE BIOGEL PI IND STRL 7.0 (GLOVE) ×8 IMPLANT
GLOVE SURG SS PI 7.5 STRL IVOR (GLOVE) ×4 IMPLANT
GOWN STRL REUS W/TWL LRG LVL3 (GOWN DISPOSABLE) ×4 IMPLANT
KIT PINK PAD W/HEAD ARE REST (MISCELLANEOUS) ×1 IMPLANT
KIT PINK PAD W/HEAD ARM REST (MISCELLANEOUS) ×2 IMPLANT
KIT TURNOVER KIT A (KITS) ×2 IMPLANT
MANIFOLD NEPTUNE II (INSTRUMENTS) ×2 IMPLANT
MESH 3DMAX MID 5X7 LT XLRG (Mesh General) IMPLANT
MESH 3DMAX MID 5X7 RT XLRG (Mesh General) IMPLANT
NDL HYPO 21X1.5 SAFETY (NEEDLE) ×2 IMPLANT
NDL INSUFFLATION 14GA 120MM (NEEDLE) ×2 IMPLANT
NEEDLE HYPO 21X1.5 SAFETY (NEEDLE) ×1 IMPLANT
NEEDLE INSUFFLATION 14GA 120MM (NEEDLE) ×1 IMPLANT
OBTURATOR OPTICAL STND 8 DVNC (TROCAR) ×1 IMPLANT
OBTURATOR OPTICALSTD 8 DVNC (TROCAR) ×2 IMPLANT
PACK LAP CHOLE LZT030E (CUSTOM PROCEDURE TRAY) ×2 IMPLANT
PENCIL HANDSWITCHING (ELECTRODE) ×2 IMPLANT
POSITIONER HEAD 8X9X4 ADT (SOFTGOODS) ×2 IMPLANT
SCISSORS MNPLR CVD DVNC XI (INSTRUMENTS) ×2 IMPLANT
SEAL UNIV 5-12 XI (MISCELLANEOUS) ×6 IMPLANT
SET BASIN LINEN APH (SET/KITS/TRAYS/PACK) ×2 IMPLANT
SET TUBE SMOKE EVAC HIGH FLOW (TUBING) ×2 IMPLANT
SOL PREP POV-IOD 4OZ 10% (MISCELLANEOUS) ×2 IMPLANT
SUT MNCRL AB 4-0 PS2 18 (SUTURE) ×4 IMPLANT
SUT STRATA 3-0 SH (SUTURE) ×8 IMPLANT
SUT VIC AB 2-0 SH 27X BRD (SUTURE) ×2 IMPLANT
SYR 30ML LL (SYRINGE) ×2 IMPLANT
TAPE TRANSPORE STRL 2 31045 (GAUZE/BANDAGES/DRESSINGS) ×2 IMPLANT
TRAY FOL W/BAG SLVR 16FR STRL (SET/KITS/TRAYS/PACK) ×2 IMPLANT
WATER STERILE IRR 500ML POUR (IV SOLUTION) ×2 IMPLANT

## 2023-10-03 NOTE — Anesthesia Postprocedure Evaluation (Signed)
 Anesthesia Post Note  Patient: Wasil Sak  Procedure(s) Performed: XI ROBOTIC ASSISTED BILATERAL INGUINAL HERNIA W/ MESH (Bilateral: Inguinal)  Patient location during evaluation: PACU Anesthesia Type: General Level of consciousness: awake and alert Pain management: pain level controlled Vital Signs Assessment: post-procedure vital signs reviewed and stable Respiratory status: spontaneous breathing, nonlabored ventilation, respiratory function stable and patient connected to nasal cannula oxygen Cardiovascular status: blood pressure returned to baseline and stable Postop Assessment: no apparent nausea or vomiting Anesthetic complications: no   There were no known notable events for this encounter.   Last Vitals:  Vitals:   10/03/23 1115 10/03/23 1147  BP: 119/87 111/81  Pulse: 79 69  Resp: 10 12  Temp:  (!) 36.4 C  SpO2: 100% 99%    Last Pain:  Vitals:   10/03/23 1147  TempSrc: Oral  PainSc: 3                  Shekelia Boutin L Kwali Wrinkle

## 2023-10-03 NOTE — Interval H&P Note (Signed)
 History and Physical Interval Note:  10/03/2023 7:17 AM  Jerry Steele  has presented today for surgery, with the diagnosis of HERNIA, INGUINAL BILATERAL.  The various methods of treatment have been discussed with the patient and family. After consideration of risks, benefits and other options for treatment, the patient has consented to  Procedure(s) with comments: XI ROBOTIC ASSISTED BILATERAL INGUINAL HERNIA W/ MESH (Bilateral) - pt has been notified of new arrival time 6:15am KF as a surgical intervention.  The patient's history has been reviewed, patient examined, no change in status, stable for surgery.  I have reviewed the patient's chart and labs.  Questions were answered to the patient's satisfaction.     [name]

## 2023-10-03 NOTE — Op Note (Signed)
 Patient:  Jerry Steele  DOB:  12/13/1965  MRN:  [id number]   Preop Diagnosis: Bilateral inguinal hernias  Postop Diagnosis: Same  Procedure: Robotic assisted laparoscopic bilateral inguinal herniorrhaphy with mesh  Surgeon: [name], MD  Anes: General Endotracheal  Indications: Patient is a 58 year old white male who presents with bilateral inguinal hernias.  The risks and benefits of the procedures including bleeding, infection, mesh use, and the possibility of recurrence of the hernias were fully explained to the patient, who gave informed consent.  Procedure note: Patient was placed in the supine position.  After induction of general endotracheal anesthesia, the abdomen and perineum were prepped and draped using usual sterile technique with Betadine and ChloraPrep.  Surgical site confirmation was performed.  An incision was made at the left upper quadrant at Palmer's point.  A Veress needle was introduced into the abdominal cavity and confirmation of placement was done using the saline drop test.  The abdomen was then insufflated to 15 mmHg pressure.  An 8 mm trocar was introduced into the abdominal cavity under direct visualization without difficulty.  Additional 8 mm trocars were placed in the upper midline and right upper quadrant regions.  The patient was placed in Trendelenburg position.  The robot was then docked and targeted.  On inspection of the inguinal region, the patient was noted to have an indirect left inguinal hernia.  On the right side, he did not have a direct hernia but it was suspected he had a lipoma of the cord.  I first proceeded with the left inguinal hernia.  A peritoneal flap was then formed down to Cooper's ligament.  This was done around the hernia sac.  The hernia sac was then dissected free from the spermatic cord.  Approximately 6 cm of dissection was done posterior to the indirect hernia.  An extra-large Bard 3D max mesh was then inserted and secured to  Cooper's ligament using a 2-0 Vicryl suture.  An additional 2-0 Vicryl suture was placed anteriorly along the abdominal wall in order to fixate the mesh.  The peritoneum was then closed using a 3-0 stratafix running suture.  I then proceeded to address the right inguinal hernia.  A peritoneal flap was again formed down to Cooper's ligament.  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  This was freed away from the spermatic cord and excised.  An extra-large Bard 3D max mesh was then inserted and secured to Cooper's ligament using a 2-0 Vicryl interrupted suture.  An anterior stay suture with Vicryl was used to attach the mesh to the anterior abdominal wall.  The peritoneal flap was then closed using a 3-0 stratafix running suture.  Both mesh placements were well-approximated to the anterior abdominal wall.  The robot was undocked.  All air was evacuated from the abdominal cavity prior to removal of the trocars.  All wounds were irrigated with normal saline.  All wounds were injected with 0.5% Sensorcaine.  All incisions were closed using a 4-0 Monocryl subcuticular suture.  Dermabond was applied.  All tape and needle counts were correct at the end of the procedure.  The patient was extubated in the operating room and transferred to PACU in stable condition.  Complications: None  EBL: Minimal  Specimen: None

## 2023-10-03 NOTE — Anesthesia Procedure Notes (Signed)
 Procedure Name: Intubation Date/Time: 10/03/2023 7:48 AM  Performed by: Moshe Salisbury, CRNAPre-anesthesia Checklist: Patient identified, Patient being monitored, Timeout performed, Emergency Drugs available and Suction available Patient Re-evaluated:Patient Re-evaluated prior to induction Oxygen Delivery Method: Circle system utilized Preoxygenation: Pre-oxygenation with 100% oxygen Induction Type: IV induction Ventilation: Mask ventilation without difficulty Laryngoscope Size: Mac and 4 Grade View: Grade II Tube type: Oral Tube size: 8.0 mm Number of attempts: 1 Airway Equipment and Method: Stylet Placement Confirmation: ETT inserted through vocal cords under direct vision, positive ETCO2 and breath sounds checked- equal and bilateral Secured at: 21 cm Tube secured with: Tape Dental Injury: Teeth and Oropharynx as per pre-operative assessment

## 2023-10-03 NOTE — Transfer of Care (Signed)
 Immediate Anesthesia Transfer of Care Note  Patient: Jerry Steele  Procedure(s) Performed: XI ROBOTIC ASSISTED BILATERAL INGUINAL HERNIA W/ MESH (Bilateral)  Patient Location: PACU  Anesthesia Type:General  Level of Consciousness: awake  Airway & Oxygen Therapy: Patient Spontanous Breathing  Post-op Assessment: Report given to RN  Post vital signs: Reviewed and stable  Last Vitals:  Vitals Value Taken Time  BP 123/85 10/03/23 1045  Temp    Pulse 73 10/03/23 1050  Resp 21 10/03/23 1050  SpO2 96 % 10/03/23 1050  Vitals shown include unfiled device data.  Last Pain:  Vitals:   10/03/23 0712  PainSc: 0-No pain         Complications: No notable events documented.

## 2023-10-05 ENCOUNTER — Other Ambulatory Visit: Payer: Self-pay | Admitting: Urology

## 2023-10-10 ENCOUNTER — Telehealth: Payer: Self-pay | Admitting: *Deleted

## 2023-10-10 NOTE — Telephone Encounter (Signed)
 Surgical Date: 10/03/2023 Procedure: XI ROBOTIC ASSISTED BILATERAL INGUINAL HERNIA W/ MESH   Received call from patient [phone]~ telephone.  Patient reports increased pain in lower abdomen and into B testicles. Patient states that he has not been using APAP/ IBU. States that he has not been icing area either.   Recommended to begin OTC APAP/ IBU in addition to the Oxycodone. Advised to use ice pack 15- 20 minutes every hour as needed.   Recommended that patient return call if symptoms worsen.

## 2023-10-11 ENCOUNTER — Telehealth: Payer: Self-pay | Admitting: *Deleted

## 2023-10-11 NOTE — Telephone Encounter (Signed)
 Surgical Date: 10/03/2023 Procedure: XI ROBOTIC ASSISTED BILATERAL INGUINAL HERNIA W/ MESH    Received call from patient [phone]~ telephone.  Patient reports that he has followed recommendations with little to no relief in his pain in the lower abdomen and testicles.   Also reports that he has taken all of the oxycodone and requested refill to be sent to [facility] on [address]. Patient inquired if dose could be increased.   Please advise.

## 2023-10-12 ENCOUNTER — Other Ambulatory Visit (INDEPENDENT_AMBULATORY_CARE_PROVIDER_SITE_OTHER): Payer: Self-pay | Admitting: General Surgery

## 2023-10-12 DIAGNOSIS — Z09 Encounter for follow-up examination after completed treatment for conditions other than malignant neoplasm: Secondary | ICD-10-CM

## 2023-10-12 MED ORDER — OXYCODONE HCL 5 MG PO TABS: 5.0000 mg | ORAL_TABLET | ORAL | 0 refills | Status: AC | PRN

## 2023-10-12 NOTE — Telephone Encounter (Signed)
Provider refilled in another encounter

## 2023-10-12 NOTE — Progress Notes (Signed)
 Reordered Roxicodone for pain.

## 2023-10-18 ENCOUNTER — Encounter: Payer: Self-pay | Admitting: *Deleted

## 2023-10-18 ENCOUNTER — Encounter: Payer: Self-pay | Admitting: General Surgery

## 2023-10-18 ENCOUNTER — Ambulatory Visit (INDEPENDENT_AMBULATORY_CARE_PROVIDER_SITE_OTHER): Admitting: General Surgery

## 2023-10-18 VITALS — BP 110/75 | HR 71 | Temp 98.0°F | Resp 12 | Ht 75.0 in | Wt 190.0 lb

## 2023-10-18 DIAGNOSIS — Z09 Encounter for follow-up examination after completed treatment for conditions other than malignant neoplasm: Secondary | ICD-10-CM

## 2023-10-18 NOTE — Progress Notes (Signed)
 Subjective:     Jerry Steele  Patient here for postoperative visit, status post robotic assisted laparoscopic bilateral inguinal herniorrhaphies with mesh.  He states he is doing well.  His initial incisional pain and groin pain have significantly resolved.  He is now voiding without difficulty.  He denies any fever or chills. Objective:    BP 110/75   Pulse 71   Temp 98 F (36.7 C) (Oral)   Resp 12   Ht 6\' 3"  (1.905 m)   Wt 190 lb (86.2 kg)   SpO2 94%   BMI 23.75 kg/m   General:  alert, cooperative, and no distress  Abdomen is soft, incisions healing well.  Testicles are within normal limits.  A small resolving swelling in the right spermatic cord is noted.  He does not have hernias.     Assessment:    Doing well postoperatively.    Plan:   Patient should gradually increase his activity.  May return to work without restrictions on 11/05/2023.  Follow-up here as needed.

## 2023-10-29 ENCOUNTER — Ambulatory Visit (INDEPENDENT_AMBULATORY_CARE_PROVIDER_SITE_OTHER): Admitting: Urology

## 2023-10-29 ENCOUNTER — Encounter: Payer: Self-pay | Admitting: Urology

## 2023-10-29 VITALS — BP 123/83 | HR 60 | Ht 75.0 in | Wt 198.0 lb

## 2023-10-29 DIAGNOSIS — N529 Male erectile dysfunction, unspecified: Secondary | ICD-10-CM

## 2023-10-29 DIAGNOSIS — Z125 Encounter for screening for malignant neoplasm of prostate: Secondary | ICD-10-CM | POA: Diagnosis not present

## 2023-10-29 MED ORDER — SILDENAFIL CITRATE 20 MG PO TABS: ORAL_TABLET | ORAL | 3 refills | Status: DC

## 2023-10-29 NOTE — Progress Notes (Signed)
 Assessment: 1. Erectile dysfunction, unspecified erectile dysfunction type   2. Prostate cancer screening     Plan: PSA today Continue sildenafil 20 mg 3-5 tabs prn I recommended that that he allow more time for his symptoms to improve following his hernia surgery. Return to office in 1 year  Chief Complaint: Chief Complaint  Patient presents with   Erectile Dysfunction    HPI: Jerry Steele is a 58 y.o. male who presents for continued evaluation of erectile dysfunction and prostate cancer screening. He has a long standing erectile dysfunction and was previously seen at [facility].  He was started on sildenafil 20 mg and typically uses 3 with good results.    Patient also had a normal PSA last year of 0.7 as well as a normal testosterone.   Patient reported mild to moderate stable lower urinary tract symptoms. IPSS = 8/2  He returns today for annual follow-up.  He is status post bilateral robotic assisted laparoscopic inguinal herniorrhaphy with mesh on 10/03/2023.  He he reports some discomfort in the right scrotal area.  He also has numbness over the right inguinal area.  He reported some discomfort with urination for approximately 1 week with recent improvement. He continues to use sildenafil 20 mg 3 tablets as needed with good results for his erectile dysfunction.  IPSS = 4/3.  Portions of the above documentation were copied from a prior visit for review purposes only.  Allergies: No Known Allergies  PMH: Past Medical History:  Diagnosis Date   Anginal pain (HCC)    Arthritis of both hands    Asthma    COPD (chronic obstructive pulmonary disease) (HCC)    Dyspnea    Emphysema lung (HCC)    Former smoker    GERD (gastroesophageal reflux disease)    Hyperlipidemia    Pneumonia    Pre-diabetes    Prostate cancer (HCC)     PSH: Past Surgical History:  Procedure Laterality Date   HEMORRHOID SURGERY  1985   x2    SH: Social History    Tobacco Use   Smoking status: Former    Current packs/day: 0.00    Average packs/day: 0.3 packs/day for 30.0 years (7.5 ttl pk-yrs)    Types: Cigarettes    Start date: 12/17/1986    Quit date: 12/16/2016    Years since quitting: 6.8    Passive exposure: Never   Smokeless tobacco: Never  Vaping Use   Vaping status: Never Used  Substance Use Topics   Alcohol use: No    Alcohol/week: 0.0 standard drinks of alcohol   Drug use: No    ROS: Constitutional:  Negative for fever, chills, weight loss CV: Negative for chest pain, previous MI, hypertension Respiratory:  Negative for shortness of breath, wheezing, sleep apnea, frequent cough GI:  Negative for nausea, vomiting, bloody stool, GERD  PE: BP 123/83   Pulse 60   Ht 6\' 3"  (1.905 m)   Wt 198 lb (89.8 kg)   BMI 24.75 kg/m  GENERAL APPEARANCE:  Well appearing, well developed, well nourished, NAD HEENT:  Atraumatic, normocephalic, oropharynx clear NECK:  Supple without lymphadenopathy or thyromegaly ABDOMEN:  Soft, non-tender, no masses EXTREMITIES:  Moves all extremities well, without clubbing, cyanosis, or edema NEUROLOGIC:  Alert and oriented x 3, normal gait, CN II-XII grossly intact MENTAL STATUS:  appropriate BACK:  Non-tender to palpation, No CVAT SKIN:  Warm, dry, and intact GU: Scrotum: No erythema or edema; some tenderness to palpation of the right cord  extending to the inguinal ring. Prostate: 30 g, NT, no nodules Rectum: Normal tone,  no masses or tenderness   Results: None

## 2023-10-30 ENCOUNTER — Encounter: Payer: Self-pay | Admitting: Urology

## 2023-10-30 LAB — PSA: Prostate Specific Ag, Serum: 2.6 ng/mL (ref 0.0–4.0)

## 2023-11-12 ENCOUNTER — Emergency Department (HOSPITAL_COMMUNITY)

## 2023-11-12 ENCOUNTER — Emergency Department (HOSPITAL_COMMUNITY): Admission: EM | Admit: 2023-11-12 | Discharge: 2023-11-13 | Disposition: A | Discharge status: Home / Self Care

## 2023-11-12 ENCOUNTER — Other Ambulatory Visit: Payer: Self-pay

## 2023-11-12 DIAGNOSIS — J4489 Other specified chronic obstructive pulmonary disease: Secondary | ICD-10-CM | POA: Diagnosis not present

## 2023-11-12 DIAGNOSIS — R079 Chest pain, unspecified: Secondary | ICD-10-CM

## 2023-11-12 DIAGNOSIS — R0789 Other chest pain: Secondary | ICD-10-CM | POA: Insufficient documentation

## 2023-11-12 LAB — CBC
HCT: 41.8 % (ref 39.0–52.0)
Hemoglobin: 13.6 g/dL (ref 13.0–17.0)
MCH: 30 pg (ref 26.0–34.0)
MCHC: 32.5 g/dL (ref 30.0–36.0)
MCV: 92.3 fL (ref 80.0–100.0)
Platelets: 171 10*3/uL (ref 150–400)
RBC: 4.53 MIL/uL (ref 4.22–5.81)
RDW: 12.7 % (ref 11.5–15.5)
WBC: 6.9 10*3/uL (ref 4.0–10.5)
nRBC: 0 % (ref 0.0–0.2)

## 2023-11-12 LAB — BASIC METABOLIC PANEL WITH GFR
Anion gap: 6 (ref 5–15)
BUN: 19 mg/dL (ref 6–20)
CO2: 29 mmol/L (ref 22–32)
Calcium: 8.6 mg/dL — ABNORMAL LOW (ref 8.9–10.3)
Chloride: 104 mmol/L (ref 98–111)
Creatinine, Ser: 0.89 mg/dL (ref 0.61–1.24)
GFR, Estimated: >60 mL/min (ref >60)
Glucose, Bld: 91 mg/dL (ref 70–99)
Potassium: 3.7 mmol/L (ref 3.5–5.1)
Sodium: 139 mmol/L (ref 135–145)

## 2023-11-12 LAB — D-DIMER, QUANTITATIVE: D-Dimer, Quant: 0.35 ug{FEU}/mL (ref 0.00–0.50)

## 2023-11-12 LAB — TROPONIN I (HIGH SENSITIVITY): Troponin I (High Sensitivity): 3 ng/L (ref <18)

## 2023-11-12 MED ORDER — IPRATROPIUM-ALBUTEROL 0.5-2.5 (3) MG/3ML IN SOLN
3.0000 mL | Freq: Once | RESPIRATORY_TRACT | Status: AC
  Administered 2023-11-12: 3 mL via RESPIRATORY_TRACT
  Filled 2023-11-12: qty 3

## 2023-11-12 NOTE — ED Triage Notes (Signed)
 Pt presents to ED from Urgent Care. Sts he was told to come to ED d/t concerns for elevated troponin. Pt sts he had a hernia repair 20 days ago. Had sudden right sided arm pain with chest pressure that saw severe. No chest pain not, but sts he does not feel quiet right.

## 2023-11-12 NOTE — ED Provider Notes (Signed)
 Jerry Steele Provider Note   CSN: [id number] Arrival date & time: 11/12/23  1919     History  Chief Complaint  Patient presents with   Chest Pain    Jerry Steele is a 58 y.o. male.  58 year old male presenting emergency department for chest pain.  Symptoms started last night.  Described as sharp.  Center of chest.  Radiating towards right arm.  Some intermittent shortness of breath.  Not currently having symptoms.  Notes palpitations as well.  No lightheadedness dizziness or near syncope.  Recent hernia surgery 20 days ago.   Chest Pain      Home Medications Prior to Admission medications   Medication Sig Start Date End Date Taking? Authorizing Provider  acetaminophen  (TYLENOL ) 500 MG tablet Take 1,000 mg by mouth every 6 (six) hours as needed.    [provider]  albuterol  (VENTOLIN  HFA) 108 (90 Base) MCG/ACT inhaler Inhale 1-2 puffs into the lungs every 6 (six) hours as needed for wheezing or shortness of breath. 03/02/23   Trish Furl, MD  atorvastatin  (LIPITOR) 40 MG tablet Take 1 tablet (40 mg total) by mouth daily. 10/17/22 10/18/23  Jerry Leaven, PA-C  Budeson-Glycopyrrol-Formoterol  (BREZTRI  AEROSPHERE) 160-9-4.8 MCG/ACT AERO Inhale 2 puffs into the lungs in the morning and at bedtime. 08/06/23   Jerry Formica, MD  cetirizine  (ZYRTEC ) 10 MG tablet Take 1 tablet (10 mg total) by mouth daily. 05/03/23   Jerry Dama, NP  ibuprofen  (ADVIL ) 600 MG tablet Take 1 tablet (600 mg total) by mouth every 8 (eight) hours as needed. 03/02/23   Trish Furl, MD  ipratropium-albuterol  (DUONEB) 0.5-2.5 (3) MG/3ML SOLN Take 3 mLs by nebulization every 12 (twelve) hours as needed.    [provider]  omeprazole  (PRILOSEC ) 20 MG capsule Take 1 capsule (20 mg total) by mouth 2 (two) times daily before a meal. 11/07/22   Jerry Dama, NP  oxyCODONE  (ROXICODONE ) 5 MG immediate release tablet Take 1 tablet (5 mg total) by mouth every 4  (four) hours as needed. 10/12/23 10/11/24  Jerry Allegra, MD  sildenafil  (REVATIO ) 20 MG tablet TAKE 2 TO 5 TABLETS BY MOUTH 1 TO 2 HOURS PRIOR TO SEXUAL ACTIVITY ON AN EMPTY STOMACH 10/29/23   Steele, Jerry Brisker., MD  triamcinolone  (KENALOG ) 0.025 % ointment Apply 1 Application topically 2 (two) times daily. 01/24/23   Jerry Dama, NP      Allergies    Patient has no known allergies.    Review of Systems   Review of Systems  Cardiovascular:  Positive for chest pain.    Physical Exam Updated Vital Signs BP 103/87   Pulse 72   Temp 98.2 F (36.8 C) (Oral)   Resp (!) 23   SpO2 100%  Physical Exam Vitals and nursing note reviewed.  Constitutional:      General: He is not in acute distress.    Appearance: He is not toxic-appearing.  Cardiovascular:     Rate and Rhythm: Normal rate and regular rhythm.     Pulses:          Radial pulses are 2+ on the right side and 2+ on the left side.     Heart sounds: Normal heart sounds.  Pulmonary:     Effort: Pulmonary effort is normal.     Breath sounds: Normal breath sounds. No wheezing.  Abdominal:     General: Abdomen is flat. There is no distension.     Palpations:  Abdomen is soft.     Tenderness: There is no abdominal tenderness. There is no guarding or rebound.  Musculoskeletal:     Right lower leg: No edema.     Left lower leg: No edema.  Skin:    General: Skin is warm and dry.  Neurological:     General: No focal deficit present.     Mental Status: He is alert.  Psychiatric:        Mood and Affect: Mood normal.        Behavior: Behavior normal.     ED Results / Procedures / Treatments   Labs (all labs ordered are listed, but only abnormal results are displayed) Labs Reviewed  BASIC METABOLIC PANEL WITH GFR - Abnormal; Notable for the following components:      Result Value   Calcium  8.6 (*)    All other components within normal limits  CBC  D-DIMER, QUANTITATIVE  TROPONIN I (HIGH SENSITIVITY)  TROPONIN I (HIGH  SENSITIVITY)    EKG EKG Interpretation Date/Time:  Monday November 12 2023 19:53:07 EDT Ventricular Rate:  65 PR Interval:  165 QRS Duration:  115 QT Interval:  386 QTC Calculation: 402 R Axis:   79  Text Interpretation: Sinus rhythm Incomplete right bundle branch block ST elevation, consider anterior injury Confirmed by Jerry Steele [phone]) on 11/12/2023 8:06:22 PM  Radiology DG Chest 2 View Result Date: 11/12/2023 CLINICAL DATA:  Chest pain. EXAM: CHEST - 2 VIEW COMPARISON:  March 02, 2023 FINDINGS: The heart size and mediastinal contours are within normal limits. Both lungs are clear. The visualized skeletal structures are unremarkable. IMPRESSION: No active cardiopulmonary disease. Electronically Signed   By: Virgle Grime M.D.   On: 11/12/2023 20:17    Procedures Procedures    Medications Ordered in ED Medications  ipratropium-albuterol  (DUONEB) 0.5-2.5 (3) MG/3ML nebulizer solution 3 mL (3 mLs Nebulization Given 11/12/23 2313)    ED Course/ Medical Decision Making/ A&P Clinical Course as of 11/12/23 2344  Mon Nov 12, 2023  2239 D-Dimer, Quant: 0.35 PE less likely.  [TY]    Clinical Course User Index [TY] Jerry Climes, DO                                 Medical Decision Making This is a 58 year old male with history of asthma/COPD not on oxygen presented to the emergency department for chest pain, per chart review no prior cardiac testing history.  Per review had mildly elevated troponin at urgent care at 27.  Initial troponin here negative at 3.  EKG on my depend interpretation without ST segment changes that would indicate overt ischemia.  He is not actively having chest pain.  Concern for possible PE given recent surgery.  However D-dimer negative which makes PE less likely.  Awaiting delta troponin.  He was given a breathing treatment here with improvement of symptoms as well.  There may be a reactive airway component. Care signed out to overnight team.    Amount and/or Complexity of Data Reviewed Independent Historian:     Details: Family member at bedside notes does not have a cardiologist. External Data Reviewed:     Details: No prior cardiac evaluation. Labs: ordered. Decision-making details documented in ED Course. Radiology: ordered and independent interpretation performed.    Details: Chest x-ray without pneumonia pneumothorax ECG/medicine tests: independent interpretation performed.  Risk Prescription drug management. Decision regarding hospitalization. Diagnosis or treatment significantly limited  by social determinants of health. Risk Details: Poor health literacy          Final Clinical Impression(s) / ED Diagnoses Final diagnoses:  Chest pain, unspecified type    Rx / DC Orders ED Discharge Orders          Ordered    Ambulatory referral to Cardiology       Comments: If you have not heard from the Cardiology office within the next 72 hours please call [phone].   11/12/23 2339              Jerry Climes, DO 11/12/23 2344

## 2023-11-12 NOTE — Discharge Instructions (Addendum)
 Please follow-up with the cardiologist.  We have given you a referral, they should call you to schedule an appointment.  If you do not hear from them in the next 24 to 48 hours, please contact their office to schedule an appointment.  Return immediately if develop fevers, chills, lightheadedness, passout, worsening chest pain, worsening shortness of breath, or any new or worsening symptoms that are concerning to you.

## 2023-11-13 LAB — TROPONIN I (HIGH SENSITIVITY): Troponin I (High Sensitivity): 3 ng/L (ref <18)

## 2023-11-13 NOTE — ED Provider Notes (Signed)
 Repeat troponin negative.  Patient stable no acute distress.  I have reviewed the EKG and labs.  Patient safe for discharge   Eldon Greenland, MD 11/13/23 0025

## 2023-11-13 NOTE — ED Notes (Signed)
 ED Provider at bedside.

## 2023-11-20 ENCOUNTER — Other Ambulatory Visit: Payer: Self-pay | Admitting: *Deleted

## 2023-11-20 ENCOUNTER — Telehealth: Payer: Self-pay | Admitting: Family

## 2023-11-20 NOTE — Addendum Note (Signed)
 Addended by: Lavera Postal on: 11/20/2023 12:37 PM   Modules accepted: Orders

## 2023-11-20 NOTE — Telephone Encounter (Signed)
 Copied from CRM [id number]. Topic: Clinical - Medication Refill >> Nov 20, 2023 12:05 PM Essie A wrote: Most Recent Primary Care Visit:  Provider: Lavona Pounds J  Department: [facility]  Visit Type: OFFICE VISIT  Date: 08/14/2023  Medication: Budeson-Glycopyrrol-Formoterol  (BREZTRI  AEROSPHERE) 160-9-4.8 MCG/ACT AERO (was discontinued) ipratropium-albuterol  (DUONEB) 0.5-2.5 (3) MG/3ML SOLN (discontinued)  Has the patient contacted their pharmacy? No (Agent: If no, request that the patient contact the pharmacy for the refill. If patient does not wish to contact the pharmacy document the reason why and proceed with request.) (Agent: If yes, when and what did the pharmacy advise?)  Is this the correct pharmacy for this prescription? Yes If no, delete pharmacy and type the correct one.  This is the patient's preferred pharmacy:  [facility] [address], [place] - [address] [place] [address] Phone: [phone] Fax: [phone]   Has the prescription been filled recently? No  Is the patient out of the medication? No  Has the patient been seen for an appointment in the last year OR does the patient have an upcoming appointment? Yes  Can we respond through MyChart? No  Agent: Please be advised that Rx refills may take up to 3 business days. We ask that you follow-up with your pharmacy.

## 2023-11-20 NOTE — Telephone Encounter (Signed)
 New refill encounter created

## 2023-11-20 NOTE — Telephone Encounter (Signed)
 Received call from patient son, Hilario Lover [phone]~ telephone.  Requested refill on Breztri  and Ipatropium. Advised to contact PCP for refills of medications.

## 2023-11-21 NOTE — Telephone Encounter (Signed)
 Patient established with [facility] request refills from the same.

## 2023-11-22 NOTE — Telephone Encounter (Signed)
 I called patient for recommendation about medication no one answered so I left a voicemail to return my call.

## 2023-11-26 ENCOUNTER — Telehealth: Payer: Self-pay | Admitting: Family

## 2023-11-26 NOTE — Telephone Encounter (Signed)
 Patient calling in returning a call from the office. Patient isn't sure who called him.

## 2023-11-26 NOTE — Telephone Encounter (Signed)
 I called patient for recommendation about medication no one answered so I left a voicemail to return my call.

## 2023-11-26 NOTE — Telephone Encounter (Unsigned)
 Copied from CRM [id number]. Topic: Clinical - Medication Question >> Nov 26, 2023  1:16 PM Rachelle R wrote: Reason for CRM: Patient received at voicemail asking him to call back in regards to his medication request. Call CAL but Traundra not available at the moment. Patients son Hilario Lover requesting a callback.  Hilario Lover can be reached at [phone]

## 2023-11-27 ENCOUNTER — Ambulatory Visit: Payer: 59 | Admitting: Urology

## 2023-11-28 ENCOUNTER — Ambulatory Visit: Payer: Self-pay | Admitting: Cardiology

## 2023-11-28 NOTE — Telephone Encounter (Signed)
 Please return patient's call for additional details and notify me if I can further assist. Thank you.

## 2023-11-30 ENCOUNTER — Encounter: Payer: Self-pay | Admitting: Family

## 2023-11-30 ENCOUNTER — Ambulatory Visit (INDEPENDENT_AMBULATORY_CARE_PROVIDER_SITE_OTHER): Payer: Self-pay | Admitting: Family

## 2023-11-30 VITALS — BP 117/79 | HR 84 | Temp 97.7°F | Resp 16 | Ht 75.0 in | Wt 189.8 lb

## 2023-11-30 DIAGNOSIS — Z139 Encounter for screening, unspecified: Secondary | ICD-10-CM

## 2023-11-30 DIAGNOSIS — G43909 Migraine, unspecified, not intractable, without status migrainosus: Secondary | ICD-10-CM

## 2023-11-30 DIAGNOSIS — Z5986 Financial insecurity: Secondary | ICD-10-CM

## 2023-11-30 MED ORDER — SUMATRIPTAN SUCCINATE 25 MG PO TABS: ORAL_TABLET | ORAL | 2 refills | Status: AC

## 2023-11-30 NOTE — Progress Notes (Signed)
 Numbness in right side groin area, patient wants a n inhaler and neb solution, coughing dry, pain in head

## 2023-11-30 NOTE — Progress Notes (Signed)
 Patient ID: Jerry Steele, male    DOB: [birth year]  MRN: [id number]  CC: Headaches  Subjective: Jerry Steele is a 58 y.o. male who presents for headaches.   His concerns today include:  - Intermittent headaches. Denies red flag symptoms. Taking over-the-counter medications with minimal relief. He would like to see a specialist.  - Established with Cardiology.  - Established with Pulmonology.  - Established with surgeon for hernia management. - States challenges with housing and food.  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 (Patient not taking: Reported on 11/30/2023)     albuterol  (VENTOLIN  HFA) 108 (90 Base) MCG/ACT inhaler Inhale 1-2 puffs into the lungs every 6 (six) hours as needed for wheezing or shortness of breath. (Patient not taking: Reported on 11/30/2023) 6.7 g 0   atorvastatin  (LIPITOR) 40 MG tablet Take 1 tablet (40 mg total) by mouth daily. 30 tablet 2   cetirizine  (ZYRTEC ) 10 MG tablet Take 1 tablet (10 mg total) by mouth daily. (Patient not taking: Reported on 11/30/2023) 30 tablet 1    ibuprofen  (ADVIL ) 600 MG tablet Take 1 tablet (600 mg total) by mouth every 8 (eight) hours as needed. (Patient taking differently: Take 600 mg by mouth every 8 (eight) hours as needed for mild pain (pain score 1-3) or moderate pain (pain score 4-6).) 21 tablet 0   oxyCODONE  (ROXICODONE ) 5 MG immediate release tablet Take 1 tablet (5 mg total) by mouth every 4 (four) hours as needed. (Patient taking differently: Take 5 mg by mouth every 4 (four) hours as needed for moderate pain (pain score 4-6) or severe pain (pain score 7-10).) 20 tablet 0   sildenafil  (REVATIO ) 20 MG tablet TAKE 2 TO 5 TABLETS BY MOUTH 1 TO 2 HOURS PRIOR TO SEXUAL ACTIVITY ON AN EMPTY STOMACH (Patient taking differently: Take 40-100 mg by mouth See admin instructions. TAKE 2 TO 5 TABLETS BY MOUTH 1 TO 2 HOURS PRIOR TO SEXUAL ACTIVITY ON AN EMPTY STOMACH) 90 tablet 3   triamcinolone  (KENALOG ) 0.025 % ointment Apply 1 Application topically 2 (two) times daily. (Patient not taking: Reported on 11/30/2023) 60 g 0   No current facility-administered medications on file prior to visit.    No Known Allergies  Social History   Socioeconomic History   Marital status: Divorced    Spouse name: Not on file   Number of children: Not on file   Years of education: Not on file   Highest education level: Not on file  Occupational History   Not on file  Tobacco Use   Smoking status: Former    Current packs/day: 0.00  Average packs/day: 0.3 packs/day for 30.0 years (7.5 ttl pk-yrs)    Types: Cigarettes    Start date: 12/17/1986    Quit date: 12/16/2016    Years since quitting: 6.9    Passive exposure: Never   Smokeless tobacco: Never  Vaping Use   Vaping status: Never Used  Substance and Sexual Activity   Alcohol use: No    Alcohol/week: 0.0 standard drinks of alcohol   Drug use: No   Sexual activity: Yes  Other Topics Concern   Not on file  Social History Narrative   Lives with son   Social Drivers of Health   Financial  Resource Strain: Medium Risk (11/30/2023)   Overall Financial Resource Strain (CARDIA)    Difficulty of Paying Living Expenses: Somewhat hard  Food Insecurity: Food Insecurity Present (11/30/2023)   Hunger Vital Sign    Worried About Running Out of Food in the Last Year: Sometimes true    Ran Out of Food in the Last Year: Sometimes true  Transportation Needs: No Transportation Needs (11/30/2023)   PRAPARE - Administrator, Civil Service (Medical): No    Lack of Transportation (Non-Medical): No  Physical Activity: Sufficiently Active (11/30/2023)   Exercise Vital Sign    Days of Exercise per Week: 5 days    Minutes of Exercise per Session: 30 min  Stress: No Stress Concern Present (11/30/2023)   [employer] of Occupational Health - Occupational Stress Questionnaire    Feeling of Stress : Not at all  Social Connections: Socially Isolated (11/30/2023)   Social Connection and Isolation Panel [NHANES]    Frequency of Communication with Friends and Family: Once a week    Frequency of Social Gatherings with Friends and Family: Once a week    Attends Religious Services: More than 4 times per year    Active Member of [employer] or Organizations: No    Attends Banker Meetings: Never    Marital Status: Divorced  Catering manager Violence: Not At Risk (11/30/2023)   Humiliation, Afraid, Rape, and Kick questionnaire    Fear of Current or Ex-Partner: No    Emotionally Abused: No    Physically Abused: No    Sexually Abused: No    Family History  Problem Relation Age of Onset   Sudden Cardiac Death Brother 43   Colon cancer Neg Hx    Colon polyps Neg Hx    Esophageal cancer Neg Hx    Rectal cancer Neg Hx    Stomach cancer Neg Hx     Past Surgical History:  Procedure Laterality Date   HEMORRHOID SURGERY  1985   x2    ROS: Review of Systems Negative except as stated above  PHYSICAL EXAM: BP 117/79   Pulse 84   Temp 97.7 F (36.5 C) (Oral)   Resp 16   Ht 6'  3" (1.905 m)   Wt 189 lb 12.8 oz (86.1 kg)   SpO2 96%   BMI 23.72 kg/m   Physical Exam HENT:     Head: Normocephalic and atraumatic.     Nose: Nose normal.     Mouth/Throat:     Mouth: Mucous membranes are moist.     Pharynx: Oropharynx is clear.  Eyes:     Extraocular Movements: Extraocular movements intact.     Conjunctiva/sclera: Conjunctivae normal.     Pupils: Pupils are equal, round, and reactive to light.  Cardiovascular:     Rate and Rhythm: Normal rate and regular rhythm.  Pulses: Normal pulses.     Heart sounds: Normal heart sounds.  Pulmonary:     Effort: Pulmonary effort is normal.     Breath sounds: Normal breath sounds.  Musculoskeletal:        General: Normal range of motion.     Cervical back: Normal range of motion and neck supple.  Neurological:     General: No focal deficit present.     Mental Status: He is alert and oriented to person, place, and time.  Psychiatric:        Mood and Affect: Mood normal.        Behavior: Behavior normal.    ASSESSMENT AND PLAN: 1. Migraine without status migrainosus, not intractable, unspecified migraine type (Primary) - Sumatriptan as prescribed. Counseled on medication adherence/adverse effects. - Referral to Neurology for evaluation/management.  - Follow-up with primary provider as scheduled. - SUMAtriptan (IMITREX) 25 MG tablet; Take 25 mg (1 tablet total) by mouth at the start of the headache. May repeat in 2 hours x 1 if headache persists. Max of 2 tablets/24 hours.  Dispense: 30 tablet; Refill: 2 - Ambulatory referral to Neurology  2. Encounter for screening involving social determinants of health (SDoH) - [facility] to [facility] for community resources. - AMB Referral [facility]   Patient was given the opportunity to ask questions.  Patient verbalized understanding of the plan and was able to repeat key elements of the plan. Patient was given clear instructions to go to Emergency  Department or return to medical center if symptoms don't improve, worsen, or new problems develop.The patient verbalized understanding.   Orders Placed This Encounter  Procedures   AMB Referral [facility] Care Management   Ambulatory referral to Neurology     Requested Prescriptions   Signed Prescriptions Disp Refills   SUMAtriptan (IMITREX) 25 MG tablet 30 tablet 2    Sig: Take 25 mg (1 tablet total) by mouth at the start of the headache. May repeat in 2 hours x 1 if headache persists. Max of 2 tablets/24 hours.    Follow-up with primary provider as scheduled.  Senaida Dama, NP

## 2023-12-03 ENCOUNTER — Encounter: Payer: Self-pay | Admitting: Neurology

## 2023-12-03 NOTE — Telephone Encounter (Signed)
 I left msg for patient to get medication from his pulmonary office per pcp.

## 2023-12-03 NOTE — Telephone Encounter (Signed)
 I have attempted without success to contact this patient by phone to return their call and I left a message on answering machine.

## 2023-12-05 ENCOUNTER — Encounter: Payer: Self-pay | Admitting: Neurology

## 2023-12-05 ENCOUNTER — Ambulatory Visit: Payer: Self-pay | Admitting: Neurology

## 2023-12-05 DIAGNOSIS — Z029 Encounter for administrative examinations, unspecified: Secondary | ICD-10-CM

## 2023-12-05 NOTE — Progress Notes (Deleted)
 NEUROLOGY CONSULTATION NOTE  Jerry Steele MRN: [id number] DOB: 1966-03-02  Referring provider: Lavona Pounds, NP Primary care provider: Lavona Pounds, NP  Reason for consult:  headache  Assessment/Plan:   ***   Subjective:  Jerry Steele is a 58 year old ***-handed male with COPD, emphysemia, HLD and history of prostate cancer who presents for headache.  History supplemented by referring provider's note.  Onset:  *** Location:  *** Quality:  *** Intensity:  ***.  *** denies new headache, thunderclap headache or severe headache that wakes *** from sleep. Aura:  *** Prodrome:  *** Postdrome:  *** Associated symptoms:  ***.  *** denies associated unilateral numbness or weakness. Duration:  *** Frequency:  *** Frequency of abortive medication: *** Triggers:  *** Relieving factors:  *** Activity:  ***  Remote history of CT head to evaluate headache from 01/08/2009 personally reviewed and was normal.  Past NSAIDS/analgesics:  Tylenol , meloxicam , tramadol  Past abortive triptans:  *** Past abortive ergotamine:  *** Past muscle relaxants:  Flexeril  Past anti-emetic:  *** Past antihypertensive medications:  *** Past antidepressant medications:  *** Past anticonvulsant medications:  gabapentin  Past anti-CGRP:  *** Past vitamins/Herbal/Supplements:  *** Past antihistamines/decongestants:  Zyrtec , Flonase , meclizine  Other past therapies:  ***  Current NSAIDS/analgesics:  ibuprofen  600mg , oxycodone  (***) Current triptans:  sumatriptan  25mg  Current ergotamine:  *** Current anti-emetic:  *** Current muscle relaxants:  *** Current Antihypertensive medications:  *** Current Antidepressant medications:  *** Current Anticonvulsant medications:  *** Current anti-CGRP:  *** Current Vitamins/Herbal/Supplements:  *** Current Antihistamines/Decongestants:  *** Other therapy:  *** Birth control:  *** Other medications:  sildenafil    Caffeine :  *** Alcohol:  *** Smoker:   *** Diet:  *** Exercise:  *** Depression:  ***; Anxiety:  *** Other pain:  *** Sleep hygiene:  *** Family history of headache:  ***      PAST MEDICAL HISTORY: Past Medical History:  Diagnosis Date   Anginal pain (HCC)    Arthritis of both hands    Asthma    COPD (chronic obstructive pulmonary disease) (HCC)    Dyspnea    Emphysema lung (HCC)    Former smoker    GERD (gastroesophageal reflux disease)    Hyperlipidemia    Pneumonia    Pre-diabetes    Prostate cancer (HCC)     PAST SURGICAL HISTORY: Past Surgical History:  Procedure Laterality Date   HEMORRHOID SURGERY  1985   x2    MEDICATIONS: Current Outpatient Medications on File Prior to Visit  Medication Sig Dispense Refill   acetaminophen  (TYLENOL ) 500 MG tablet Take 1,000 mg by mouth every 6 (six) hours as needed for mild pain (pain score 1-3) or moderate pain (pain score 4-6). (Patient not taking: Reported on 11/30/2023)     albuterol  (VENTOLIN  HFA) 108 (90 Base) MCG/ACT inhaler Inhale 1-2 puffs into the lungs every 6 (six) hours as needed for wheezing or shortness of breath. (Patient not taking: Reported on 11/30/2023) 6.7 g 0   atorvastatin  (LIPITOR) 40 MG tablet Take 1 tablet (40 mg total) by mouth daily. 30 tablet 2   cetirizine  (ZYRTEC ) 10 MG tablet Take 1 tablet (10 mg total) by mouth daily. (Patient not taking: Reported on 11/30/2023) 30 tablet 1   ibuprofen  (ADVIL ) 600 MG tablet Take 1 tablet (600 mg total) by mouth every 8 (eight) hours as needed. (Patient taking differently: Take 600 mg by mouth every 8 (eight) hours as needed for mild pain (pain score 1-3) or moderate pain (pain score  4-6).) 21 tablet 0   oxyCODONE  (ROXICODONE ) 5 MG immediate release tablet Take 1 tablet (5 mg total) by mouth every 4 (four) hours as needed. (Patient taking differently: Take 5 mg by mouth every 4 (four) hours as needed for moderate pain (pain score 4-6) or severe pain (pain score 7-10).) 20 tablet 0   sildenafil  (REVATIO ) 20  MG tablet TAKE 2 TO 5 TABLETS BY MOUTH 1 TO 2 HOURS PRIOR TO SEXUAL ACTIVITY ON AN EMPTY STOMACH (Patient taking differently: Take 40-100 mg by mouth See admin instructions. TAKE 2 TO 5 TABLETS BY MOUTH 1 TO 2 HOURS PRIOR TO SEXUAL ACTIVITY ON AN EMPTY STOMACH) 90 tablet 3   SUMAtriptan  (IMITREX ) 25 MG tablet Take 25 mg (1 tablet total) by mouth at the start of the headache. May repeat in 2 hours x 1 if headache persists. Max of 2 tablets/24 hours. 30 tablet 2   triamcinolone  (KENALOG ) 0.025 % ointment Apply 1 Application topically 2 (two) times daily. (Patient not taking: Reported on 11/30/2023) 60 g 0   No current facility-administered medications on file prior to visit.    ALLERGIES: No Known Allergies  FAMILY HISTORY: Family History  Problem Relation Age of Onset   Sudden Cardiac Death Brother 50   Colon cancer Neg Hx    Colon polyps Neg Hx    Esophageal cancer Neg Hx    Rectal cancer Neg Hx    Stomach cancer Neg Hx     Objective:  *** General: No acute distress.  Patient appears well-groomed.   Head:  Normocephalic/atraumatic Eyes:  fundi examined but not visualized Neck: supple, no paraspinal tenderness, full range of motion Back: No paraspinal tenderness Heart: regular rate and rhythm Lungs: Clear to auscultation bilaterally. Vascular: No carotid bruits. Neurological Exam: Mental status: alert and oriented to person, place, and time, speech fluent and not dysarthric, language intact. Cranial nerves: CN I: not tested CN II: pupils equal, round and reactive to light, visual fields intact CN III, IV, VI:  full range of motion, no nystagmus, no ptosis CN V: facial sensation intact. CN VII: upper and lower face symmetric CN VIII: hearing intact CN IX, X: gag intact, uvula midline CN XI: sternocleidomastoid and trapezius muscles intact CN XII: tongue midline Bulk & Tone: normal, no fasciculations. Motor:  muscle strength 5/5 throughout Sensation:  Pinprick, temperature  and vibratory sensation intact. Deep Tendon Reflexes:  2+ throughout,  toes downgoing.   Finger to nose testing:  Without dysmetria.   Heel to shin:  Without dysmetria.   Gait:  Normal station and stride.  Romberg negative.    Thank you for allowing me to take part in the care of this patient.  Janne Members, DO  CC: ***

## 2023-12-14 ENCOUNTER — Telehealth: Payer: Self-pay | Admitting: *Deleted

## 2023-12-14 NOTE — Progress Notes (Signed)
 Complex Care Management Note  Care Guide Note 12/14/2023 Name: Jerry Steele MRN: [id number] DOB: [birth year]  Jerry Steele is a 58 y.o. year old male who sees Senaida Dama, NP for primary care. I reached out to Travaris Bunyard by phone today to offer complex care management services.  [name] was given information about Complex Care Management services today including:   The Complex Care Management services include support from the care team which includes your Nurse Care Manager, Clinical Social Worker, or Pharmacist.  The Complex Care Management team is here to help remove barriers to the health concerns and goals most important to you. Complex Care Management services are voluntary, and the patient may decline or stop services at any time by request to their care team member.   Complex Care Management Consent Status: Patient agreed to services and verbal consent obtained.   Follow up plan:  Telephone appointment with complex care management team member scheduled for:  6/5  Encounter Outcome:  Patient Scheduled  Barnie Bora  [facility]  [facility], [facility] Guide  Direct Dial: [phone]  Fax [phone]

## 2023-12-17 ENCOUNTER — Telehealth: Payer: Self-pay | Admitting: *Deleted

## 2023-12-17 NOTE — Progress Notes (Signed)
 Complex Care Management Note Care Guide Note  12/17/2023 Name: Amonte Brookover MRN: [id number] DOB: May 15, 1966  Lloyde Ludlam is a 58 y.o. year old male who is a primary care patient of Senaida Dama, NP . The community resource team was consulted for assistance with Food Insecurity  SDOH screenings and interventions completed:  Yes  Social Drivers of Health From This Encounter   Food Insecurity: Food Insecurity Present (12/17/2023)   Hunger Vital Sign    Worried About Running Out of Food in the Last Year: Often true    Ran Out of Food in the Last Year: Sometimes true    SDOH Interventions Today    Flowsheet Row Most Recent Value  SDOH Interventions   Food Insecurity Interventions AMB Referral, Community Resources Provided, [id number] Referral  [i have Provided food bank and referral]  Housing Interventions Intervention Not Indicated  [patient presently not paying rent but ok]        Care guide performed the following interventions: Patient provided with information about care guide support team and interviewed to confirm resource needs.  Follow Up Plan:  No further follow up planned at this time. The patient has been provided with needed resources.  Encounter Outcome:  Patient Visit Completed  Kairen Hallinan Greenauer-Moran  [facility] Health[facility] Care Guide  Direct Dial:[phone] Fax:[phone] Website: [email or web address]

## 2023-12-19 ENCOUNTER — Telehealth: Payer: Self-pay | Admitting: *Deleted

## 2023-12-19 ENCOUNTER — Encounter: Payer: Self-pay | Admitting: Nurse Practitioner

## 2023-12-19 ENCOUNTER — Ambulatory Visit: Payer: Self-pay | Admitting: Nurse Practitioner

## 2023-12-19 VITALS — BP 126/80 | HR 86 | Ht 75.0 in | Wt 171.6 lb

## 2023-12-19 DIAGNOSIS — J4489 Other specified chronic obstructive pulmonary disease: Secondary | ICD-10-CM

## 2023-12-19 DIAGNOSIS — J4541 Moderate persistent asthma with (acute) exacerbation: Secondary | ICD-10-CM

## 2023-12-19 MED ORDER — BREZTRI AEROSPHERE 160-9-4.8 MCG/ACT IN AERO: 2.0000 | INHALATION_SPRAY | Freq: Two times a day (BID) | RESPIRATORY_TRACT | 11 refills | Status: DC

## 2023-12-19 MED ORDER — PREDNISONE 10 MG PO TABS: ORAL_TABLET | ORAL | 0 refills | Status: AC

## 2023-12-19 MED ORDER — METHYLPREDNISOLONE ACETATE 80 MG/ML IJ SUSP
80.0000 mg | Freq: Once | INTRAMUSCULAR | Status: AC
  Administered 2023-12-19: 80 mg via INTRAMUSCULAR

## 2023-12-19 MED ORDER — ALBUTEROL SULFATE HFA 108 (90 BASE) MCG/ACT IN AERS: 1.0000 | INHALATION_SPRAY | Freq: Four times a day (QID) | RESPIRATORY_TRACT | 2 refills | Status: DC | PRN

## 2023-12-19 MED ORDER — ALBUTEROL SULFATE (2.5 MG/3ML) 0.083% IN NEBU: 2.5000 mg | INHALATION_SOLUTION | Freq: Four times a day (QID) | RESPIRATORY_TRACT | 5 refills | Status: DC | PRN

## 2023-12-19 MED ORDER — IPRATROPIUM-ALBUTEROL 0.5-2.5 (3) MG/3ML IN SOLN
3.0000 mL | Freq: Once | RESPIRATORY_TRACT | Status: AC
  Administered 2023-12-19: 3 mL via RESPIRATORY_TRACT

## 2023-12-19 NOTE — Telephone Encounter (Signed)
 Called and spoke with patient, he states he is not sick or having any symptoms.  He says he has a breztri  inhaler and albuterol  inhaler, however he needs to have his nebulizer solution refilled.  He could not tell me what nebulizer solution it was, however, Duoneb is on a previous medication list.  He will bring the box with him.  He says he was given this medication in the ER and it really helped.  He has an appointment with Katie at 4 pm.  Nothing further needed.

## 2023-12-19 NOTE — Patient Instructions (Addendum)
 Restart Albuterol  inhaler 2 puffs or 3 mL neb every 6 hours as needed for shortness of breath or wheezing. Notify if symptoms persist despite rescue inhaler/neb use.  Restart Breztri  2 puffs Twice daily. Brush tongue and rinse mouth afterwards  Continue cetirizine  (Zyrtec ) 1 tab daily  Prednisone  taper. 4 tabs for 2 days, then 3 tabs for 2 days, 2 tabs for 2 days, then 1 tab for 2 days, then stop. Take in AM with food. Start tomorrow Steroid shot today   Follow up in 6-8 weeks with Dr. Waymond Hailey or Katie Roshini Fulwider,NP. If symptoms do not improve or worsen, please contact office for sooner follow up or seek emergency care.

## 2023-12-19 NOTE — Progress Notes (Signed)
 @Patient  ID: Jerry Steele, male    DOB: 1966/05/03, 58 y.o.   MRN: [id number]  Chief Complaint  Patient presents with   Acute Visit    Patient states he's coughing up phelgm. Started few months ago.    Referring provider: Senaida Dama, NP  HPI: 58 year old male, former smoker followed for asthmatic bronchitis and emphysema.  He is a patient of Dr. Jacqui Mau and last seen in office 08/06/2023.  Past medical history significant for HLD, prediabetes, nasal septal deviation.  TEST/EVENTS:  05/07/2017 CT chest: Minimal biapical pleural-parenchymal scarring.  Atherosclerosis. 05/23/2017 PFT: FVC 61, FEV1 59, ratio 76, TLC 75, DLCO 64.  Positive bronchodilator response 02/2023 eos 400 11/12/2023 CXR: Clear lungs  08/06/2023: OV with Dr. Waymond Hailey.  Pulmonary consult for cough and shortness of breath.  Previously seen in our office in 2018.  4 months prior to OV started having cough and shortness of breath.  Treated with prednisone  and felt better.  Downhill since then.  Very confused with all his meds and does not recommend his meds on his current list.  Unclear what he is taking and not taking.  Has a history of obstruction in his airflow on previous PFT.  Favors asthmatic bronchitis.  Advised on more aggressive GERD regimen.  Treated with Depo injection.  Started on Breztri .  12/19/2023: Today-acute Discussed the use of AI scribe software for clinical note transcription with the patient, who gave verbal consent to proceed.  History of Present Illness   Jerry Steele is a 58 year old male who presents with difficulty managing his respiratory symptoms.  He was last seen in January and was prescribed the Breztri  inhaler for his chronic cough. He found the inhaler effective in managing his symptoms. Cough was mostly gone. No issues with his breathing or chest tightness. He was also using duonebs a few times a week, which were helping as well. HE has been out of it for three to four months and has been  unable to obtain more. Symptoms has returned since then.   Unsure if he has a rescue inhaler.   Cough is productive with clear to white phlegm. Feels short winded with long distances or uphill climbing. Does notice some wheezing. He has not experienced fevers, chills, or hemoptysis.      No Known Allergies  Immunization History  Administered Date(s) Administered   Influenza, Seasonal, Injecte, Preservative Fre 05/03/2023   Influenza,inj,Quad PF,6+ Mos 04/13/2022   Influenza-Unspecified 06/17/2023   PFIZER(Purple Top)SARS-COV-2 Vaccination 02/17/2020, 03/09/2020   Tdap 01/04/2021   Zoster Recombinant(Shingrix ) 01/04/2021, 08/14/2023    Past Medical History:  Diagnosis Date   Anginal pain (HCC)    Arthritis of both hands    Asthma    COPD (chronic obstructive pulmonary disease) (HCC)    Dyspnea    Emphysema lung (HCC)    Former smoker    GERD (gastroesophageal reflux disease)    Hyperlipidemia    Pneumonia    Pre-diabetes    Prostate cancer (HCC)     Tobacco History: Social History   Tobacco Use  Smoking Status Former   Current packs/day: 0.00   Average packs/day: 0.3 packs/day for 30.0 years (7.5 ttl pk-yrs)   Types: Cigarettes   Start date: 12/17/1986   Quit date: 12/16/2016   Years since quitting: 7.0   Passive exposure: Never  Smokeless Tobacco Never   Counseling given: Not Answered   Outpatient Medications Prior to Visit  Medication Sig Dispense Refill   acetaminophen  (TYLENOL ) 500 MG  tablet Take 1,000 mg by mouth every 6 (six) hours as needed for mild pain (pain score 1-3) or moderate pain (pain score 4-6). (Patient not taking: Reported on 11/30/2023)     atorvastatin  (LIPITOR) 40 MG tablet Take 1 tablet (40 mg total) by mouth daily. 30 tablet 2   cetirizine  (ZYRTEC ) 10 MG tablet Take 1 tablet (10 mg total) by mouth daily. (Patient not taking: Reported on 11/30/2023) 30 tablet 1   ibuprofen  (ADVIL ) 600 MG tablet Take 1 tablet (600 mg total) by mouth every 8  (eight) hours as needed. (Patient taking differently: Take 600 mg by mouth every 8 (eight) hours as needed for mild pain (pain score 1-3) or moderate pain (pain score 4-6).) 21 tablet 0   oxyCODONE  (ROXICODONE ) 5 MG immediate release tablet Take 1 tablet (5 mg total) by mouth every 4 (four) hours as needed. (Patient taking differently: Take 5 mg by mouth every 4 (four) hours as needed for moderate pain (pain score 4-6) or severe pain (pain score 7-10).) 20 tablet 0   sildenafil  (REVATIO ) 20 MG tablet TAKE 2 TO 5 TABLETS BY MOUTH 1 TO 2 HOURS PRIOR TO SEXUAL ACTIVITY ON AN EMPTY STOMACH (Patient taking differently: Take 40-100 mg by mouth See admin instructions. TAKE 2 TO 5 TABLETS BY MOUTH 1 TO 2 HOURS PRIOR TO SEXUAL ACTIVITY ON AN EMPTY STOMACH) 90 tablet 3   SUMAtriptan  (IMITREX ) 25 MG tablet Take 25 mg (1 tablet total) by mouth at the start of the headache. May repeat in 2 hours x 1 if headache persists. Max of 2 tablets/24 hours. 30 tablet 2   triamcinolone  (KENALOG ) 0.025 % ointment Apply 1 Application topically 2 (two) times daily. (Patient not taking: Reported on 11/30/2023) 60 g 0   albuterol  (VENTOLIN  HFA) 108 (90 Base) MCG/ACT inhaler Inhale 1-2 puffs into the lungs every 6 (six) hours as needed for wheezing or shortness of breath. (Patient not taking: Reported on 11/30/2023) 6.7 g 0   No facility-administered medications prior to visit.     Review of Systems:   Constitutional: No weight loss or gain, night sweats, fevers, chills, fatigue, or lassitude. HEENT: No headaches, difficulty swallowing, tooth/dental problems, or sore throat. No sneezing, itching, ear ache, nasal congestion, or post nasal drip CV:  No chest pain, orthopnea, PND, swelling in lower extremities, anasarca, dizziness, palpitations, syncope Resp: +shortness of breath with exertion; cough; wheezing; chest tightness. No excess mucus or change in color of mucus. No hemoptysis. No chest wall deformity GI:  No heartburn,  indigestion, abdominal pain, nausea, vomiting, diarrhea, change in bowel habits, loss of appetite, bloody stools.  GU: No dysuria, change in color of urine, urgency or frequency.  No flank pain, no hematuria  Skin: No rash, lesions, ulcerations MSK:  No joint pain or swelling.   Neuro: No dizziness or lightheadedness.  Psych: No depression or anxiety. Mood stable.     Physical Exam:  BP 126/80 (BP Location: Right Arm, Patient Position: Sitting, Cuff Size: Normal)   Pulse 86   Ht 6\' 3"  (1.905 m)   Wt 171 lb 9.6 oz (77.8 kg)   SpO2 94%   BMI 21.45 kg/m   GEN: Pleasant, interactive, well-appearing; in no acute distress HEENT:  Normocephalic and atraumatic.PERRLA. Sclera white. Nasal turbinates pink, moist and patent bilaterally. No rhinorrhea present. Oropharynx pink and moist, without exudate or edema. No lesions, ulcerations, or postnasal drip.  NECK:  Supple w/ fair ROM. No JVD. Thyroid symmetrical with no goiter or nodules  palpated. No lymphadenopathy.   CV: RRR, no m/r/g, no peripheral edema. Pulses intact, +2 bilaterally. No cyanosis, pallor or clubbing. PULMONARY:  Unlabored, regular breathing. Scattered wheezes bilaterally A&P bronchitic cough. No accessory muscle use.  GI: BS present and normoactive. Soft, non-tender to palpation. No organomegaly or masses detected.  MSK: No erythema, warmth or tenderness. Cap refil <2 sec all extrem. No deformities or joint swelling noted.  Neuro: A/Ox3. No focal deficits noted.   Skin: Warm, no lesions or rashe Psych: Normal affect and behavior. Judgement and thought content appropriate.     Lab Results:  CBC    Component Value Date/Time   WBC 6.9 11/12/2023 2201   RBC 4.53 11/12/2023 2201   HGB 13.6 11/12/2023 2201   HGB 15.1 08/14/2023 1508   HCT 41.8 11/12/2023 2201   HCT 45.3 08/14/2023 1508   PLT 171 11/12/2023 2201   PLT 208 08/14/2023 1508   MCV 92.3 11/12/2023 2201   MCV 91 08/14/2023 1508   MCH 30.0 11/12/2023 2201    MCHC 32.5 11/12/2023 2201   RDW 12.7 11/12/2023 2201   RDW 12.8 08/14/2023 1508   LYMPHSABS 2.2 03/02/2023 2006   LYMPHSABS 1.9 11/11/2020 1842   MONOABS 0.5 03/02/2023 2006   EOSABS 0.4 03/02/2023 2006   EOSABS 0.3 11/11/2020 1842   BASOSABS 0.1 03/02/2023 2006   BASOSABS 0.1 11/11/2020 1842    BMET    Component Value Date/Time   NA 139 11/12/2023 2201   NA 140 08/14/2023 1508   K 3.7 11/12/2023 2201   CL 104 11/12/2023 2201   CO2 29 11/12/2023 2201   GLUCOSE 91 11/12/2023 2201   BUN 19 11/12/2023 2201   BUN 17 08/14/2023 1508   CREATININE 0.89 11/12/2023 2201   CREATININE 0.88 06/13/2015 1236   CALCIUM  8.6 (L) 11/12/2023 2201   GFRNONAA >60 11/12/2023 2201   GFRNONAA >89 06/13/2015 1236   GFRAA 98 03/07/2017 1112   GFRAA >89 06/13/2015 1236    BNP No results found for: "BNP"   Imaging:  No results found.  ipratropium-albuterol  (DUONEB) 0.5-2.5 (3) MG/3ML nebulizer solution 3 mL     Date Action Dose Route User   12/19/2023 1614 Given 3 mL Nebulization Obike, Mercy, CMA      methylPREDNISolone  acetate (DEPO-MEDROL ) injection 80 mg     Date Action Dose Route User   12/19/2023 1616 Given 80 mg Intramuscular (Right Ventrogluteal) Lonie Roa, CMA          Latest Ref Rng & Units 05/23/2017   11:48 AM  PFT Results  FVC-Pre L 3.51   FVC-Predicted Pre % 61   FVC-Post L 3.81   FVC-Predicted Post % 67   Pre FEV1/FVC % % 74   Post FEV1/FCV % % 76   FEV1-Pre L 2.60   FEV1-Predicted Pre % 59   FEV1-Post L 2.90   DLCO uncorrected ml/min/mmHg 25.32   DLCO UNC% % 67   DLCO corrected ml/min/mmHg 24.47   DLCO COR %Predicted % 64   DLVA Predicted % 92   TLC L 5.84   TLC % Predicted % 75   RV % Predicted % 90     No results found for: "NITRICOXIDE"      Assessment & Plan:   Asthmatic bronchitis , chronic (HCC) Acute exacerbation of asthmatic bronchitis secondary to discontinuation of maintenance regimen. Unable to complete FeNO. CXR from April clear.  Will treat him with prednisone  taper, depo 80 mg inj x 1 and duoneb x 1 in  office today. Restart Breztri  inhaler, as prescribed by Dr. Waymond Hailey. Provided with samples and new rx sent. Encouraged to notify if symptoms fail to improve or worsen. Close follow up and strict return/ED precautions. Action plan in place.  Patient Instructions  Restart Albuterol  inhaler 2 puffs or 3 mL neb every 6 hours as needed for shortness of breath or wheezing. Notify if symptoms persist despite rescue inhaler/neb use.  Restart Breztri  2 puffs Twice daily. Brush tongue and rinse mouth afterwards  Continue cetirizine  (Zyrtec ) 1 tab daily  Prednisone  taper. 4 tabs for 2 days, then 3 tabs for 2 days, 2 tabs for 2 days, then 1 tab for 2 days, then stop. Take in AM with food. Start tomorrow Steroid shot today   Follow up in 6-8 weeks with Dr. Waymond Hailey or Katie Azzam Mehra,NP. If symptoms do not improve or worsen, please contact office for sooner follow up or seek emergency care.    Advised if symptoms do not improve or worsen, to please contact office for sooner follow up or seek emergency care.   I spent 35 minutes of dedicated to the care of this patient on the date of this encounter to include pre-visit review of records, face-to-face time with the patient discussing conditions above, post visit ordering of testing, clinical documentation with the electronic health record, making appropriate referrals as documented, and communicating necessary findings to members of the patients care team.  Roetta Clarke, NP 12/19/2023  Pt aware and understands NP's role.

## 2023-12-19 NOTE — Assessment & Plan Note (Signed)
 Acute exacerbation of asthmatic bronchitis secondary to discontinuation of maintenance regimen. Unable to complete FeNO. CXR from April clear. Will treat him with prednisone  taper, depo 80 mg inj x 1 and duoneb x 1 in office today. Restart Breztri  inhaler, as prescribed by Dr. Waymond Hailey. Provided with samples and new rx sent. Encouraged to notify if symptoms fail to improve or worsen. Close follow up and strict return/ED precautions. Action plan in place.  Patient Instructions  Restart Albuterol  inhaler 2 puffs or 3 mL neb every 6 hours as needed for shortness of breath or wheezing. Notify if symptoms persist despite rescue inhaler/neb use.  Restart Breztri  2 puffs Twice daily. Brush tongue and rinse mouth afterwards  Continue cetirizine  (Zyrtec ) 1 tab daily  Prednisone  taper. 4 tabs for 2 days, then 3 tabs for 2 days, 2 tabs for 2 days, then 1 tab for 2 days, then stop. Take in AM with food. Start tomorrow Steroid shot today   Follow up in 6-8 weeks with Dr. Waymond Hailey or Katie Betty Daidone,NP. If symptoms do not improve or worsen, please contact office for sooner follow up or seek emergency care.

## 2023-12-20 ENCOUNTER — Other Ambulatory Visit: Payer: Self-pay

## 2023-12-20 NOTE — Patient Instructions (Signed)
 Visit Information  Thank you for taking time to visit with me today. Please don't hesitate to contact me if I can be of assistance to you before our next scheduled appointment.  Our next appointment is by telephone on 01/04/2024 at 10AM Please call the care guide team at [phone] if you need to cancel or reschedule your appointment.   Following is a copy of your care plan:   Goals Addressed             This Visit's Progress    BSW VBCI Social Work Care Plan   On track    Problems:   Air traffic controller Insecurity   CSW Clinical Goal(s):   Over the next 2 weeks the Patient will work with Child psychotherapist to address concerns related to financial strain and food insecurity..  Interventions:  SW will send patient community resources to food pantries and employment resources in his community.  Patient Goals/Self-Care Activities:  Access food resources.  Plan:   Telephone follow up appointment with care management team member scheduled for:  01/04/2024 at 10AM.        Please call the Suicide and Crisis Lifeline: 988 go to [facility] [address] [place] [id number]) call 911 if you are experiencing a Mental Health or Behavioral Health Crisis or need someone to talk to.  Patient verbalizes understanding of instructions and care plan provided today and agrees to view in MyChart. Active MyChart status and patient understanding of how to access instructions and care plan via MyChart confirmed with patient.     Burt Casco, BSW [place]/VBCI - [employer] Social Worker [phone]

## 2023-12-20 NOTE — Patient Outreach (Signed)
 Complex Care Management   Visit Note  12/20/2023  Name:  Jerry Steele MRN: [id number] DOB: Sep 07, 1965  Situation: Referral received for Complex Care Management related to SDOH Barriers:  Food insecurity Financial Resource Strain I obtained verbal consent from Patient.  Visit completed with patient  on the phone  Background:   Past Medical History:  Diagnosis Date   Anginal pain (HCC)    Arthritis of both hands    Asthma    COPD (chronic obstructive pulmonary disease) (HCC)    Dyspnea    Emphysema lung (HCC)    Former smoker    GERD (gastroesophageal reflux disease)    Hyperlipidemia    Pneumonia    Pre-diabetes    Prostate cancer (HCC)     Assessment:  SW spoke with Pt on the phone in [place]. Pt was alert and cognitive. SW addressed SDOH and identified the following needs: Food insecurity and financial strain. Pt stated he currently lives with his uncle (46) and his adult son (30). Pt states he has been living with his uncle for 25 years. Pt confirmed he does not pay rent/mortgage since uncle's home is paid off. Pt is responsible for the general care of his 36 year old uncle. However, Pt is only financially responsible for himself and his adult son. Pt confirmed uncle's immediate family administers his financials and provide money for him as needed. SW offered Pt FNS as a resource for him to aid with food insecurity, but Pt declined the use of governmental resources. However, Pt agreed for SW to send him a food stamps application in case of emergency in the future. 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 Pt states he works 1-2 days of the week and whatever he makes is for himself and son. Adult son is in the process of obtaining employment as well. Pt wishes to receive community resources for food pantries and any employment resources in the community. SW will provide patient with [employer] food pant[employer] schedule for the month of June and July. Pt was asked if he  needed any additional resources and pt declined. SW will send resources via mail and email.    SDOH Interventions    Flowsheet Row Patient Outreach Telephone from 12/20/2023 in [place] POPULATION HEALTH DEPARTMENT Telephone from 12/17/2023 in [place] POPULATION HEALTH DEPARTMENT Office Visit from 11/30/2023 in [facility] at [facility]  SDOH Interventions     Food Insecurity Interventions Community Resources Provided AMB Referral, Community Resources Provided, [id number] Referral  [i have Provided food bank and referral] Intervention Not Indicated  Housing Interventions Intervention Not Indicated Intervention Not Indicated  [patient presently not paying rent but ok] Intervention Not Indicated, AMB Referral  Transportation Interventions Intervention Not Indicated -- Intervention Not Indicated  Utilities Interventions Intervention Not Indicated -- Intervention Not Indicated  Alcohol Usage Interventions -- -- Intervention Not Indicated (Score <7)  Financial Strain Interventions Intervention Not Indicated -- Intervention Not Indicated  Physical Activity Interventions -- -- Intervention Not Indicated  Stress Interventions -- -- Intervention Not Indicated  Social Connections Interventions -- -- Intervention Not Indicated  Health Literacy Interventions -- -- Intervention Not Indicated         Recommendation:   None at this time.   Follow Up Plan:   Telephone follow up appointment date/time:  01/04/2024 at 10AM.   Burt Casco, BSW [place]/[facility] - [facility] Social Worker [phone]

## 2023-12-27 ENCOUNTER — Telehealth: Payer: Self-pay | Admitting: *Deleted

## 2023-12-27 NOTE — Telephone Encounter (Signed)
 Surgical Date: 10/03/2023 Procedure: XI ROBOTIC ASSISTED BILATERAL INGUINAL HERNIA W/ MESH   Received call from patient [phone]~ telephone.   Patient reports several intermittent episodes of left groin pain. States that pain is sharp and stabbing. Reports that pain is accompanied by nausea and dizziness.   Advised if pain does not improve but worsen, or if pain is severe, to go to ER.   Appointment scheduled per patient request.

## 2024-01-04 ENCOUNTER — Other Ambulatory Visit: Payer: Self-pay

## 2024-01-04 NOTE — Patient Instructions (Signed)

## 2024-01-04 NOTE — Patient Outreach (Signed)
 Complex Care Management   Visit Note  01/04/2024  Name:  Jerry Steele MRN: [id number] DOB: Dec 18, 1965  Situation: Referral received for Complex Care Management related to SDOH Barriers:  Food insecurity Financial Resource Strain I obtained verbal consent from Patient.  Visit completed with patient  on the phone  Background:   Past Medical History:  Diagnosis Date   Anginal pain (HCC)    Arthritis of both hands    Asthma    COPD (chronic obstructive pulmonary disease) (HCC)    Dyspnea    Emphysema lung (HCC)    Former smoker    GERD (gastroesophageal reflux disease)    Hyperlipidemia    Pneumonia    Pre-diabetes    Prostate cancer (HCC)     Assessment: SW conducted follow-up call over the phone with Pt. Pt was alert and cognitive. Pt stated he did receive the resources SW emailed and mailed to him. Pt states he plans to access a food resource today at 12pm or attend a food distribution site tomorrow morning at 10AM. SW asked patient if he needed additional resources and patient declined. Pt states he will reach back out to his PCP if he needs additional assistance. SW will be sending patient [facility] financial assistance application for him to complete and turn in to the hospital for possible assistance with hospital expenses.   SDOH Interventions    Flowsheet Row Patient Outreach from 01/04/2024 in [place] POPULATION HEALTH DEPARTMENT Patient Outreach Telephone from 12/20/2023 in [place] POPULATION HEALTH DEPARTMENT Telephone from 12/17/2023 in [place] POPULATION HEALTH DEPARTMENT Office Visit from 11/30/2023 in [facility] Primary Care at [facility]  SDOH Interventions      Food Insecurity Interventions Community Resources Provided [employer] Provided AMB Referral, Community Resources Provided, [id number] Referral  [i have Provided food bank and referral] Intervention Not Indicated  Housing Interventions Intervention Not Indicated Intervention Not Indicated  Intervention Not Indicated  [patient presently not paying rent but ok] Intervention Not Indicated, AMB Referral  Transportation Interventions -- Intervention Not Indicated -- Intervention Not Indicated  Utilities Interventions Intervention Not Indicated Intervention Not Indicated -- Intervention Not Indicated  Alcohol Usage Interventions -- -- -- Intervention Not Indicated (Score <7)  Financial Strain Interventions Community Resources Provided Intervention Not Indicated -- Intervention Not Indicated  Physical Activity Interventions -- -- -- Intervention Not Indicated  Stress Interventions -- -- -- Intervention Not Indicated  Social Connections Interventions -- -- -- Intervention Not Indicated  Health Literacy Interventions -- -- -- Intervention Not Indicated      Recommendation:   Pt will complete financial assistance application for [facility] and turn in to hospital.   Follow Up Plan:   Patient has met all care management goals. Care Management case will be closed. Patient has been provided contact information should new needs arise.   Burt Casco, BSW [place]/VBCI - [employer] Social Worker [phone]

## 2024-01-09 MED ORDER — IPRATROPIUM-ALBUTEROL 0.5-2.5 (3) MG/3ML IN SOLN: 3.0000 mL | Freq: Two times a day (BID) | RESPIRATORY_TRACT | 2 refills | Status: AC | PRN

## 2024-01-09 NOTE — Telephone Encounter (Signed)
 Call patient with update. Complete. Please request future refills from established Pulmonology.

## 2024-01-10 ENCOUNTER — Ambulatory Visit: Payer: Self-pay | Admitting: General Surgery

## 2024-01-15 ENCOUNTER — Ambulatory Visit: Payer: Self-pay | Admitting: General Surgery

## 2024-01-16 ENCOUNTER — Ambulatory Visit: Payer: Self-pay | Admitting: Neurology

## 2024-01-23 ENCOUNTER — Ambulatory Visit (INDEPENDENT_AMBULATORY_CARE_PROVIDER_SITE_OTHER): Payer: Self-pay | Admitting: Family

## 2024-01-23 ENCOUNTER — Ambulatory Visit: Payer: Self-pay | Admitting: Family

## 2024-01-23 ENCOUNTER — Encounter: Payer: Self-pay | Admitting: Family

## 2024-01-23 VITALS — BP 136/80 | HR 73 | Temp 97.5°F | Resp 18 | Ht 72.0 in | Wt 184.0 lb

## 2024-01-23 DIAGNOSIS — F32A Depression, unspecified: Secondary | ICD-10-CM

## 2024-01-23 DIAGNOSIS — R9431 Abnormal electrocardiogram [ECG] [EKG]: Secondary | ICD-10-CM

## 2024-01-23 DIAGNOSIS — R42 Dizziness and giddiness: Secondary | ICD-10-CM

## 2024-01-23 DIAGNOSIS — F419 Anxiety disorder, unspecified: Secondary | ICD-10-CM

## 2024-01-23 DIAGNOSIS — E785 Hyperlipidemia, unspecified: Secondary | ICD-10-CM

## 2024-01-23 MED ORDER — HYDROXYZINE PAMOATE 25 MG PO CAPS: 25.0000 mg | ORAL_CAPSULE | Freq: Three times a day (TID) | ORAL | 1 refills | Status: DC | PRN

## 2024-01-23 MED ORDER — FLUOXETINE HCL 10 MG PO TABS: 10.0000 mg | ORAL_TABLET | Freq: Every day | ORAL | 0 refills | Status: AC

## 2024-01-23 NOTE — Progress Notes (Signed)
 Patient feels weird,  he eels like when  he walking he is moving slow

## 2024-01-23 NOTE — Progress Notes (Signed)
 Patient ID: Jerry Steele, male    DOB: March 24, 1966  MRN: [id number]  CC: Follow-Up  Subjective: Jerry Steele is a 58 y.o. male who presents for follow-up.   His concerns today include:  - States he feels weird. He is unable to explain. Denies red flag symptoms.  - Anxiety depression related to work-life balance. He would like to try medication and referral to specialist to see if this helps. He denies thoughts of self-harm, suicidal ideations, homicidal ideations.   Patient Active Problem List   Diagnosis Date Noted   Bilateral inguinal hernia without obstruction or gangrene 10/03/2023   Asthmatic bronchitis , chronic (HCC) 08/06/2023   Nasal septal deviation 01/22/2023   Personal history of nicotine  dependence 01/18/2023   Left inguinal hernia 10/10/2022   Prediabetes 04/14/2022   Dyspnea on exertion 04/12/2022   COPD (chronic obstructive pulmonary disease) with emphysema (HCC) 07/19/2021   Hyperlipidemia 05/05/2021   Rectal bleeding 11/15/2020   Acute bronchitis 08/30/2018   Spell of dizziness 03/14/2018   Viral upper respiratory illness 03/14/2018   Cough variant asthma 11/27/2017   Cough 06/27/2017   Rib pain 06/27/2017     Current Outpatient Medications on File Prior to Visit  Medication Sig Dispense Refill   albuterol  (PROVENTIL ) (2.5 MG/3ML) 0.083% nebulizer solution Take 3 mLs (2.5 mg total) by nebulization every 6 (six) hours as needed for wheezing or shortness of breath. 75 mL 5   albuterol  (VENTOLIN  HFA) 108 (90 Base) MCG/ACT inhaler Inhale 1-2 puffs into the lungs every 6 (six) hours as needed for wheezing or shortness of breath. 8 g 2   atorvastatin  (LIPITOR) 40 MG tablet Take 1 tablet (40 mg total) by mouth daily. 30 tablet 2   budesonide -glycopyrrolate-formoterol  (BREZTRI  AEROSPHERE) 160-9-4.8 MCG/ACT AERO inhaler Inhale 2 puffs into the lungs in the morning and at bedtime. 10.7 g 11   ipratropium-albuterol  (DUONEB) 0.5-2.5 (3) MG/3ML SOLN Take 3 mLs by  nebulization every 12 (twelve) hours as needed. 360 mL 2   SUMAtriptan  (IMITREX ) 25 MG tablet Take 25 mg (1 tablet total) by mouth at the start of the headache. May repeat in 2 hours x 1 if headache persists. Max of 2 tablets/24 hours. 30 tablet 2   acetaminophen  (TYLENOL ) 500 MG tablet Take 1,000 mg by mouth every 6 (six) hours as needed for mild pain (pain score 1-3) or moderate pain (pain score 4-6). (Patient not taking: Reported on 11/30/2023)     cetirizine  (ZYRTEC ) 10 MG tablet Take 1 tablet (10 mg total) by mouth daily. (Patient not taking: Reported on 11/30/2023) 30 tablet 1   ibuprofen  (ADVIL ) 600 MG tablet Take 1 tablet (600 mg total) by mouth every 8 (eight) hours as needed. (Patient taking differently: Take 600 mg by mouth every 8 (eight) hours as needed for mild pain (pain score 1-3) or moderate pain (pain score 4-6).) 21 tablet 0   oxyCODONE  (ROXICODONE ) 5 MG immediate release tablet Take 1 tablet (5 mg total) by mouth every 4 (four) hours as needed. (Patient taking differently: Take 5 mg by mouth every 4 (four) hours as needed for moderate pain (pain score 4-6) or severe pain (pain score 7-10).) 20 tablet 0   predniSONE  (DELTASONE ) 10 MG tablet 4 tabs for 2 days, then 3 tabs for 2 days, 2 tabs for 2 days, then 1 tab for 2 days, then stop (Patient not taking: Reported on 01/23/2024) 20 tablet 0   sildenafil  (REVATIO ) 20 MG tablet TAKE 2 TO 5 TABLETS BY MOUTH  1 TO 2 HOURS PRIOR TO SEXUAL ACTIVITY ON AN EMPTY STOMACH (Patient taking differently: Take 40-100 mg by mouth See admin instructions. TAKE 2 TO 5 TABLETS BY MOUTH 1 TO 2 HOURS PRIOR TO SEXUAL ACTIVITY ON AN EMPTY STOMACH) 90 tablet 3   triamcinolone  (KENALOG ) 0.025 % ointment Apply 1 Application topically 2 (two) times daily. (Patient not taking: Reported on 11/30/2023) 60 g 0   No current facility-administered medications on file prior to visit.    No Known Allergies  Social History   Socioeconomic History   Marital status: Divorced     Spouse name: Not on file   Number of children: Not on file   Years of education: Not on file   Highest education level: Not on file  Occupational History   Not on file  Tobacco Use   Smoking status: Former    Current packs/day: 0.00    Average packs/day: 0.3 packs/day for 30.0 years (7.5 ttl pk-yrs)    Types: Cigarettes    Start date: 12/17/1986    Quit date: 12/16/2016    Years since quitting: 7.1    Passive exposure: Never   Smokeless tobacco: Never  Vaping Use   Vaping status: Never Used  Substance and Sexual Activity   Alcohol use: No    Alcohol/week: 0.0 standard drinks of alcohol   Drug use: No   Sexual activity: Yes  Other Topics Concern   Not on file  Social History Narrative   Lives with son   Social Drivers of Health   Financial Resource Strain: Medium Risk (01/04/2024)   Overall Financial Resource Strain (CARDIA)    Difficulty of Paying Living Expenses: Somewhat hard  Food Insecurity: No Food Insecurity (01/04/2024)   Hunger Vital Sign    Worried About Running Out of Food in the Last Year: Never true    Ran Out of Food in the Last Year: Never true  Recent Concern: Food Insecurity - Food Insecurity Present (12/20/2023)   Hunger Vital Sign    Worried About Running Out of Food in the Last Year: Often true    Ran Out of Food in the Last Year: Sometimes true  Transportation Needs: No Transportation Needs (12/20/2023)   PRAPARE - Administrator, Civil Service (Medical): No    Lack of Transportation (Non-Medical): No  Physical Activity: Sufficiently Active (11/30/2023)   Exercise Vital Sign    Days of Exercise per Week: 5 days    Minutes of Exercise per Session: 30 min  Stress: No Stress Concern Present (11/30/2023)   [employer] of Occupational Health - Occupational Stress Questionnaire    Feeling of Stress : Not at all  Social Connections: Socially Isolated (11/30/2023)   Social Connection and Isolation Panel    Frequency of Communication with  Friends and Family: Once a week    Frequency of Social Gatherings with Friends and Family: Once a week    Attends Religious Services: More than 4 times per year    Active Member of [employer] or Organizations: No    Attends Banker Meetings: Never    Marital Status: Divorced  Catering manager Violence: Not At Risk (01/04/2024)   Humiliation, Afraid, Rape, and Kick questionnaire    Fear of Current or Ex-Partner: No    Emotionally Abused: No    Physically Abused: No    Sexually Abused: No    Family History  Problem Relation Age of Onset   Sudden Cardiac Death Brother 3   Colon  cancer Neg Hx    Colon polyps Neg Hx    Esophageal cancer Neg Hx    Rectal cancer Neg Hx    Stomach cancer Neg Hx     Past Surgical History:  Procedure Laterality Date   HEMORRHOID SURGERY  1985   x2    ROS: Review of Systems Negative except as stated above  PHYSICAL EXAM: BP 136/80   Pulse 73   Temp (!) 97.5 F (36.4 C) (Oral)   Resp 18   Ht 6' (1.829 m)   Wt 184 lb (83.5 kg)   SpO2 95%   BMI 24.95 kg/m   Physical Exam HENT:     Head: Normocephalic and atraumatic.     Nose: Nose normal.     Mouth/Throat:     Mouth: Mucous membranes are moist.     Pharynx: Oropharynx is clear.  Eyes:     Extraocular Movements: Extraocular movements intact.     Conjunctiva/sclera: Conjunctivae normal.     Pupils: Pupils are equal, round, and reactive to light.  Cardiovascular:     Rate and Rhythm: Normal rate and regular rhythm.     Pulses: Normal pulses.     Heart sounds: Normal heart sounds.  Pulmonary:     Effort: Pulmonary effort is normal.     Breath sounds: Normal breath sounds.  Musculoskeletal:        General: Normal range of motion.     Cervical back: Normal range of motion and neck supple.  Neurological:     General: No focal deficit present.     Mental Status: He is alert and oriented to person, place, and time.  Psychiatric:        Mood and Affect: Mood normal.         Behavior: Behavior normal.      ASSESSMENT AND PLAN: 1. Giddiness (Primary) - Routine screening.  - Follow-up with primary provider as scheduled. - CBC - Lipid panel - CMP14+EGFR - Hemoglobin A1c - TSH - EKG 12-Lead  2. Anxiety and depression - Patient denies thoughts of self-harm, suicidal ideations, homicidal ideations. - Fluoxetine  and Hydroxyzine  as prescribed. Counseled on medication adherence/adverse effects. - Referral to Psychiatry for evaluation/management.  - Follow-up with primary provider in 4 weeks or sooner if needed. - hydrOXYzine  (VISTARIL ) 25 MG capsule; Take 1 capsule (25 mg total) by mouth every 8 (eight) hours as needed.  Dispense: 90 capsule; Refill: 1 - FLUoxetine  (PROZAC ) 10 MG tablet; Take 1 tablet (10 mg total) by mouth daily.  Dispense: 90 tablet; Refill: 0 - Ambulatory referral to Psychiatry   Patient was given the opportunity to ask questions.  Patient verbalized understanding of the plan and was able to repeat key elements of the plan. Patient was given clear instructions to go to Emergency Department or return to medical center if symptoms don't improve, worsen, or new problems develop.The patient verbalized understanding.   Orders Placed This Encounter  Procedures   CBC   Lipid panel   CMP14+EGFR   Hemoglobin A1c   TSH   Ambulatory referral to Psychiatry   EKG 12-Lead     Requested Prescriptions   Signed Prescriptions Disp Refills   hydrOXYzine  (VISTARIL ) 25 MG capsule 90 capsule 1    Sig: Take 1 capsule (25 mg total) by mouth every 8 (eight) hours as needed.   FLUoxetine  (PROZAC ) 10 MG tablet 90 tablet 0    Sig: Take 1 tablet (10 mg total) by mouth daily.    Follow-up with  primary provider as scheduled.  Greig JINNY Drones, NP

## 2024-01-24 LAB — CMP14+EGFR
ALT: 11 IU/L (ref 0–44)
AST: 13 IU/L (ref 0–40)
Albumin: 4.3 g/dL (ref 3.8–4.9)
Alkaline Phosphatase: 83 IU/L (ref 44–121)
BUN/Creatinine Ratio: 18 (ref 9–20)
BUN: 18 mg/dL (ref 6–24)
Bilirubin Total: 0.4 mg/dL (ref 0.0–1.2)
CO2: 23 mmol/L (ref 20–29)
Calcium: 9.3 mg/dL (ref 8.7–10.2)
Chloride: 101 mmol/L (ref 96–106)
Creatinine, Ser: 1.01 mg/dL (ref 0.76–1.27)
Globulin, Total: 3.4 g/dL (ref 1.5–4.5)
Glucose: 91 mg/dL (ref 70–99)
Potassium: 4.2 mmol/L (ref 3.5–5.2)
Sodium: 141 mmol/L (ref 134–144)
Total Protein: 7.7 g/dL (ref 6.0–8.5)
eGFR: 86 mL/min/1.73 (ref >59)

## 2024-01-24 LAB — LIPID PANEL
Chol/HDL Ratio: 5.3 ratio — ABNORMAL HIGH (ref 0.0–5.0)
Cholesterol, Total: 212 mg/dL — ABNORMAL HIGH (ref 100–199)
HDL: 40 mg/dL (ref >39)
LDL Chol Calc (NIH): 134 mg/dL — ABNORMAL HIGH (ref 0–99)
Triglycerides: 214 mg/dL — ABNORMAL HIGH (ref 0–149)
VLDL Cholesterol Cal: 38 mg/dL (ref 5–40)

## 2024-01-24 LAB — TSH: TSH: 2.41 u[IU]/mL (ref 0.450–4.500)

## 2024-01-24 LAB — CBC
Hematocrit: 46.6 % (ref 37.5–51.0)
Hemoglobin: 15.4 g/dL (ref 13.0–17.7)
MCH: 30.1 pg (ref 26.6–33.0)
MCHC: 33 g/dL (ref 31.5–35.7)
MCV: 91 fL (ref 79–97)
Platelets: 202 x10E3/uL (ref 150–450)
RBC: 5.11 x10E6/uL (ref 4.14–5.80)
RDW: 12.8 % (ref 11.6–15.4)
WBC: 6.8 x10E3/uL (ref 3.4–10.8)

## 2024-01-24 LAB — HEMOGLOBIN A1C
Est. average glucose Bld gHb Est-mCnc: 105 mg/dL
Hgb A1c MFr Bld: 5.3 % (ref 4.8–5.6)

## 2024-01-24 MED ORDER — ATORVASTATIN CALCIUM 80 MG PO TABS: 80.0000 mg | ORAL_TABLET | Freq: Every day | ORAL | 0 refills | Status: DC

## 2024-03-26 ENCOUNTER — Encounter: Payer: Self-pay | Admitting: Physician Assistant

## 2024-03-26 ENCOUNTER — Ambulatory Visit: Payer: Self-pay | Admitting: Physician Assistant

## 2024-03-26 VITALS — BP 111/75 | HR 78 | Ht 75.0 in | Wt 186.0 lb

## 2024-03-26 DIAGNOSIS — G4700 Insomnia, unspecified: Secondary | ICD-10-CM

## 2024-03-26 DIAGNOSIS — R053 Chronic cough: Secondary | ICD-10-CM

## 2024-03-26 DIAGNOSIS — F418 Other specified anxiety disorders: Secondary | ICD-10-CM

## 2024-03-26 DIAGNOSIS — G43909 Migraine, unspecified, not intractable, without status migrainosus: Secondary | ICD-10-CM

## 2024-03-26 DIAGNOSIS — J45991 Cough variant asthma: Secondary | ICD-10-CM

## 2024-03-26 DIAGNOSIS — J439 Emphysema, unspecified: Secondary | ICD-10-CM

## 2024-03-26 DIAGNOSIS — J45909 Unspecified asthma, uncomplicated: Secondary | ICD-10-CM

## 2024-03-26 DIAGNOSIS — R0789 Other chest pain: Secondary | ICD-10-CM

## 2024-03-26 NOTE — Progress Notes (Unsigned)
 Established Patient Office Visit  Subjective   Patient ID: Jerry Steele, male    DOB: 10/03/1965  Age: 58 y.o. MRN: [id number]  Chief Complaint  Patient presents with   Chest Pain    Patient explains it as pressure in chest at night, He has also been having left shoulder pain   Migraine    Migraine for the past few days, located on right back side of head. Patient has taken tylenol  with little relief.    Discussed the use of AI scribe software for clinical note transcription with the patient, who gave verbal consent to proceed.  History of Present Illness   Jerry Steele is a 58 year old male with COPD who presents with chest pain and sleep disturbances.  He experiences chest pain described as pressure over the left breast area, sometimes with difficulty breathing. The pain is 'electric' and similar to the sensation of food going down the wrong way. An EKG was performed in July 2025.  States that this has been ongoing for several months and was happening when he had the EKG.  He was given a referral to be sen by cardiology, but he did not hear from them yet.   He has COPD and uses a steroid inhaler twice daily, a rescue inhaler about once a week, and a nebulizer as needed. He frequently coughs, sometimes with a taste of blood, though no blood is seen.  He wakes up unable to breathe and sleeps only three to four hours per night. He has been prescribed hydroxyzine  for sleep but has not been taking it regularly. He does not use a CPAP machine and has not had a sleep study.  He has been prescribed fluoxetine  for anxiety and depression but has not been taking it due to fear of medication. He is interested in medication to improve sleep.  He experiences migraines and has been prescribed sumatriptan , which he is willing to take but has not tried yet. He believes improving sleep may help alleviate migraines.  He has a persistent cough and has been advised to try Zyrtec  for potential  allergies but has not been taking any allergy  medication regularly.     Past Medical History:  Diagnosis Date   Anginal pain (HCC)    Arthritis of both hands    Asthma    COPD (chronic obstructive pulmonary disease) (HCC)    Dyspnea    Emphysema lung (HCC)    Former smoker    GERD (gastroesophageal reflux disease)    Hyperlipidemia    Pneumonia    Pre-diabetes    Prostate cancer (HCC)    Social History   Socioeconomic History   Marital status: Divorced    Spouse name: Not on file   Number of children: Not on file   Years of education: Not on file   Highest education level: Not on file  Occupational History   Not on file  Tobacco Use   Smoking status: Former    Current packs/day: 0.00    Average packs/day: 0.3 packs/day for 30.0 years (7.5 ttl pk-yrs)    Types: Cigarettes    Start date: 12/17/1986    Quit date: 12/16/2016    Years since quitting: 7.2    Passive exposure: Never   Smokeless tobacco: Never  Vaping Use   Vaping status: Never Used  Substance and Sexual Activity   Alcohol use: No    Alcohol/week: 0.0 standard drinks of alcohol   Drug use: No   Sexual activity:  Yes  Other Topics Concern   Not on file  Social History Narrative   Lives with son   Social Drivers of Health   Financial Resource Strain: Medium Risk (01/04/2024)   Overall Financial Resource Strain (CARDIA)    Difficulty of Paying Living Expenses: Somewhat hard  Food Insecurity: No Food Insecurity (01/04/2024)   Hunger Vital Sign    Worried About Running Out of Food in the Last Year: Never true    Ran Out of Food in the Last Year: Never true  Recent Concern: Food Insecurity - Food Insecurity Present (12/20/2023)   Hunger Vital Sign    Worried About Running Out of Food in the Last Year: Often true    Ran Out of Food in the Last Year: Sometimes true  Transportation Needs: No Transportation Needs (12/20/2023)   PRAPARE - Administrator, Civil Service (Medical): No    Lack of  Transportation (Non-Medical): No  Physical Activity: Sufficiently Active (11/30/2023)   Exercise Vital Sign    Days of Exercise per Week: 5 days    Minutes of Exercise per Session: 30 min  Stress: No Stress Concern Present (11/30/2023)   [employer] of Occupational Health - Occupational Stress Questionnaire    Feeling of Stress : Not at all  Social Connections: Socially Isolated (11/30/2023)   Social Connection and Isolation Panel    Frequency of Communication with Friends and Family: Once a week    Frequency of Social Gatherings with Friends and Family: Once a week    Attends Religious Services: More than 4 times per year    Active Member of [employer] or Organizations: No    Attends Banker Meetings: Never    Marital Status: Divorced  Catering manager Violence: Not At Risk (01/04/2024)   Humiliation, Afraid, Rape, and Kick questionnaire    Fear of Current or Ex-Partner: No    Emotionally Abused: No    Physically Abused: No    Sexually Abused: No   Family History  Problem Relation Age of Onset   Sudden Cardiac Death Brother 51   Colon cancer Neg Hx    Colon polyps Neg Hx    Esophageal cancer Neg Hx    Rectal cancer Neg Hx    Stomach cancer Neg Hx    No Known Allergies  Review of Systems  Constitutional:  Negative for chills and fever.  HENT: Negative.    Eyes: Negative.   Respiratory:  Positive for cough and sputum production. Negative for shortness of breath and wheezing.   Cardiovascular:  Positive for chest pain. Negative for palpitations.  Gastrointestinal:  Negative for nausea and vomiting.  Genitourinary: Negative.   Musculoskeletal: Negative.   Skin: Negative.   Neurological:  Positive for headaches.  Endo/Heme/Allergies: Negative.   Psychiatric/Behavioral: Negative.        Objective:     BP 111/75 (BP Location: Left Arm, Patient Position: Sitting, Cuff Size: Large)   Pulse 78   Ht 6' 3 (1.905 m)   Wt 186 lb (84.4 kg)   SpO2 95%   BMI 23.25  kg/m  BP Readings from Last 3 Encounters:  03/26/24 111/75  01/23/24 136/80  12/19/23 126/80   Wt Readings from Last 3 Encounters:  03/26/24 186 lb (84.4 kg)  01/23/24 184 lb (83.5 kg)  12/19/23 171 lb 9.6 oz (77.8 kg)    Physical Exam Vitals and nursing note reviewed.    GENERAL: Alert, cooperative, well developed, no acute distress. HEENT: Normocephalic, normal oropharynx,  moist mucous membranes. CHEST: Clear to auscultation bilaterally, no wheezes, rhonchi, or crackles. CARDIOVASCULAR: Normal heart rate and rhythm, S1 and S2 normal without murmurs. EXTREMITIES: No cyanosis or edema. NEUROLOGICAL: Cranial nerves grossly intact, moves all extremities without gross motor or sensory deficit.   Assessment & Plan:   Problem List Items Addressed This Visit       Respiratory   Cough variant asthma   COPD (chronic obstructive pulmonary disease) with emphysema (HCC)   Other Visit Diagnoses       Atypical chest pain    -  Primary      Assessment and Plan Chest pain and dyspnea evaluation Intermittent chest pain with previous EKG showing minor abnormalities. Cardiology referral pending. - Provide cardiology contact information for appointment scheduling. - Advise to call cardiology in the morning to schedule an appointment. Red flags given for prompt reevaluation   Asthma Asthma managed with daily steroid inhaler and rescue inhaler as needed.  Chronic cough Persistent cough possibly related to allergies or stress. No current allergy  medication use. - Recommend starting Zyrtec  daily for allergies. - Consider Prilosec  if Zyrtec  does not improve symptoms.  Insomnia Difficulty sleeping, possibly related to breathing issues. Discussed importance of sleep for overall health and its impact on migraines. Hydroxyzine  recommended for sleep. - Recommend trying hydroxyzine  at bedtime for sleep. - Advise elevating head with extra pillows to improve breathing during sleep. -  Discuss potential for sleep study after insurance starts in November.  Migraine Frequent migraines possibly exacerbated by lack of sleep. Previously prescribed sumatriptan . - Recommend using sumatriptan  for migraine relief. - Emphasize importance of improving sleep to reduce migraine frequency.  Depression and anxiety symptoms Symptoms related to work-life balance. Prefers self-management over fluoxetine .  Follow-Up Scheduled follow-up with primary care provider Greig Gravely at the end of October.   I have reviewed the patient's medical history (PMH, PSH, Social History, Family History, Medications, and allergies) , and have been updated if relevant. I spent 30 minutes reviewing chart and  face to face time with patient.    Return if symptoms worsen or fail to improve.    Kirk RAMAN Mayers, PA-C

## 2024-03-26 NOTE — Patient Instructions (Addendum)
 [facility]  [address] [place], [place] [address] [phone]  Please call cardiology in the morning  VISIT SUMMARY:  Today, you were seen for chest pain and sleep disturbances. We discussed your ongoing issues with COPD, chest pain, sleep problems, migraines, and anxiety. We reviewed your current medications and made some recommendations to help manage your symptoms better.  YOUR PLAN:  -CHEST PAIN AND DYSPNEA: Your chest pain and difficulty breathing are being evaluated further. You had an EKG in July 2025 that showed minor abnormalities. We are referring you to a cardiologist for further evaluation. Please call the cardiology office in the morning to schedule an appointment.  -ASTHMA: Your asthma is currently managed with a daily steroid inhaler and a rescue inhaler as needed. Continue using these medications as prescribed.  -CHRONIC COUGH: Your persistent cough may be related to allergies or stress. We recommend starting Zyrtec  daily to see if it helps. If Zyrtec  does not improve your symptoms, we may consider trying Prilosec .  -INSOMNIA: Your difficulty sleeping may be related to breathing issues. We recommend trying hydroxyzine  at bedtime to help you sleep. Elevating your head with extra pillows may also improve your breathing during sleep.   -MIGRAINE: Your frequent migraines may be worsened by lack of sleep. We recommend using sumatriptan  for migraine relief and emphasize the importance of improving your sleep to reduce the frequency of migraines.  -DEPRESSION AND ANXIETY SYMPTOMS: Your symptoms of depression and anxiety seem to be related to work-life balance. You prefer to manage these symptoms without medication at this time.  Nonspecific Chest Pain, Adult Chest pain is an uncomfortable, tight, or painful feeling in the chest. The pain can feel like a crushing, aching, or squeezing pressure. A person can feel a burning or tingling sensation. Chest pain can  also be felt in your back, neck, jaw, shoulder, or arm. This pain can be worse when you move, sneeze, or take a deep breath. Chest pain can be caused by a condition that is life-threatening. This must be treated right away. It can also be caused by something that is not life-threatening. If you have chest pain, it can be hard to know the difference, so it is important to get help right away to make sure that you do not have a serious condition. Some life-threatening causes of chest pain include: Heart attack. A tear in the body's main blood vessel (aortic dissection). Inflammation around your heart (pericarditis). A problem in the lungs, such as a blood clot (pulmonary embolism) or a collapsed lung (pneumothorax). Some non life-threatening causes of chest pain include: Heartburn. Anxiety or stress. Damage to the bones, muscles, and cartilage that make up your chest wall. Pneumonia or bronchitis. Shingles infection (varicella-zoster virus). Your chest pain may come and go. It may also be constant. Your health care provider will do tests and other studies to find the cause of your pain. Treatment will depend on the cause of your chest pain. Follow these instructions at home: Medicines Take over-the-counter and prescription medicines only as told by your health care provider. If you were prescribed an antibiotic medicine, take it as told by your health care provider. Do not stop taking the antibiotic even if you start to feel better. Activity Avoid any activities that cause chest pain. Do not lift anything that is heavier than 10 lb (4.5 kg), or the limit that you are told, until your health care provider says that it is safe. Rest as directed by  your health care provider. Return to your normal activities only as told by your health care provider. Ask your health care provider what activities are safe for you. Lifestyle     Do not use any products that contain nicotine  or tobacco, such as  cigarettes, e-cigarettes, and chewing tobacco. If you need help quitting, ask your health care provider. Do not drink alcohol. Make healthy lifestyle changes as recommended. These may include: Getting regular exercise. Ask your health care provider to suggest some exercises that are safe for you. Eating a heart-healthy diet. This includes plenty of fresh fruits and vegetables, whole grains, low-fat (lean) protein, and low-fat dairy products. A dietitian can help you find healthy eating options. Maintaining a healthy weight. Managing any other health conditions you may have, such as high blood pressure (hypertension) or diabetes. Reducing stress, such as with yoga or relaxation techniques. General instructions Pay attention to any changes in your symptoms. It is up to you to get the results of any tests that were done. Ask your health care provider, or the department that is doing the tests, when your results will be ready. Keep all follow-up visits as told by your health care provider. This is important. You may be asked to go for further testing if your chest pain does not go away. Contact a health care provider if: Your chest pain does not go away. You feel depressed. You have a fever. You notice changes in your symptoms or develop new symptoms. Get help right away if: Your chest pain gets worse. You have a cough that gets worse, or you cough up blood. You have severe pain in your abdomen. You faint. You have sudden, unexplained chest discomfort. You have sudden, unexplained discomfort in your arms, back, neck, or jaw. You have shortness of breath at any time. You suddenly start to sweat, or your skin gets clammy. You feel nausea or you vomit. You suddenly feel lightheaded or dizzy. You have severe weakness, or unexplained weakness or fatigue. Your heart begins to beat quickly, or it feels like it is skipping beats. These symptoms may represent a serious problem that is an emergency.  Do not wait to see if the symptoms will go away. Get medical help right away. Call your local emergency services (911 in the [place] Do not drive yourself to the hospital. Summary Chest pain can be caused by a condition that is serious and requires urgent treatment. It may also be caused by something that is not life-threatening. Your health care provider may do lab tests and other studies to find the cause of your pain. Follow your health care provider's instructions on taking medicines, making lifestyle changes, and getting emergency treatment if symptoms become worse. Keep all follow-up visits as told by your health care provider. This includes visits for any further testing if your chest pain does not go away. This information is not intended to replace advice given to you by your health care provider. Make sure you discuss any questions you have with your health care provider. Document Revised: 05/18/2022 Document Reviewed: 05/18/2022 Elsevier Patient Education  2024 [employer].

## 2024-03-27 ENCOUNTER — Encounter: Payer: Self-pay | Admitting: Physician Assistant

## 2024-05-01 ENCOUNTER — Other Ambulatory Visit: Payer: Self-pay | Admitting: Urology

## 2024-05-01 DIAGNOSIS — N529 Male erectile dysfunction, unspecified: Secondary | ICD-10-CM

## 2024-05-12 ENCOUNTER — Ambulatory Visit (INDEPENDENT_AMBULATORY_CARE_PROVIDER_SITE_OTHER): Payer: Self-pay | Admitting: Family

## 2024-05-12 ENCOUNTER — Encounter: Payer: Self-pay | Admitting: Family

## 2024-05-12 ENCOUNTER — Ambulatory Visit (INDEPENDENT_AMBULATORY_CARE_PROVIDER_SITE_OTHER): Payer: Self-pay

## 2024-05-12 VITALS — BP 130/77 | HR 66 | Temp 97.7°F | Resp 16 | Ht 75.0 in | Wt 191.8 lb

## 2024-05-12 DIAGNOSIS — J4489 Other specified chronic obstructive pulmonary disease: Secondary | ICD-10-CM

## 2024-05-12 DIAGNOSIS — F418 Other specified anxiety disorders: Secondary | ICD-10-CM

## 2024-05-12 DIAGNOSIS — E785 Hyperlipidemia, unspecified: Secondary | ICD-10-CM

## 2024-05-12 DIAGNOSIS — F419 Anxiety disorder, unspecified: Secondary | ICD-10-CM

## 2024-05-12 DIAGNOSIS — J449 Chronic obstructive pulmonary disease, unspecified: Secondary | ICD-10-CM

## 2024-05-12 DIAGNOSIS — Z87891 Personal history of nicotine dependence: Secondary | ICD-10-CM

## 2024-05-12 MED ORDER — ATORVASTATIN CALCIUM 80 MG PO TABS: 80.0000 mg | ORAL_TABLET | Freq: Every day | ORAL | 0 refills | Status: DC

## 2024-05-12 MED ORDER — BREZTRI AEROSPHERE 160-9-4.8 MCG/ACT IN AERO: 2.0000 | INHALATION_SPRAY | Freq: Two times a day (BID) | RESPIRATORY_TRACT | 11 refills | Status: AC

## 2024-05-12 MED ORDER — HYDROXYZINE PAMOATE 25 MG PO CAPS: 25.0000 mg | ORAL_CAPSULE | Freq: Three times a day (TID) | ORAL | 1 refills | Status: AC | PRN

## 2024-05-12 MED ORDER — ALBUTEROL SULFATE HFA 108 (90 BASE) MCG/ACT IN AERS: 1.0000 | INHALATION_SPRAY | Freq: Four times a day (QID) | RESPIRATORY_TRACT | 2 refills | Status: AC | PRN

## 2024-05-12 MED ORDER — ALBUTEROL SULFATE (2.5 MG/3ML) 0.083% IN NEBU: 2.5000 mg | INHALATION_SOLUTION | Freq: Four times a day (QID) | RESPIRATORY_TRACT | 5 refills | Status: AC | PRN

## 2024-05-12 NOTE — Progress Notes (Signed)
 Patient ID: Rainer Mounce, male    DOB: [birth year]  MRN: [id number]  CC: Chronic Conditions Follow-Up  Subjective: Jerry Steele is a 58 y.o. male who presents for chronic conditions follow-up.   His concerns today include:  - COPD. Cough persisting and causing chest discomfort. Denies red flag symptoms. Doing well on Breztri  inhaler, Albuterol  inhaler, and Albuterol  nebulizer. States he never followed-up with Pulmonology.  - Reports upcoming appointment with Cardiology.  - Doing well on Atorvastatin , no issues/concerns. - Anxiety depression. States he never began taking Hydroxyzine  because he didn't need it. States he never established with Psychiatry per his preference. He denies thoughts of self-harm, suicidal ideations, homicidal ideations.  Patient Active Problem List   Diagnosis Date Noted   Bilateral inguinal hernia without obstruction or gangrene 10/03/2023   Asthmatic bronchitis , chronic (HCC) 08/06/2023   Nasal septal deviation 01/22/2023   Personal history of nicotine  dependence 01/18/2023   Left inguinal hernia 10/10/2022   Prediabetes 04/14/2022   Dyspnea on exertion 04/12/2022   COPD (chronic obstructive pulmonary disease) with emphysema (HCC) 07/19/2021   Hyperlipidemia 05/05/2021   Rectal bleeding 11/15/2020   Acute bronchitis 08/30/2018   Spell of dizziness 03/14/2018   Viral upper respiratory illness 03/14/2018   Cough variant asthma 11/27/2017   Cough 06/27/2017   Rib pain 06/27/2017     Current Outpatient Medications on File Prior to Visit  Medication Sig Dispense Refill   cetirizine  (ZYRTEC ) 10 MG tablet Take 1 tablet (10 mg total) by mouth daily. 30 tablet 1   ipratropium-albuterol  (DUONEB) 0.5-2.5 (3) MG/3ML SOLN Take 3 mLs by nebulization every 12 (twelve) hours as needed. 360 mL 2   sildenafil  (REVATIO ) 20 MG tablet TAKE 2 TO 5 TABLETS BY MOUTH 1 TO 2 HOURS PRIOR TO SEXUAL ACTIVITY ON AN EMPTY STOMACH. 90 tablet 0   SUMAtriptan  (IMITREX ) 25 MG  tablet Take 25 mg (1 tablet total) by mouth at the start of the headache. May repeat in 2 hours x 1 if headache persists. Max of 2 tablets/24 hours. 30 tablet 2   acetaminophen  (TYLENOL ) 500 MG tablet Take 1,000 mg by mouth every 6 (six) hours as needed for mild pain (pain score 1-3) or moderate pain (pain score 4-6). (Patient not taking: Reported on 11/30/2023)     FLUoxetine  (PROZAC ) 10 MG tablet Take 1 tablet (10 mg total) by mouth daily. (Patient not taking: Reported on 03/26/2024) 90 tablet 0   ibuprofen  (ADVIL ) 600 MG tablet Take 1 tablet (600 mg total) by mouth every 8 (eight) hours as needed. (Patient taking differently: Take 600 mg by mouth every 8 (eight) hours as needed for mild pain (pain score 1-3) or moderate pain (pain score 4-6).) 21 tablet 0   oxyCODONE  (ROXICODONE ) 5 MG immediate release tablet Take 1 tablet (5 mg total) by mouth every 4 (four) hours as needed. (Patient taking differently: Take 5 mg by mouth every 4 (four) hours as needed for moderate pain (pain score 4-6) or severe pain (pain score 7-10).) 20 tablet 0   predniSONE  (DELTASONE ) 10 MG tablet 4 tabs for 2 days, then 3 tabs for 2 days, 2 tabs for 2 days, then 1 tab for 2 days, then stop (Patient not taking: Reported on 01/23/2024) 20 tablet 0   triamcinolone  (KENALOG ) 0.025 % ointment Apply 1 Application topically 2 (two) times daily. (Patient not taking: Reported on 11/30/2023) 60 g 0   No current facility-administered medications on file prior to visit.    No Known  Allergies  Social History   Socioeconomic History   Marital status: Divorced    Spouse name: Not on file   Number of children: Not on file   Years of education: Not on file   Highest education level: Not on file  Occupational History   Not on file  Tobacco Use   Smoking status: Former    Current packs/day: 0.00    Average packs/day: 0.3 packs/day for 30.0 years (7.5 ttl pk-yrs)    Types: Cigarettes    Start date: 12/17/1986    Quit date: 12/16/2016     Years since quitting: 7.4    Passive exposure: Never   Smokeless tobacco: Never  Vaping Use   Vaping status: Never Used  Substance and Sexual Activity   Alcohol use: No    Alcohol/week: 0.0 standard drinks of alcohol   Drug use: No   Sexual activity: Yes  Other Topics Concern   Not on file  Social History Narrative   Lives with son   Social Drivers of Health   Financial Resource Strain: Medium Risk (01/04/2024)   Overall Financial Resource Strain (CARDIA)    Difficulty of Paying Living Expenses: Somewhat hard  Food Insecurity: No Food Insecurity (01/04/2024)   Hunger Vital Sign    Worried About Running Out of Food in the Last Year: Never true    Ran Out of Food in the Last Year: Never true  Recent Concern: Food Insecurity - Food Insecurity Present (12/20/2023)   Hunger Vital Sign    Worried About Running Out of Food in the Last Year: Often true    Ran Out of Food in the Last Year: Sometimes true  Transportation Needs: No Transportation Needs (12/20/2023)   PRAPARE - Administrator, Civil Service (Medical): No    Lack of Transportation (Non-Medical): No  Physical Activity: Sufficiently Active (11/30/2023)   Exercise Vital Sign    Days of Exercise per Week: 5 days    Minutes of Exercise per Session: 30 min  Stress: No Stress Concern Present (11/30/2023)   [employer] of Occupational Health - Occupational Stress Questionnaire    Feeling of Stress : Not at all  Social Connections: Socially Isolated (11/30/2023)   Social Connection and Isolation Panel    Frequency of Communication with Friends and Family: Once a week    Frequency of Social Gatherings with Friends and Family: Once a week    Attends Religious Services: More than 4 times per year    Active Member of [employer] or Organizations: No    Attends Banker Meetings: Never    Marital Status: Divorced  Catering Manager Violence: Not At Risk (01/04/2024)   Humiliation, Afraid, Rape, and Kick questionnaire     Fear of Current or Ex-Partner: No    Emotionally Abused: No    Physically Abused: No    Sexually Abused: No    Family History  Problem Relation Age of Onset   Sudden Cardiac Death Brother 77   Colon cancer Neg Hx    Colon polyps Neg Hx    Esophageal cancer Neg Hx    Rectal cancer Neg Hx    Stomach cancer Neg Hx     Past Surgical History:  Procedure Laterality Date   HEMORRHOID SURGERY  1985   x2    ROS: Review of Systems Negative except as stated above  PHYSICAL EXAM: BP 130/77   Pulse 66   Temp 97.7 F (36.5 C) (Oral)   Resp 16  Ht 6' 3 (1.905 m)   Wt 191 lb 12.8 oz (87 kg)   SpO2 97%   BMI 23.97 kg/m   Physical Exam HENT:     Head: Normocephalic and atraumatic.     Nose: Nose normal.     Mouth/Throat:     Mouth: Mucous membranes are moist.     Pharynx: Oropharynx is clear.  Eyes:     Extraocular Movements: Extraocular movements intact.     Conjunctiva/sclera: Conjunctivae normal.     Pupils: Pupils are equal, round, and reactive to light.  Cardiovascular:     Rate and Rhythm: Normal rate and regular rhythm.     Pulses: Normal pulses.     Heart sounds: Normal heart sounds.  Pulmonary:     Effort: Pulmonary effort is normal.     Breath sounds: Normal breath sounds.  Musculoskeletal:        General: Normal range of motion.     Cervical back: Normal range of motion and neck supple.  Neurological:     General: No focal deficit present.     Mental Status: He is alert and oriented to person, place, and time.  Psychiatric:        Mood and Affect: Mood normal.        Behavior: Behavior normal.     ASSESSMENT AND PLAN: 1. Chronic obstructive pulmonary disease, unspecified COPD type (HCC) (Primary) 2. Asthmatic bronchitis , chronic (HCC) - Patient today in office with no cardiopulmonary/acute distress.  - Continue Albuterol  inhaler, Albuterol  nebulizer, and Budesonide -Glycopyrrolate-Formoterol  inhaler as prescribed. Counseled on medication  adherence/adverse effects.  - EKG and DG Chest for evaluation.  - Patient declined respiratory panel and additional screening.  - Referral to Pulmonology for evaluation/management.  - Follow-up with primary provider as scheduled.  - albuterol  (PROVENTIL ) (2.5 MG/3ML) 0.083% nebulizer solution; Take 3 mLs (2.5 mg total) by nebulization every 6 (six) hours as needed for wheezing or shortness of breath.  Dispense: 75 mL; Refill: 5 - albuterol  (VENTOLIN  HFA) 108 (90 Base) MCG/ACT inhaler; Inhale 1-2 puffs into the lungs every 6 (six) hours as needed for wheezing or shortness of breath.  Dispense: 8 g; Refill: 2 - budesonide -glycopyrrolate-formoterol  (BREZTRI  AEROSPHERE) 160-9-4.8 MCG/ACT AERO inhaler; Inhale 2 puffs into the lungs in the morning and at bedtime.  Dispense: 10.7 g; Refill: 11 - Ambulatory referral to Pulmonology - EKG 12-Lead - DG Chest 2 View; Future  3. Hyperlipidemia, unspecified hyperlipidemia type - Continue Atorvastatin  as prescribed. Counseled on medication adherence/adverse effects.  - Routine screening.  - Follow-up with primary provider as scheduled.  - atorvastatin  (LIPITOR) 80 MG tablet; Take 1 tablet (80 mg total) by mouth daily.  Dispense: 90 tablet; Refill: 0 - Lipid panel  4. Anxiety and depression - Patient denies thoughts of self-harm, suicidal ideations, homicidal ideations. - Continue Hydroxyzine  as prescribed. Counseled on medication adherence/adverse effects.  - Follow-up with primary provider as scheduled.  - hydrOXYzine  (VISTARIL ) 25 MG capsule; Take 1 capsule (25 mg total) by mouth every 8 (eight) hours as needed.  Dispense: 90 capsule; Refill: 1    Patient was given the opportunity to ask questions.  Patient verbalized understanding of the plan and was able to repeat key elements of the plan. Patient was given clear instructions to go to Emergency Department or return to medical center if symptoms don't improve, worsen, or new problems develop.The  patient verbalized understanding.   Orders Placed This Encounter  Procedures   DG Chest 2 View   Lipid  panel   Ambulatory referral to Pulmonology   EKG 12-Lead     Requested Prescriptions   Signed Prescriptions Disp Refills   albuterol  (PROVENTIL ) (2.5 MG/3ML) 0.083% nebulizer solution 75 mL 5    Sig: Take 3 mLs (2.5 mg total) by nebulization every 6 (six) hours as needed for wheezing or shortness of breath.   albuterol  (VENTOLIN  HFA) 108 (90 Base) MCG/ACT inhaler 8 g 2    Sig: Inhale 1-2 puffs into the lungs every 6 (six) hours as needed for wheezing or shortness of breath.   atorvastatin  (LIPITOR) 80 MG tablet 90 tablet 0    Sig: Take 1 tablet (80 mg total) by mouth daily.   budesonide -glycopyrrolate-formoterol  (BREZTRI  AEROSPHERE) 160-9-4.8 MCG/ACT AERO inhaler 10.7 g 11    Sig: Inhale 2 puffs into the lungs in the morning and at bedtime.   hydrOXYzine  (VISTARIL ) 25 MG capsule 90 capsule 1    Sig: Take 1 capsule (25 mg total) by mouth every 8 (eight) hours as needed.    Return for Follow-up as needed.  Greig JINNY Drones, NP

## 2024-05-12 NOTE — Progress Notes (Signed)
 Chest pain and cholesterol

## 2024-05-13 ENCOUNTER — Ambulatory Visit: Payer: Self-pay | Admitting: Family

## 2024-05-13 LAB — LIPID PANEL
Chol/HDL Ratio: 5 ratio (ref 0.0–5.0)
Cholesterol, Total: 196 mg/dL (ref 100–199)
HDL: 39 mg/dL — ABNORMAL LOW (ref >39)
LDL Chol Calc (NIH): 99 mg/dL (ref 0–99)
Triglycerides: 344 mg/dL — ABNORMAL HIGH (ref 0–149)
VLDL Cholesterol Cal: 58 mg/dL — ABNORMAL HIGH (ref 5–40)

## 2024-05-14 NOTE — Progress Notes (Incomplete)
  Cardiology Office Note:   Date:  05/14/2024  ID:  Jerry Steele, DOB February 15, 1966, MRN [id number] PCP: Lorren Greig PARAS, NP  [facility] Providers Cardiologist:  None { Chief Complaint: No chief complaint on file.     History of Present Illness:   Jerry Steele is a 58 y.o. male with a PMH of HLD, COPD, asthma, and prior tobacco use who presents as a new patient referral by Greig Lorren for the evaluation of HLD.  The patient has a longstanding history of HLD and is currently on treatment with atorvastatin  80 mg daily.  Despite adherence to this medication regiment***his most recent lipid profile is not at goal.  He had a CT scan of his chest without contrast in 2018 which showed aortic and coronary calcifications.   Past Medical History:  Diagnosis Date   Anginal pain    Arthritis of both hands    Asthma    COPD (chronic obstructive pulmonary disease) (HCC)    Dyspnea    Emphysema lung (HCC)    Former smoker    GERD (gastroesophageal reflux disease)    Hyperlipidemia    Pneumonia    Pre-diabetes    Prostate cancer (HCC)      Studies Reviewed:    EKG: ***           Risk Assessment/Calculations:   {Does this patient have ATRIAL FIBRILLATION?[phone]} No BP recorded.  {Refresh Note OR Click here to enter BP  :1}***        Physical Exam:     VS:  There were no vitals taken for this visit. ***    Wt Readings from Last 3 Encounters:  05/12/24 191 lb 12.8 oz (87 kg)  03/26/24 186 lb (84.4 kg)  01/23/24 184 lb (83.5 kg)     GEN: Well nourished, well developed, in no acute distress NECK: No JVD; No carotid bruits CARDIAC: ***RRR, no murmurs, rubs, gallops RESPIRATORY:  Clear to auscultation without rales, wheezing or rhonchi  ABDOMEN: Soft, non-tender, non-distended, normal bowel sounds EXTREMITIES:  Warm and well perfused, no edema; No deformity, 2+ radial pulses PSYCH: Normal mood and affect   Assessment & Plan Mixed hyperlipidemia - Zetia vs  PCSK9*** - Icosapent ethyl 2g twice daily Coronary artery calcification seen on CAT scan - CAC score      {Are you ordering a CV Procedure (e.g. stress test, cath, DCCV, TEE, etc)?   Press F2        :[id number]}   This note was written with the assistance of a dictation microphone or AI dictation software. Please excuse any typos or grammatical errors.   Signed, Georganna Archer, MD 05/14/2024 7:34 PM    [facility]

## 2024-05-14 NOTE — Assessment & Plan Note (Signed)
 Unclear what his LDL goal truly is based on his ASCVD risk.  His triglyceride goal should be less than 150.  I will get a calcium  score with his CCTA to better guide his lipid management.  In the interim, we will start icosapent ethyl to target his hypertriglyceridemia. - Icosapent ethyl 2g twice daily -Follow-up calcium  score results with CCTA - Lipid panel and LPA in 3 months

## 2024-05-15 ENCOUNTER — Ambulatory Visit
Payer: Self-pay | Attending: Student in an Organized Health Care Education/Training Program | Admitting: Student in an Organized Health Care Education/Training Program

## 2024-05-15 ENCOUNTER — Ambulatory Visit: Payer: Self-pay

## 2024-05-15 ENCOUNTER — Other Ambulatory Visit (HOSPITAL_COMMUNITY): Payer: Self-pay

## 2024-05-15 VITALS — BP 104/74 | HR 68 | Ht 72.0 in | Wt 191.0 lb

## 2024-05-15 DIAGNOSIS — I2510 Atherosclerotic heart disease of native coronary artery without angina pectoris: Secondary | ICD-10-CM

## 2024-05-15 DIAGNOSIS — E782 Mixed hyperlipidemia: Secondary | ICD-10-CM

## 2024-05-15 DIAGNOSIS — R072 Precordial pain: Secondary | ICD-10-CM

## 2024-05-15 LAB — BASIC METABOLIC PANEL WITH GFR
BUN/Creatinine Ratio: 15 (ref 9–20)
BUN: 15 mg/dL (ref 6–24)
CO2: 23 mmol/L (ref 20–29)
Calcium: 9.3 mg/dL (ref 8.7–10.2)
Chloride: 100 mmol/L (ref 96–106)
Creatinine, Ser: 1.03 mg/dL (ref 0.76–1.27)
Glucose: 82 mg/dL (ref 70–99)
Potassium: 4.2 mmol/L (ref 3.5–5.2)
Sodium: 137 mmol/L (ref 134–144)
eGFR: 84 mL/min/1.73 (ref >59)

## 2024-05-15 MED ORDER — IVABRADINE HCL 7.5 MG PO TABS
15.0000 mg | ORAL_TABLET | Freq: Once | ORAL | 0 refills | Status: AC
  Filled 2024-05-15: qty 2, 1d supply, fill #0

## 2024-05-15 MED ORDER — FENOFIBRATE 145 MG PO TABS: 145.0000 mg | ORAL_TABLET | Freq: Every day | ORAL | 3 refills | Status: AC

## 2024-05-15 MED ORDER — ICOSAPENT ETHYL 1 G PO CAPS
2.0000 g | ORAL_CAPSULE | Freq: Two times a day (BID) | ORAL | 3 refills | Status: DC
  Filled 2024-05-15: qty 180, 45d supply, fill #0

## 2024-05-15 NOTE — Patient Instructions (Addendum)
 Medication Instructions:  START Fenofibrate 145 mg daily (pt aware of change)  *If you need a refill on your cardiac medications before your next appointment, please call your pharmacy*  Lab Work in 3 MONTHS: LIPID PANEL LP(a)  BMP- TODAY  If you have labs (blood work) drawn today and your tests are completely normal, you will receive your results only by: MyChart Message (if you have MyChart) OR A paper copy in the mail If you have any lab test that is abnormal or we need to change your treatment, we will call you to review the results.  Testing/Procedures: Coronary CTA   Your physician has requested that you have cardiac CT. Cardiac computed tomography (CT) is a painless test that uses an x-ray machine to take clear, detailed pictures of your heart. For further information please visit [email or web address] Please follow instruction sheet as given.   Your cardiac CT will be scheduled at the below locations:   [facility] [address]  [place], [place] [address]  If scheduled at the [facility] at [address], please enter the parking lot using the [address] entrance and use the FREE valet service at the patient drop-off area. Enter the building and check-in with registration on the main floor.   Please follow these instructions carefully (unless otherwise directed):  An IV will be required for this test and Nitroglycerin will be given.  Hold all erectile dysfunction medications at least 3 days (72 hrs) prior to test. (Ie viagra , cialis, sildenafil , tadalafil, etc)   On the Night Before the Test: Be sure to Drink plenty of water . Do not consume any caffeinated/decaffeinated beverages or chocolate 12 hours prior to your test. Do not take any antihistamines 12 hours prior to your test. This is anything you would use for allergies.   On the Day of the Test: Drink plenty of water  until 1 hour prior to the test. Do not eat any  food 1 hour prior to test. You may take your regular medications prior to the test.  Take Ivabradine (Corlanor) 7.5 mg tablets: Take 2 tablets (15 mg total) by mouth once for 1 dose. Take 90-120 minutes prior to CT scan.  If you take Furosemide/Hydrochlorothiazide/Spironolactone/Chlorthalidone, please HOLD on the morning of the test. Patients who wear a continuous glucose monitor MUST remove the device prior to scanning.      After the Test: Drink plenty of water . After receiving IV contrast, you may experience a mild flushed feeling. This is normal. On occasion, you may experience a mild rash up to 24 hours after the test. This is not dangerous. If this occurs, you can take Benadryl 25 mg, Zyrtec , Claritin, or Allegra and increase your fluid intake. (Patients taking Tikosyn should avoid Benadryl, and may take Zyrtec , Claritin, or Allegra) If you experience trouble breathing, this can be serious. If it is severe call 911 IMMEDIATELY. If it is mild, please call our office.  We will call to schedule your test 2-4 weeks out understanding that some insurance companies will need an authorization prior to the service being performed.   For more information and frequently asked questions, please visit our website : [email or web address]  For non-scheduling related questions, please contact the cardiac imaging nurse navigator should you have any questions/concerns: Cardiac Imaging Nurse Navigators Direct Office Dial: [phone]   For scheduling needs, including cancellations and rescheduling, please call Brittany, [phone].   Follow-Up: At [facility], you and your health needs are our  priority.  As part of our continuing mission to provide you with exceptional heart care, our providers are all part of one team.  This team includes your primary Cardiologist (physician) and Advanced Practice Providers or APPs (Physician Assistants and Nurse Practitioners) who all work  together to provide you with the care you need, when you need it.  Your next appointment:   3 month(s) (AFTER LABS)  Provider:   Georganna Archer, MD

## 2024-05-15 NOTE — Addendum Note (Signed)
 Addended by: MANDA BOTTCHER B on: 05/15/2024 05:05 PM   Modules accepted: Orders

## 2024-05-16 ENCOUNTER — Telehealth: Payer: Self-pay | Admitting: Student in an Organized Health Care Education/Training Program

## 2024-05-16 ENCOUNTER — Ambulatory Visit: Payer: Self-pay | Admitting: Student in an Organized Health Care Education/Training Program

## 2024-05-16 NOTE — Telephone Encounter (Signed)
 Pt c/o medication issue:  1. Name of Medication: fenofibrate (TRICOR) 145 MG tablet   2. How are you currently taking this medication (dosage and times per day)?   3. Are you having a reaction (difficulty breathing--STAT)? No  4. What is your medication issue? Pt states that he was told that medication would be for him to take twice daily. Instructions say once daily. Pt would like a c/b to clarify. Please advise

## 2024-05-16 NOTE — Telephone Encounter (Signed)
 Spoke with pt and advised per Dr Floretta Fenofibrate 145mg  is to be taken once daily.  Pt verbalizes understanding and agrees with current plan.

## 2024-05-26 ENCOUNTER — Ambulatory Visit: Payer: Self-pay | Admitting: Internal Medicine

## 2024-06-02 ENCOUNTER — Encounter (HOSPITAL_COMMUNITY): Payer: Self-pay

## 2024-06-04 ENCOUNTER — Ambulatory Visit (HOSPITAL_COMMUNITY): Admission: RE | Admit: 2024-06-04 | Payer: Self-pay

## 2024-06-15 ENCOUNTER — Other Ambulatory Visit: Payer: Self-pay | Admitting: Urology

## 2024-06-15 DIAGNOSIS — N529 Male erectile dysfunction, unspecified: Secondary | ICD-10-CM

## 2024-06-17 ENCOUNTER — Ambulatory Visit (HOSPITAL_COMMUNITY): Admission: RE | Admit: 2024-06-17 | Payer: Self-pay | Source: Ambulatory Visit

## 2024-06-30 ENCOUNTER — Other Ambulatory Visit (HOSPITAL_COMMUNITY): Payer: Self-pay

## 2024-06-30 NOTE — Telephone Encounter (Signed)
 PT came in this evening asking about a med and would like for the nurse to call!

## 2024-07-03 ENCOUNTER — Ambulatory Visit (HOSPITAL_COMMUNITY): Payer: PRIVATE HEALTH INSURANCE

## 2024-07-23 ENCOUNTER — Encounter: Payer: Self-pay | Admitting: Internal Medicine

## 2024-07-23 ENCOUNTER — Ambulatory Visit: Payer: Self-pay | Admitting: Internal Medicine

## 2024-07-23 NOTE — Progress Notes (Deleted)
 "  Jerry Steele, male    DOB: [birth year]   MRN: [id number]   Brief patient profile:  59 yo Italian /mexican male quit smoking in 2018   Dr  Jerry Steele then [facility] pt   self- referred to pulmonary clinic 08/06/2023  or cough and sob       Note PFTS in 05/2017 s significant airflow obstruction   History of Present Illness  08/06/2023  Pulmonary/ 1st office eval/Jerry Steele  Chief Complaint  Patient presents with   Consult  4 months prior to OV  better for several weeks p rx prednisone  and downhill since then, very confused with all of his meds and doesn't recognize meds just prescribed on list from the last month prior to OV  so really not clear what he is and is not taking   Dyspnea:  MMRC2 = can't walk a nl pace on a flat grade s sob but does fine slow and flat  Cough: min clear . Very harsh  SABA use: using nebs qid not prn  Rec Restart Albuterol  inhaler 2 puffs or 3 mL neb every 6 hours as needed for shortness of breath or wheezing. Notify if symptoms persist despite rescue inhaler/neb use.  Restart Breztri  2 puffs Twice daily. Brush tongue and rinse mouth afterwards  Continue cetirizine  (Zyrtec ) 1 tab daily Prednisone  taper. 4 tabs for 2 days, then 3 tabs for 2 days, 2 tabs for 2 days, then 1 tab for 2 days, then stop. Take in AM with food  Steroid shot today    07/23/2024  f/u ov/Jerry Steele re: ***   maint on ***  No chief complaint on file.   Dyspnea:  *** Cough: *** Sleeping: *** resp cc  SABA use: *** 02: ***  Lung cancer screening :  ***    No obvious day to day or daytime variability or assoc excess/ purulent sputum or mucus plugs or hemoptysis or cp or chest tightness, subjective wheeze or overt sinus or hb symptoms.    Also denies any obvious fluctuation of symptoms with weather or environmental changes or other aggravating or alleviating factors except as outlined above   No unusual exposure hx or h/o childhood pna/ asthma or knowledge of premature birth.  Current Allergies,  Complete Past Medical History, Past Surgical History, Family History, and Social History were reviewed in [employer] record.  ROS  The following are not active complaints unless bolded Hoarseness, sore throat, dysphagia, dental problems, itching, sneezing,  nasal congestion or discharge of excess mucus or purulent secretions, ear ache,   fever, chills, sweats, unintended wt loss or wt gain, classically pleuritic or exertional cp,  orthopnea pnd or arm/hand swelling  or leg swelling, presyncope, palpitations, abdominal pain, anorexia, nausea, vomiting, diarrhea  or change in bowel habits or change in bladder habits, change in stools or change in urine, dysuria, hematuria,  rash, arthralgias, visual complaints, headache, numbness, weakness or ataxia or problems with walking or coordination,  change in mood or  memory.          Outpatient Medications Prior to Visit  Medication Sig Dispense Refill   acetaminophen  (TYLENOL ) 500 MG tablet Take 1,000 mg by mouth every 6 (six) hours as needed for mild pain (pain score 1-3) or moderate pain (pain score 4-6).     albuterol  (PROVENTIL ) (2.5 MG/3ML) 0.083% nebulizer solution Take 3 mLs (2.5 mg total) by nebulization every 6 (six) hours as needed for wheezing or shortness of breath. 75 mL 5   albuterol  (  VENTOLIN  HFA) 108 (90 Base) MCG/ACT inhaler Inhale 1-2 puffs into the lungs every 6 (six) hours as needed for wheezing or shortness of breath. 8 g 2   atorvastatin  (LIPITOR) 80 MG tablet Take 1 tablet (80 mg total) by mouth daily. 90 tablet 0   budesonide -glycopyrrolate-formoterol  (BREZTRI  AEROSPHERE) 160-9-4.8 MCG/ACT AERO inhaler Inhale 2 puffs into the lungs in the morning and at bedtime. 10.7 g 11   cetirizine  (ZYRTEC ) 10 MG tablet Take 1 tablet (10 mg total) by mouth daily. 30 tablet 1   fenofibrate  (TRICOR ) 145 MG tablet Take 1 tablet (145 mg total) by mouth daily. 90 tablet 3   FLUoxetine  (PROZAC ) 10 MG tablet Take 1 tablet (10 mg  total) by mouth daily. (Patient not taking: Reported on 05/15/2024) 90 tablet 0   hydrOXYzine  (VISTARIL ) 25 MG capsule Take 1 capsule (25 mg total) by mouth every 8 (eight) hours as needed. 90 capsule 1   ibuprofen  (ADVIL ) 600 MG tablet Take 1 tablet (600 mg total) by mouth every 8 (eight) hours as needed. (Patient not taking: Reported on 05/15/2024) 21 tablet 0   ipratropium-albuterol  (DUONEB) 0.5-2.5 (3) MG/3ML SOLN Take 3 mLs by nebulization every 12 (twelve) hours as needed. 360 mL 2   oxyCODONE  (ROXICODONE ) 5 MG immediate release tablet Take 1 tablet (5 mg total) by mouth every 4 (four) hours as needed. (Patient taking differently: Take 5 mg by mouth every 4 (four) hours as needed for moderate pain (pain score 4-6) or severe pain (pain score 7-10).) 20 tablet 0   predniSONE  (DELTASONE ) 10 MG tablet 4 tabs for 2 days, then 3 tabs for 2 days, 2 tabs for 2 days, then 1 tab for 2 days, then stop (Patient not taking: Reported on 05/15/2024) 20 tablet 0   sildenafil  (REVATIO ) 20 MG tablet TAKE 2-5 TABLETS BY MOUTH ONE TO TWO HOURS PRIOR TO SEXUAL ACTIVITY ON AN EMPTY STOMACH 90 tablet 1   SUMAtriptan  (IMITREX ) 25 MG tablet Take 25 mg (1 tablet total) by mouth at the start of the headache. May repeat in 2 hours x 1 if headache persists. Max of 2 tablets/24 hours. 30 tablet 2   triamcinolone  (KENALOG ) 0.025 % ointment Apply 1 Application topically 2 (two) times daily. (Patient not taking: Reported on 05/15/2024) 60 g 0   No facility-administered medications prior to visit.       Past Medical History:  Diagnosis Date   Anginal pain (HCC)    Arthritis of both hands    Asthma    COPD (chronic obstructive pulmonary disease) (HCC)    Dyspnea    Emphysema lung (HCC)    Former smoker    GERD (gastroesophageal reflux disease)    Hyperlipidemia    Pneumonia    Pre-diabetes    Prostate cancer (HCC)       Objective:     Wt Readings from Last 3 Encounters:  05/15/24 191 lb (86.6 kg)  05/12/24  191 lb 12.8 oz (87 kg)  03/26/24 186 lb (84.4 kg)      Vital signs reviewed  07/23/2024  - Note at rest 02 sats  ***% on ***   General appearance:    ***          Assessment       "

## 2024-08-09 ENCOUNTER — Other Ambulatory Visit: Payer: Self-pay | Admitting: Family

## 2024-08-09 DIAGNOSIS — E785 Hyperlipidemia, unspecified: Secondary | ICD-10-CM

## 2024-08-11 NOTE — Telephone Encounter (Signed)
 Requested medication (s) are due for refill today: expired medication  Requested medication (s) are on the active medication list: yes   Last refill:  05/14/24- 1 25/26 #90 0 refills   Future visit scheduled: no   Notes to clinic:   expired medication . Do you want to renew Rx?     Requested Prescriptions  Pending Prescriptions Disp Refills   atorvastatin  (LIPITOR) 80 MG tablet [Pharmacy Med Name: Atorvastatin  Calcium  80 MG Oral Tablet] 90 tablet 0    Sig: Take 1 tablet by mouth once daily     Cardiovascular:  Antilipid - Statins Failed - 08/11/2024 12:21 PM      Failed - Lipid Panel in normal range within the last 12 months    Cholesterol, Total  Date Value Ref Range Status  05/12/2024 196 100 - 199 mg/dL Final   LDL Chol Calc (NIH)  Date Value Ref Range Status  05/12/2024 99 0 - 99 mg/dL Final   HDL  Date Value Ref Range Status  05/12/2024 39 (L) >39 mg/dL Final   Triglycerides  Date Value Ref Range Status  05/12/2024 344 (H) 0 - 149 mg/dL Final         Passed - Patient is not pregnant      Passed - Valid encounter within last 12 months    Recent Outpatient Visits           3 months ago Chronic obstructive pulmonary disease, unspecified COPD type (HCC)   [facility], [place], NP   6 months ago Giddiness   [facility], Amy J, NP   8 months ago Migraine without status migrainosus, not intractable, unspecified migraine type   [facility], Amy J, NP   12 months ago Low back pain, unspecified back pain laterality, unspecified chronicity, unspecified whether sciatica present   [facility], Amy J, NP   1 year ago Elevated blood pressure reading in office without diagnosis of hypertension   [facility], MD       Future Appointments             In 2 weeks Floretta Mallard, MD [facility], H&V   In 2 months [place], Adine PARAS., MD [facility] at [facility]

## 2024-08-14 ENCOUNTER — Other Ambulatory Visit: Payer: Self-pay | Admitting: Urology

## 2024-08-14 ENCOUNTER — Encounter (HOSPITAL_COMMUNITY): Payer: Self-pay

## 2024-08-14 DIAGNOSIS — N529 Male erectile dysfunction, unspecified: Secondary | ICD-10-CM

## 2024-08-18 ENCOUNTER — Ambulatory Visit (HOSPITAL_COMMUNITY): Payer: PRIVATE HEALTH INSURANCE

## 2024-08-25 ENCOUNTER — Ambulatory Visit: Payer: Self-pay | Admitting: Student in an Organized Health Care Education/Training Program

## 2024-11-07 ENCOUNTER — Ambulatory Visit: Admitting: Urology
